# Patient Record
Sex: Male | Born: 1955
Health system: Southern US, Community
[De-identification: ages and names within clinical notes are randomized; demographics above are authoritative.]

## PROBLEM LIST (undated history)

## (undated) DIAGNOSIS — I639 Cerebral infarction, unspecified: Secondary | ICD-10-CM

## (undated) DIAGNOSIS — T4145XA Adverse effect of unspecified anesthetic, initial encounter: Secondary | ICD-10-CM

## (undated) DIAGNOSIS — G4730 Sleep apnea, unspecified: Secondary | ICD-10-CM

## (undated) DIAGNOSIS — J45909 Unspecified asthma, uncomplicated: Secondary | ICD-10-CM

## (undated) DIAGNOSIS — M1990 Unspecified osteoarthritis, unspecified site: Secondary | ICD-10-CM

## (undated) DIAGNOSIS — Z8719 Personal history of other diseases of the digestive system: Secondary | ICD-10-CM

## (undated) DIAGNOSIS — I10 Essential (primary) hypertension: Secondary | ICD-10-CM

## (undated) HISTORY — PX: ROTATOR CUFF REPAIR: SHX139

## (undated) HISTORY — DX: Cerebral infarction, unspecified: I63.9

## (undated) HISTORY — PX: WISDOM TOOTH EXTRACTION: SHX21

## (undated) HISTORY — DX: Essential (primary) hypertension: I10

---

## 2000-01-26 HISTORY — PX: NASAL SINUS SURGERY: SHX719

## 2000-08-30 ENCOUNTER — Encounter: Admission: RE | Admit: 2000-08-30 | Discharge: 2000-08-30 | Payer: Self-pay | Admitting: Family Medicine

## 2000-08-30 ENCOUNTER — Encounter: Payer: Self-pay | Admitting: Family Medicine

## 2000-09-20 ENCOUNTER — Encounter: Payer: Self-pay | Admitting: Family Medicine

## 2000-09-20 ENCOUNTER — Encounter: Admission: RE | Admit: 2000-09-20 | Discharge: 2000-09-20 | Payer: Self-pay | Admitting: Family Medicine

## 2000-11-10 ENCOUNTER — Ambulatory Visit (HOSPITAL_COMMUNITY): Admission: RE | Admit: 2000-11-10 | Discharge: 2000-11-10 | Payer: Self-pay | Admitting: Family Medicine

## 2000-11-10 ENCOUNTER — Encounter: Payer: Self-pay | Admitting: Family Medicine

## 2001-07-04 ENCOUNTER — Encounter: Payer: Self-pay | Admitting: Otolaryngology

## 2001-07-04 ENCOUNTER — Encounter: Admission: RE | Admit: 2001-07-04 | Discharge: 2001-07-04 | Payer: Self-pay | Admitting: Otolaryngology

## 2001-07-06 ENCOUNTER — Ambulatory Visit (HOSPITAL_BASED_OUTPATIENT_CLINIC_OR_DEPARTMENT_OTHER): Admission: RE | Admit: 2001-07-06 | Discharge: 2001-07-06 | Payer: Self-pay | Admitting: Otolaryngology

## 2001-07-07 ENCOUNTER — Emergency Department (HOSPITAL_COMMUNITY): Admission: EM | Admit: 2001-07-07 | Discharge: 2001-07-07 | Payer: Self-pay | Admitting: Emergency Medicine

## 2004-10-20 ENCOUNTER — Ambulatory Visit: Payer: Self-pay | Admitting: Gastroenterology

## 2004-10-28 ENCOUNTER — Ambulatory Visit: Payer: Self-pay | Admitting: Gastroenterology

## 2004-11-17 ENCOUNTER — Ambulatory Visit: Payer: Self-pay | Admitting: Gastroenterology

## 2004-12-04 ENCOUNTER — Ambulatory Visit: Payer: Self-pay | Admitting: Gastroenterology

## 2005-05-26 ENCOUNTER — Ambulatory Visit: Payer: Self-pay

## 2005-09-24 ENCOUNTER — Encounter: Admission: RE | Admit: 2005-09-24 | Discharge: 2005-09-24 | Payer: Self-pay | Admitting: Family Medicine

## 2009-01-30 DIAGNOSIS — G43909 Migraine, unspecified, not intractable, without status migrainosus: Secondary | ICD-10-CM | POA: Insufficient documentation

## 2009-01-30 DIAGNOSIS — I10 Essential (primary) hypertension: Secondary | ICD-10-CM | POA: Insufficient documentation

## 2009-01-30 DIAGNOSIS — G4733 Obstructive sleep apnea (adult) (pediatric): Secondary | ICD-10-CM | POA: Insufficient documentation

## 2012-02-18 ENCOUNTER — Other Ambulatory Visit: Payer: Self-pay | Admitting: Internal Medicine

## 2012-02-18 ENCOUNTER — Encounter: Payer: Self-pay | Admitting: Gastroenterology

## 2012-02-18 DIAGNOSIS — E669 Obesity, unspecified: Secondary | ICD-10-CM | POA: Insufficient documentation

## 2012-02-18 DIAGNOSIS — R1310 Dysphagia, unspecified: Secondary | ICD-10-CM

## 2012-02-22 ENCOUNTER — Other Ambulatory Visit (HOSPITAL_COMMUNITY): Payer: Self-pay | Admitting: Internal Medicine

## 2012-02-22 DIAGNOSIS — R1310 Dysphagia, unspecified: Secondary | ICD-10-CM

## 2012-02-24 ENCOUNTER — Ambulatory Visit (HOSPITAL_COMMUNITY)
Admission: RE | Admit: 2012-02-24 | Discharge: 2012-02-24 | Disposition: A | Discharge status: Home / Self Care | Payer: BC Managed Care – PPO | Source: Ambulatory Visit | Attending: Internal Medicine | Admitting: Internal Medicine

## 2012-02-24 ENCOUNTER — Other Ambulatory Visit: Payer: Self-pay

## 2012-02-24 DIAGNOSIS — R1310 Dysphagia, unspecified: Secondary | ICD-10-CM | POA: Insufficient documentation

## 2012-02-24 NOTE — Procedures (Signed)
Objective Swallowing Evaluation: Modified Barium Swallowing Study  Patient Details  Name: Tyler [name] MRN: [id number] Date of Birth: 08/06/1955  Today's Date: 02/24/2012 Time: 1400-1420 SLP Time Calculation (min): 20 min  Past Medical History: No past medical history on file. Past Surgical History: No past surgical history on file. HPI:  57 year old male referred by primary care physician for swallow evaluation due to c/o globus for approximately 4 months. Patient recently started on Nexium by primary care physician.      Assessment / Plan / Recommendation Clinical Impression  Dysphagia Diagnosis: Suspected primary esophageal dysphagia Clinical impression: Patient presents with a suspected primary esophageal dysphagia resulting in globus sensation despite what is a normal oropharyngeal swallow with all consistencies tested during today's study. Full airway protection and clearance of the pharynx noted. Defer further w/u to primary care physician or GI MD who patient states he has already made an appointment with. Noted a barium swallow ordered however per patient, has not yet been completed. Educated patient regarding variations between testing, rationale for testing to r/o possible dysfunction, and general safe swallowing precautions.     Treatment Recommendation  No treatment recommended at this time    Diet Recommendation Regular;Thin liquid   Liquid Administration via: Cup;Straw Medication Administration: Whole meds with liquid Supervision: Patient able to self feed Compensations: Slow rate;Small sips/bites Postural Changes and/or Swallow Maneuvers: Seated upright 90 degrees;Upright 30-60 min after meal    Other  Recommendations Recommended Consults: Consider GI evaluation;Consider esophageal assessment Oral Care Recommendations: Oral care BID   Follow Up Recommendations  None       Pertinent Vitals/Pain None reported        General HPI: 57 year old male referred  by primary care physician for swallow evaluation due to c/o globus for approximately 4 months. Patient recently started on Nexium by primary care physician.  Type of Study: Modified Barium Swallowing Study Reason for Referral: Objectively evaluate swallowing function Previous Swallow Assessment: none Diet Prior to this Study: Regular;Thin liquids Temperature Spikes Noted: No Respiratory Status: Room air History of Recent Intubation: No Behavior/Cognition: Alert;Cooperative;Pleasant mood Oral Cavity - Dentition: Adequate natural dentition Oral Motor / Sensory Function: Within functional limits Self-Feeding Abilities: Able to feed self Patient Positioning: Upright in chair Baseline Vocal Quality: Clear Volitional Cough: Strong Volitional Swallow: Able to elicit Anatomy: Within functional limits Pharyngeal Secretions: Not observed secondary MBS    Reason for Referral Objectively evaluate swallowing function   Oral Phase Oral Preparation/Oral Phase Oral Phase: WFL   Pharyngeal Phase Pharyngeal Phase Pharyngeal Phase: Within functional limits  Cervical Esophageal Phase    GO    Cervical Esophageal Phase Cervical Esophageal Phase: [facility]    Functional Assessment Tool Used: skilled clinician judgement Functional Limitations: Swallowing Swallow Current Status (W0981): 0 percent impaired, limited or restricted Swallow Goal Status (X9147): 0 percent impaired, limited or restricted Swallow Discharge Status [phone]): 0 percent impaired, limited or restricted   Ferdinand Lango MA, CCC-SLP [id number]  Tyler [name] 02/24/2012, 2:31 PM

## 2012-03-20 ENCOUNTER — Ambulatory Visit (INDEPENDENT_AMBULATORY_CARE_PROVIDER_SITE_OTHER): Payer: BC Managed Care – PPO | Admitting: Gastroenterology

## 2012-03-20 ENCOUNTER — Encounter: Payer: Self-pay | Admitting: Gastroenterology

## 2012-03-20 VITALS — BP 136/88 | HR 72 | Ht 70.0 in | Wt 228.4 lb

## 2012-03-20 DIAGNOSIS — R1310 Dysphagia, unspecified: Secondary | ICD-10-CM | POA: Insufficient documentation

## 2012-03-20 DIAGNOSIS — R195 Other fecal abnormalities: Secondary | ICD-10-CM

## 2012-03-20 NOTE — Assessment & Plan Note (Signed)
The patient undoubtedly has a esophageal stricture, most likely peptic.  Recommendations #1 upper endoscopy with dilatation as indicated #2 continue Nexium  Risks, alternatives, and complications of the procedure, including bleeding, perforation, and possible need for surgery, were explained to the patient.  Patient's questions were answered.

## 2012-03-20 NOTE — Assessment & Plan Note (Signed)
Hemoccult-positive stool could be related to active peptic disease. Lower GI bleeding source from polyps, AVMs or neoplasm are other considerations.  Recommendations #1 if upper endoscopy is not diagnostic for a GI bleeding source we will schedule colonoscopy #2 review recent CBC

## 2012-03-20 NOTE — Progress Notes (Signed)
History of Present Illness: Pleasant 57 year old white male referred at the request of Dr. Neale Burly for evaluation of dysphagia and Hemoccult-positive stool. Dysphagia has been an ongoing problem for 4-5 months. He's had what sounds like transient food impaction. With dysphagia he develops chest discomfort. Symptoms have slightly improved after starting Nexium but remain. Recent swallowing evaluation was negative. He has rare pyrosis.  Patient also tested Hemoccult positive. There is no history of melena or hematochezia.  Colonoscopy in 2006 for an iron deficiency anemia was negative.    Past Medical History  Diagnosis Date  . Hypertension    Past Surgical History  Procedure Laterality Date  . Wisdom tooth extraction    . Rotator cuff repair    . Nasal sinus surgery  2002   family history includes Aneurysm in his father and Heart failure in his mother. Current Outpatient Prescriptions  Medication Sig Dispense Refill  . amLODipine (NORVASC) 10 MG tablet Take 10 mg by mouth daily.      Marland Kitchen aspirin 81 MG tablet Take 81 mg by mouth daily.      Marland Kitchen esomeprazole (NEXIUM) 40 MG capsule Take 40 mg by mouth daily before breakfast.      . ferrous sulfate 325 (65 FE) MG tablet Take 325 mg by mouth daily with breakfast.      . fluticasone (VERAMYST) 27.5 MCG/SPRAY nasal spray Place 2 sprays into the nose daily.      Marland Kitchen lisinopril-hydrochlorothiazide (PRINZIDE,ZESTORETIC) 20-12.5 MG per tablet Take 1 tablet by mouth daily.      . Omega-3 Fatty Acids (FISH OIL) 1200 MG CAPS Take 2 capsules by mouth 2 (two) times daily.      . sildenafil (VIAGRA) 50 MG tablet Take 50 mg by mouth daily as needed for erectile dysfunction.      . Tamsulosin HCl (FLOMAX) 0.4 MG CAPS Take 0.4 mg by mouth daily.       No current facility-administered medications for this visit.   Allergies as of 03/20/2012  . (No Known Allergies)    reports that he has never smoked. He has never used smokeless tobacco. He reports that  drinks  alcohol. He reports that he does not use illicit drugs.     Review of Systems: Pertinent positive and negative review of systems were noted in the above HPI section. All other review of systems were otherwise negative.  Vital signs were reviewed in today's medical record Physical Exam: General: Well developed , well nourished, no acute distress Skin: anicteric Head: Normocephalic and atraumatic Eyes:  sclerae anicteric, EOMI Ears: Normal auditory acuity Mouth: No deformity or lesions Neck: Supple, no masses or thyromegaly Lungs: Clear throughout to auscultation Heart: Regular rate and rhythm; no murmurs, rubs or bruits Abdomen: Soft, non tender and non distended. No masses, hepatosplenomegaly or hernias noted. Normal Bowel sounds Rectal:deferred Musculoskeletal: Symmetrical with no gross deformities  Skin: No lesions on visible extremities Pulses:  Normal pulses noted Extremities: No clubbing, cyanosis, edema or deformities noted Neurological: Alert oriented x 4, grossly nonfocal Cervical Nodes:  No significant cervical adenopathy Inguinal Nodes: No significant inguinal adenopathy Psychological:  Alert and cooperative. Normal mood and affect

## 2012-03-20 NOTE — Patient Instructions (Addendum)
You have been scheduled for an endoscopy with propofol. Please follow written instructions given to you at your visit today. If you use inhalers (even only as needed) or a CPAP machine, please bring them with you on the day of your procedure. 

## 2012-04-11 ENCOUNTER — Encounter: Payer: BC Managed Care – PPO | Admitting: Gastroenterology

## 2012-04-13 ENCOUNTER — Telehealth: Payer: Self-pay | Admitting: *Deleted

## 2012-04-13 ENCOUNTER — Ambulatory Visit (AMBULATORY_SURGERY_CENTER): Payer: BC Managed Care – PPO | Admitting: Gastroenterology

## 2012-04-13 ENCOUNTER — Encounter: Payer: Self-pay | Admitting: Gastroenterology

## 2012-04-13 VITALS — BP 159/92 | HR 69 | Temp 97.6°F | Resp 21 | Ht 70.0 in | Wt 228.0 lb

## 2012-04-13 DIAGNOSIS — K222 Esophageal obstruction: Secondary | ICD-10-CM

## 2012-04-13 DIAGNOSIS — R1310 Dysphagia, unspecified: Secondary | ICD-10-CM

## 2012-04-13 MED ORDER — SODIUM CHLORIDE 0.9 % IV SOLN: 500.0000 mL | INTRAVENOUS | Status: DC

## 2012-04-13 NOTE — Op Note (Signed)
[facility] [address] [place], [address]   ENDOSCOPY PROCEDURE REPORT  PATIENT: Tyler [name], Tyler [name]  MR#: [id number] BIRTHDATE: 1955/05/14 , 56  yrs. old GENDER: Male ENDOSCOPIST: Louis Meckel, MD REFERRED BY:  Guerry Bruin, M.D. PROCEDURE DATE:  04/13/2012 PROCEDURE:  EGD, diagnostic and Maloney dilation of esophagus ASA CLASS:     Class II INDICATIONS:  Dysphagia.   Heme positive stool. MEDICATIONS: MAC sedation, administered by CRNA, propofol (Diprivan) 150mg  IV, and Simethicone 0.6cc PO TOPICAL ANESTHETIC: Cetacaine Spray  DESCRIPTION OF PROCEDURE: After the risks benefits and alternatives of the procedure were thoroughly explained, informed consent was obtained.  The [facility] [id number] endoscope was introduced through the mouth and advanced to the third portion of the duodenum. Without limitations.  The instrument was slowly withdrawn as the mucosa was fully examined.      At the GE junction there was an early esophageal stricture.  The 9.8 mm gastroscope traversed the area without resistance.  There was a small 2-3 cm sliding hiatal hernia.   The remainder of the upper endoscopy exam was otherwise normal.         The scope was then withdrawn from the patient.  A #52 Jerene Dilling dilator was passed with mild resistance. There was no heme.  COMPLICATIONS: There were no complications.  ENDOSCOPIC IMPRESSION: esophageal stricture-status post [facility] dilation  RECOMMENDATIONS: 1.  Repeat dilatation when necessary 2.  colonoscopy for workup of Hemoccult-positive stool REPEAT EXAM:  eSigned:  Louis Meckel, MD 04/13/2012 2:11 PM   CC:

## 2012-04-13 NOTE — Progress Notes (Signed)
Patient did not have preoperative order for IV antibiotic SSI prophylaxis. (G8918)  Patient did not experience any of the following events: a burn prior to discharge; a fall within the facility; wrong site/side/patient/procedure/implant event; or a hospital transfer or hospital admission upon discharge from the facility. (G8907)  

## 2012-04-13 NOTE — Telephone Encounter (Signed)
Left message for patient on both home and work phones asking if we might move appointment for EGD forward this afternoon. Requesting that patient arrive at 130 pm instead of 200 pm. No answer.

## 2012-04-13 NOTE — Progress Notes (Signed)
Report to pacu rn, vss, bbs=clear 

## 2012-04-13 NOTE — Patient Instructions (Addendum)

## 2012-04-14 ENCOUNTER — Telehealth: Payer: Self-pay | Admitting: *Deleted

## 2012-04-14 NOTE — Telephone Encounter (Signed)
  Follow up Call-  Call back number 04/13/2012  Post procedure Call Back phone  # [phone]  Permission to leave phone message Yes     Patient questions:  Do you have a fever, pain , or abdominal swelling? no Pain Score  0 *  Have you tolerated food without any problems? yes  Have you been able to return to your normal activities? yes  Do you have any questions about your discharge instructions: Diet   no Medications  no Follow up visit  no  Do you have questions or concerns about your Care? no  Actions: * If pain score is 4 or above: No action needed, pain <4.

## 2012-04-20 ENCOUNTER — Telehealth: Payer: Self-pay

## 2012-04-20 NOTE — Telephone Encounter (Signed)
Please see note below. Colonoscopy was not scheduled at the time of EGD, pt is supposed to call back and schedule that appt.

## 2012-04-20 NOTE — Telephone Encounter (Signed)
Message copied by Chrystie Nose on Thu Apr 20, 2012 10:42 AM ------      Message from: Jannifer Franklin      Created: Wed Apr 19, 2012  4:26 PM      Regarding: RE: Colonoscopy       Bonita Quin,            I reviewed this patient's AVS to see what the pt was told post procedure. The recovery nurse documented on the AVS that the patient was to call in May to schedule June Previsit and Procedure.  I spoke with the recovery nurse to find out why the patient was to call back.  The pt had already lost days to this episode and procedure and did not want to commit to a date before school was out.  He wanted to check his school calendar before making the appt.              Thanks for the follow up.      Harriett Sine                        ----- Message -----         From: Lily Lovings, RN         Sent: 04/17/2012  10:59 AM           To: Jannifer Franklin, RN      Subject: Colonoscopy                                              Harriett Sine,            This pt had an EGD on 04/13/12 and per report he was supposed to be scheduled for a colon and was not.            Thanks,      Bonita Quin       ------

## 2012-05-19 ENCOUNTER — Encounter: Payer: Self-pay | Admitting: Gastroenterology

## 2012-06-23 ENCOUNTER — Encounter: Payer: Self-pay | Admitting: Gastroenterology

## 2012-07-05 ENCOUNTER — Ambulatory Visit (AMBULATORY_SURGERY_CENTER): Payer: BC Managed Care – PPO | Admitting: *Deleted

## 2012-07-05 ENCOUNTER — Encounter: Payer: Self-pay | Admitting: Gastroenterology

## 2012-07-05 VITALS — Ht 70.0 in | Wt 232.2 lb

## 2012-07-05 DIAGNOSIS — K921 Melena: Secondary | ICD-10-CM

## 2012-07-05 MED ORDER — NA SULFATE-K SULFATE-MG SULF 17.5-3.13-1.6 GM/177ML PO SOLN: 1.0000 | Freq: Once | ORAL | Status: DC

## 2012-07-05 NOTE — Progress Notes (Signed)
Denies allergies to eggs or soy products. Denies complications with sedation or anesthesia. 

## 2012-08-03 ENCOUNTER — Ambulatory Visit (AMBULATORY_SURGERY_CENTER): Payer: BC Managed Care – PPO | Admitting: Gastroenterology

## 2012-08-03 ENCOUNTER — Encounter: Payer: Self-pay | Admitting: Gastroenterology

## 2012-08-03 VITALS — BP 137/80 | HR 62 | Temp 96.6°F | Resp 19 | Ht 70.0 in | Wt 232.0 lb

## 2012-08-03 DIAGNOSIS — D126 Benign neoplasm of colon, unspecified: Secondary | ICD-10-CM

## 2012-08-03 DIAGNOSIS — K5730 Diverticulosis of large intestine without perforation or abscess without bleeding: Secondary | ICD-10-CM

## 2012-08-03 DIAGNOSIS — K921 Melena: Secondary | ICD-10-CM

## 2012-08-03 MED ORDER — SODIUM CHLORIDE 0.9 % IV SOLN: 500.0000 mL | INTRAVENOUS | Status: DC

## 2012-08-03 NOTE — Progress Notes (Signed)
Patient did not experience any of the following events: a burn prior to discharge; a fall within the facility; wrong site/side/patient/procedure/implant event; or a hospital transfer or hospital admission upon discharge from the facility. (G8907) Patient did not have preoperative order for IV antibiotic SSI prophylaxis. (G8918)  

## 2012-08-03 NOTE — Progress Notes (Signed)
Pt stable to RR 

## 2012-08-03 NOTE — Patient Instructions (Addendum)
Discharge instructions given with verbal understanding. Handouts on polyps and diverticulosis given. Resume previous medications. Hemoccult cards given in Recovery Room. YOU HAD AN ENDOSCOPIC PROCEDURE TODAY AT THE [facility]: Refer to the procedure report that was given to you for any specific questions about what was found during the examination.  If the procedure report does not answer your questions, please call your gastroenterologist to clarify.  If you requested that your care partner not be given the details of your procedure findings, then the procedure report has been included in a sealed envelope for you to review at your convenience later.  YOU SHOULD EXPECT: Some feelings of bloating in the abdomen. Passage of more gas than usual.  Walking can help get rid of the air that was put into your GI tract during the procedure and reduce the bloating. If you had a lower endoscopy (such as a colonoscopy or flexible sigmoidoscopy) you may notice spotting of blood in your stool or on the toilet paper. If you underwent a bowel prep for your procedure, then you may not have a normal bowel movement for a few days.  DIET: Your first meal following the procedure should be a light meal and then it is ok to progress to your normal diet.  A half-sandwich or bowl of soup is an example of a good first meal.  Heavy or fried foods are harder to digest and may make you feel nauseous or bloated.  Likewise meals heavy in dairy and vegetables can cause extra gas to form and this can also increase the bloating.  Drink plenty of fluids but you should avoid alcoholic beverages for 24 hours.  ACTIVITY: Your care partner should take you home directly after the procedure.  You should plan to take it easy, moving slowly for the rest of the day.  You can resume normal activity the day after the procedure however you should NOT DRIVE or use heavy machinery for 24 hours (because of the sedation medicines used during  the test).    SYMPTOMS TO REPORT IMMEDIATELY: A gastroenterologist can be reached at any hour.  During normal business hours, 8:30 AM to 5:00 PM Monday through Friday, call [phone].  After hours and on weekends, please call the GI answering service at [phone] who will take a message and have the physician on call contact you.   Following lower endoscopy (colonoscopy or flexible sigmoidoscopy):  Excessive amounts of blood in the stool  Significant tenderness or worsening of abdominal pains  Swelling of the abdomen that is new, acute  Fever of 100F or higher  FOLLOW UP: If any biopsies were taken you will be contacted by phone or by letter within the next 1-3 weeks.  Call your gastroenterologist if you have not heard about the biopsies in 3 weeks.  Our staff will call the home number listed on your records the next business day following your procedure to check on you and address any questions or concerns that you may have at that time regarding the information given to you following your procedure. This is a courtesy call and so if there is no answer at the home number and we have not heard from you through the emergency physician on call, we will assume that you have returned to your regular daily activities without incident.  SIGNATURES/CONFIDENTIALITY: You and/or your care partner have signed paperwork which will be entered into your electronic medical record.  These signatures attest to the fact that that the  information above on your After Visit Summary has been reviewed and is understood.  Full responsibility of the confidentiality of this discharge information lies with you and/or your care-partner.

## 2012-08-03 NOTE — Op Note (Signed)
[facility] [address] [place], [address]   COLONOSCOPY PROCEDURE REPORT  PATIENT: Tyler [name], Tyler [name]  MR#: [id number] BIRTHDATE: 25-Feb-1955 , 56  yrs. old GENDER: Male ENDOSCOPIST: Louis Meckel, MD REFERRED XB:[id number] Tisovec, M.D. PROCEDURE DATE:  08/03/2012 PROCEDURE:   Colonoscopy with cold biopsy polypectomy ASA CLASS:   Class II INDICATIONS:heme-positive stool. MEDICATIONS: MAC sedation, administered by CRNA and propofol (Diprivan) 150mg  IV  DESCRIPTION OF PROCEDURE:   After the risks benefits and alternatives of the procedure were thoroughly explained, informed consent was obtained.  A digital rectal exam revealed no abnormalities of the rectum.   The [facility] [id number]  endoscope was introduced through the anus and advanced to the cecum, which was identified by both the appendix and ileocecal valve. No adverse events experienced.   The quality of the prep was excellent using Suprep  The instrument was then slowly withdrawn as the colon was fully examined.      COLON FINDINGS: A sessile polyp measuring 2 mm in size was found in the ascending colon.  A polypectomy was performed with cold forceps.   Moderate diverticulosis was noted in the sigmoid colon. The colon mucosa was otherwise normal.  Retroflexed views revealed no abnormalities. The time to cecum=1 minutes 55 seconds. Withdrawal time=7 minutes 40 seconds.  The scope was withdrawn and the procedure completed. COMPLICATIONS: There were no complications.  ENDOSCOPIC IMPRESSION: 1.   Sessile polyp measuring 2 mm in size was found in the ascending colon; polypectomy was performed with cold forceps 2.   Moderate diverticulosis was noted in the sigmoid colon 3.   The colon mucosa was otherwise normal  RECOMMENDATIONS: 1.  If the polyp(s) removed today are proven to be adenomatous (pre-cancerous) polyps, you will need a repeat colonoscopy in 5 years.  Otherwise you should continue to  follow colorectal cancer screening guidelines for "routine risk" patients with colonoscopy in 10 years.  You will receive a letter within 1-2 weeks with the results of your biopsy as well as final recommendations.  Please call my office if you have not received a letter after 3 weeks. 2.  Followup hemeoccults in 7-10 days   eSigned:  Louis Meckel, MD 08/03/2012 11:23 AM   cc:   PATIENT NAME:  Tyler [name], Tyler [name] MR#: [id number]

## 2012-08-03 NOTE — Progress Notes (Signed)
Called to room to assist during endoscopic procedure.  Patient ID and intended procedure confirmed with present staff. Received instructions for my participation in the procedure from the performing physician.  

## 2012-08-04 ENCOUNTER — Telehealth: Payer: Self-pay | Admitting: *Deleted

## 2012-08-04 NOTE — Telephone Encounter (Signed)
  Follow up Call-  Call back number 08/03/2012 04/13/2012  Post procedure Call Back phone  # [phone]  Permission to leave phone message Yes Yes     Patient questions:  Do you have a fever, pain , or abdominal swelling? no Pain Score  0 *  Have you tolerated food without any problems? yes  Have you been able to return to your normal activities? yes  Do you have any questions about your discharge instructions: Diet   no Medications  no Follow up visit  no  Do you have questions or concerns about your Care? no  Actions: * If pain score is 4 or above: No action needed, pain <4.

## 2012-08-09 ENCOUNTER — Telehealth: Payer: Self-pay | Admitting: *Deleted

## 2012-08-09 NOTE — Telephone Encounter (Signed)
Patient already was given the hemoccult cards to start on the 20th

## 2012-08-11 ENCOUNTER — Encounter: Payer: Self-pay | Admitting: Gastroenterology

## 2012-08-22 ENCOUNTER — Other Ambulatory Visit: Payer: Self-pay | Admitting: *Deleted

## 2012-08-22 ENCOUNTER — Other Ambulatory Visit: Payer: BC Managed Care – PPO

## 2012-08-22 ENCOUNTER — Other Ambulatory Visit (INDEPENDENT_AMBULATORY_CARE_PROVIDER_SITE_OTHER): Payer: BC Managed Care – PPO

## 2012-08-22 DIAGNOSIS — R194 Change in bowel habit: Secondary | ICD-10-CM

## 2012-08-22 DIAGNOSIS — R198 Other specified symptoms and signs involving the digestive system and abdomen: Secondary | ICD-10-CM

## 2012-08-22 DIAGNOSIS — D649 Anemia, unspecified: Secondary | ICD-10-CM

## 2012-08-22 LAB — HEMOCCULT SLIDES (X 3 CARDS)
OCCULT 3: NEGATIVE
OCCULT 4: NEGATIVE
OCCULT 5: NEGATIVE

## 2012-08-22 NOTE — Progress Notes (Signed)
Quick Note:  Please inform the patient that hemeoccults were normal and to continue current plan of action ______ 

## 2012-11-30 ENCOUNTER — Other Ambulatory Visit: Payer: Self-pay

## 2013-02-15 DIAGNOSIS — E291 Testicular hypofunction: Secondary | ICD-10-CM | POA: Insufficient documentation

## 2015-02-27 DIAGNOSIS — Z0000 Encounter for general adult medical examination without abnormal findings: Secondary | ICD-10-CM | POA: Insufficient documentation

## 2015-03-01 DIAGNOSIS — N529 Male erectile dysfunction, unspecified: Secondary | ICD-10-CM | POA: Insufficient documentation

## 2015-03-01 DIAGNOSIS — N401 Enlarged prostate with lower urinary tract symptoms: Secondary | ICD-10-CM | POA: Insufficient documentation

## 2016-01-23 DIAGNOSIS — J310 Chronic rhinitis: Secondary | ICD-10-CM | POA: Insufficient documentation

## 2016-01-23 DIAGNOSIS — J40 Bronchitis, not specified as acute or chronic: Secondary | ICD-10-CM | POA: Insufficient documentation

## 2016-04-08 DIAGNOSIS — J320 Chronic maxillary sinusitis: Secondary | ICD-10-CM | POA: Insufficient documentation

## 2016-05-10 DIAGNOSIS — J343 Hypertrophy of nasal turbinates: Secondary | ICD-10-CM | POA: Insufficient documentation

## 2016-07-09 ENCOUNTER — Ambulatory Visit (INDEPENDENT_AMBULATORY_CARE_PROVIDER_SITE_OTHER): Payer: 59 | Admitting: Pulmonary Disease

## 2016-07-09 ENCOUNTER — Encounter: Payer: Self-pay | Admitting: Pulmonary Disease

## 2016-07-09 DIAGNOSIS — R062 Wheezing: Secondary | ICD-10-CM

## 2016-07-09 DIAGNOSIS — R05 Cough: Secondary | ICD-10-CM

## 2016-07-09 DIAGNOSIS — R059 Cough, unspecified: Secondary | ICD-10-CM | POA: Insufficient documentation

## 2016-07-09 LAB — POCT EXHALED NITRIC OXIDE: FENO LEVEL (PPB): 36

## 2016-07-09 MED ORDER — BECLOMETHASONE DIPROPIONATE 80 MCG/ACT IN AERS: 1.0000 | INHALATION_SPRAY | Freq: Two times a day (BID) | RESPIRATORY_TRACT | 0 refills | Status: DC

## 2016-07-09 NOTE — Assessment & Plan Note (Addendum)
He is wheezing on exam and describes several months of chest tightness, congestion and audible wheezing when exerting himself. This is associated with some degree of shortness of breath and is partially relieved by albuterol.  These are symptoms of asthma and it may be that he has adult onset asthma. However, there is a differential diagnosis for this problem and it could be that laryngeal irritation is causing this as well.  Plan: We will treat him as if he has asthma by adding an inhaled corticosteroid, I've given him samples of Qvar to use 2 puffs twice a day no matter how he feels We will get an exhaled nitric oxide testing day We will order a full pulmonary function test for his next visit We will track down records of his chest x-ray from his primary care physician's office and try to get the images sent over here

## 2016-07-09 NOTE — Assessment & Plan Note (Addendum)
This is likely a multifactorial process. Clearly, with the lisinopril presently it is tempting to stop this medicine but he has significant wheezing on exam today so I think it's worthwhile to treat him as if this is due to asthma first before stopping that medicine. His sinus disease seems to be fairly well controlled right now and he reports no heartburn, though we know that 50% of patients who have acid reflux associated cough will report no heartburn or other GI symptoms.  Plan: Treat asthma as detailed above Voice rest was encouraged because I think there is a degree of cyclical cough at play here When he returns, if the cough is still present I would recommend: stopping lisinopril again, adding acid reflux treatment, and considering a CT scan of the chest if the lung function test is suggestive of any underlying lung disease

## 2016-07-09 NOTE — Patient Instructions (Signed)
We will arrange for a lung function test on the next visit Take Qvar 2 puffs twice a day no matter how you feel We will obtain the images from the chest x-ray you had at your doctor's office  For the cough I recommend: You need to try to suppress your cough to allow your larynx (voice box) to heal.  For three days don't talk, laugh, sing, or clear your throat. Do everything you can to suppress the cough during this time. Use hard candies (sugarless Jolly Ranchers) or non-mint or non-menthol containing cough drops during this time to soothe your throat.  Use a cough suppressant (Delsym or what I have prescribed you) around the clock during this time.  After three days, gradually increase the use of your voice and back off on the cough suppressants.  We will see you back in 3-4 weeks or sooner if needed

## 2016-07-09 NOTE — Progress Notes (Signed)
Subjective:    Patient ID: Tyler [name], male    DOB: [birth year], 61 y.o.   MRN: [id number]  Synopsis: referred in [id number] by Dr. Erik Obey for cough in the setting of chronic sinusitis.  HPI Chief Complaint  Patient presents with  . Pulmonary Consult    Per pt referred by Dr. Erik Obey, Pt here today c/o cough on going for months, he is coughing up white mucus, pt does c/o increase sob with exertion, says he has some trouble taking a deep breath, slight wheezing, slight chest tightness on right side, has some nasal congestion     Cough: > hew was diagnosed with bronchitis in November > has been seen nearly monthly since then for cough > has been treated with albuterol which helped the coughing fits some > the cough has changed since November: he notes a tight chest, productive cough, and a wheeze > the mornings and evenings are a little worse > he has been treated with multiple antibiotics, "sinus irrigation" > in the evenings he says that when he is sitting in the den watching TV he feels the cough more > when he is walking in the mornigns it brings on the mucus production  GERD: > has a history of esophageal stricture dilated by Dr. Deatra Ina > in the fall he was taking prevacid in the fall, none-now  Sinus disease: > he feels it is well controlled with flonase, anti-histamine > he has a deviated septum and has plans for surgery this summer  Dyspnea: > now when climbing a flight of stairs or hill > associated with wheezing > feels it when he is walking outside > never really had it this way in the past  His son had asthma as a child, he has a daughter with rheumatoid arthritis.  No childhood illnesses.  He smokes 2-3 cigars a month, for 40 years. Never smoked. Lifelong Pharmacist, hospital.    Past Medical History:  Diagnosis Date  . Hypertension      Family History  Problem Relation Age of Onset  . Aneurysm Father   . Heart failure Mother   . Colon cancer Neg Hx   .  Esophageal cancer Neg Hx   . Rectal cancer Neg Hx   . Stomach cancer Neg Hx      Social History   Social History  . Marital status: Married    Spouse name: N/A  . Number of children: 2  . Years of education: N/A   Occupational History  . Teacher    Social History Main Topics  . Smoking status: Never Smoker  . Smokeless tobacco: Never Used  . Alcohol use 3.0 oz/week    5 Glasses of wine per week  . Drug use: No  . Sexual activity: Not on file   Other Topics Concern  . Not on file   Social History Narrative  . No narrative on file     Allergies  Allergen Reactions  . Doxycycline     Other reaction(s): Other (See Comments) unknown  . Augmentin [Amoxicillin-Pot Clavulanate] Rash     Outpatient Medications Prior to Visit  Medication Sig Dispense Refill  . amLODipine (NORVASC) 10 MG tablet Take 10 mg by mouth daily.    Marland Kitchen aspirin 81 MG tablet Take 81 mg by mouth daily.    . ferrous sulfate 325 (65 FE) MG tablet Take 325 mg by mouth daily with breakfast.    . fluticasone (FLONASE) 50 MCG/ACT nasal spray     .  lisinopril-hydrochlorothiazide (PRINZIDE,ZESTORETIC) 20-12.5 MG per tablet Take 1 tablet by mouth daily.    . Omega-3 Fatty Acids (FISH OIL) 1200 MG CAPS Take 2 capsules by mouth 2 (two) times daily.    . sildenafil (VIAGRA) 50 MG tablet Take 50 mg by mouth daily as needed for erectile dysfunction.    . Tamsulosin HCl (FLOMAX) 0.4 MG CAPS Take 0.4 mg by mouth daily.    Marland Kitchen esomeprazole (NEXIUM) 40 MG capsule Take 40 mg by mouth daily before breakfast.     No facility-administered medications prior to visit.       Review of Systems  Constitutional: Negative.  Negative for fever and unexpected weight change.  HENT: Positive for congestion and sinus pressure. Negative for dental problem, ear pain, nosebleeds, postnasal drip, rhinorrhea, sneezing, sore throat and trouble swallowing.   Eyes: Negative.  Negative for redness and itching.  Respiratory: Positive for  cough, chest tightness, shortness of breath and wheezing.   Cardiovascular: Negative.  Negative for palpitations and leg swelling.  Gastrointestinal: Negative.  Negative for nausea and vomiting.  Endocrine: Negative.   Genitourinary: Negative.  Negative for dysuria.  Musculoskeletal: Negative.  Negative for joint swelling.  Skin: Negative.  Negative for rash.  Allergic/Immunologic: Negative.  Negative for environmental allergies, food allergies and immunocompromised state.  Neurological: Negative.  Negative for headaches.  Hematological: Negative.  Does not bruise/bleed easily.  Psychiatric/Behavioral: Negative.  Negative for dysphoric mood. The patient is not nervous/anxious.        Objective:   Physical Exam Vitals:   07/09/16 0856  BP: (!) 138/100  Pulse: 75  SpO2: 97%  Weight: 230 lb 12.8 oz (104.7 kg)  Height: 5\' 10"  (1.778 m)   Gen: well appearing, no acute distress HENT: NCAT, OP clear, neck supple without masses Eyes: PERRL, EOMi Lymph: no cervical lymphadenopathy PULM: wheezing upper and lower airways bilaterally CV: RRR, no mgr, no JVD GI: BS+, soft, nontender, no hsm Derm: no rash or skin breakdown MSK: normal bulk and tone Neuro: A&Ox4, CN II-XII intact, strength 5/5 in all 4 extremities Psyche: normal mood and affect   Imaging: 2014 barium swallow: worrisome for esophageal dysmotility   Record [place]:  05/2016 ENT records: seen by Dr. Erik Obey, has a history of esophageal dilation, he felt that his sinus disease was not the sole cause of his cough; he was noted to have stopped his lisinopril 3 months prior.       Assessment & Plan:  Wheezing He is wheezing on exam and describes several months of chest tightness, congestion and audible wheezing when exerting himself. This is associated with some degree of shortness of breath and is partially relieved by albuterol.  These are symptoms of asthma and it may be that he has adult onset asthma. However, there is  a differential diagnosis for this problem and it could be that laryngeal irritation is causing this as well.  Plan: We will treat him as if he has asthma by adding an inhaled corticosteroid, I've given him samples of Qvar to use 2 puffs twice a day no matter how he feels We will get an exhaled nitric oxide testing day We will order a full pulmonary function test for his next visit We will track down records of his chest x-ray from his primary care physician's office and try to get the images sent over here   Cough This is likely a multifactorial process. Clearly, with the lisinopril presently it is tempting to stop this medicine but he has  significant wheezing on exam today so I think it's worthwhile to treat him as if this is due to asthma first before stopping that medicine. His sinus disease seems to be fairly well controlled right now and he reports no heartburn, though we know that 50% of patients who have acid reflux associated cough will report no heartburn or other GI symptoms.  Plan: Treat asthma as detailed above Voice rest was encouraged because I think there is a degree of cyclical cough at play here When he returns, if the cough is still present I would recommend: stopping lisinopril again, adding acid reflux treatment, and considering a CT scan of the chest if the lung function test is suggestive of any underlying lung disease    Current Outpatient Prescriptions:  .  amLODipine (NORVASC) 10 MG tablet, Take 10 mg by mouth daily., Disp: , Rfl:  .  aspirin 81 MG tablet, Take 81 mg by mouth daily., Disp: , Rfl:  .  ferrous sulfate 325 (65 FE) MG tablet, Take 325 mg by mouth daily with breakfast., Disp: , Rfl:  .  fluticasone (FLONASE) 50 MCG/ACT nasal spray, , Disp: , Rfl:  .  lisinopril-hydrochlorothiazide (PRINZIDE,ZESTORETIC) 20-12.5 MG per tablet, Take 1 tablet by mouth daily., Disp: , Rfl:  .  montelukast (SINGULAIR) 10 MG tablet, Take 10 mg by mouth at bedtime., Disp: , Rfl:    .  Omega-3 Fatty Acids (FISH OIL) 1200 MG CAPS, Take 2 capsules by mouth 2 (two) times daily., Disp: , Rfl:  .  sildenafil (VIAGRA) 50 MG tablet, Take 50 mg by mouth daily as needed for erectile dysfunction., Disp: , Rfl:  .  Tamsulosin HCl (FLOMAX) 0.4 MG CAPS, Take 0.4 mg by mouth daily., Disp: , Rfl:  .  esomeprazole (NEXIUM) 40 MG capsule, Take 40 mg by mouth daily before breakfast., Disp: , Rfl:

## 2016-07-09 NOTE — Progress Notes (Signed)
Pt was properly shown how to use his qvar inhaler. Pt understood and had no further questions. He is aware to call us if he does

## 2016-08-05 ENCOUNTER — Ambulatory Visit (INDEPENDENT_AMBULATORY_CARE_PROVIDER_SITE_OTHER): Payer: 59 | Admitting: Pulmonary Disease

## 2016-08-05 ENCOUNTER — Other Ambulatory Visit (INDEPENDENT_AMBULATORY_CARE_PROVIDER_SITE_OTHER): Payer: 59

## 2016-08-05 ENCOUNTER — Ambulatory Visit (INDEPENDENT_AMBULATORY_CARE_PROVIDER_SITE_OTHER)
Admission: RE | Admit: 2016-08-05 | Discharge: 2016-08-05 | Disposition: A | Discharge status: Home / Self Care | Payer: 59 | Source: Ambulatory Visit | Attending: Adult Health | Admitting: Adult Health

## 2016-08-05 ENCOUNTER — Encounter: Payer: Self-pay | Admitting: Adult Health

## 2016-08-05 ENCOUNTER — Ambulatory Visit (INDEPENDENT_AMBULATORY_CARE_PROVIDER_SITE_OTHER): Payer: 59 | Admitting: Adult Health

## 2016-08-05 VITALS — BP 114/72 | HR 79 | Ht 70.0 in | Wt 228.0 lb

## 2016-08-05 DIAGNOSIS — R05 Cough: Secondary | ICD-10-CM

## 2016-08-05 DIAGNOSIS — R059 Cough, unspecified: Secondary | ICD-10-CM

## 2016-08-05 DIAGNOSIS — J4540 Moderate persistent asthma, uncomplicated: Secondary | ICD-10-CM | POA: Diagnosis not present

## 2016-08-05 DIAGNOSIS — J45909 Unspecified asthma, uncomplicated: Secondary | ICD-10-CM | POA: Insufficient documentation

## 2016-08-05 DIAGNOSIS — R062 Wheezing: Secondary | ICD-10-CM

## 2016-08-05 LAB — CBC WITH DIFFERENTIAL/PLATELET
BASOS ABS: 0.1 10*3/uL (ref 0.0–0.1)
Basophils Relative: 0.8 % (ref 0.0–3.0)
EOS PCT: 11.4 % — AB (ref 0.0–5.0)
Eosinophils Absolute: 0.7 10*3/uL (ref 0.0–0.7)
HEMATOCRIT: 41.5 % (ref 39.0–52.0)
HEMOGLOBIN: 14.3 g/dL (ref 13.0–17.0)
LYMPHS ABS: 1.7 10*3/uL (ref 0.7–4.0)
LYMPHS PCT: 27 % (ref 12.0–46.0)
MCHC: 34.5 g/dL (ref 30.0–36.0)
MCV: 83 fl (ref 78.0–100.0)
MONOS PCT: 9.5 % (ref 3.0–12.0)
Monocytes Absolute: 0.6 10*3/uL (ref 0.1–1.0)
Neutro Abs: 3.2 10*3/uL (ref 1.4–7.7)
Neutrophils Relative %: 51.3 % (ref 43.0–77.0)
Platelets: 198 10*3/uL (ref 150.0–400.0)
RBC: 4.99 Mil/uL (ref 4.22–5.81)
RDW: 14 % (ref 11.5–15.5)
WBC: 6.2 10*3/uL (ref 4.0–10.5)

## 2016-08-05 LAB — PULMONARY FUNCTION TEST
DL/VA % pred: 105 %
DL/VA: 4.89 ml/min/mmHg/L
DLCO UNC % PRED: 95 %
DLCO cor % pred: 89 %
DLCO cor: 28.9 ml/min/mmHg
DLCO unc: 31.02 ml/min/mmHg
FEF 25-75 PRE: 3.15 L/s
FEF 25-75 Post: 2.93 L/sec
FEF2575-%CHANGE-POST: -6 %
FEF2575-%Pred-Post: 101 %
FEF2575-%Pred-Pre: 108 %
FEV1-%CHANGE-POST: 0 %
FEV1-%PRED-PRE: 91 %
FEV1-%Pred-Post: 91 %
FEV1-PRE: 3.24 L
FEV1-Post: 3.24 L
FEV1FVC-%Change-Post: 8 %
FEV1FVC-%Pred-Pre: 104 %
FEV6-%Change-Post: -8 %
FEV6-%PRED-POST: 84 %
FEV6-%Pred-Pre: 92 %
FEV6-POST: 3.81 L
FEV6-PRE: 4.16 L
FEV6FVC-%PRED-PRE: 105 %
FEV6FVC-%Pred-Post: 105 %
FVC-%CHANGE-POST: -7 %
FVC-%PRED-POST: 81 %
FVC-%PRED-PRE: 88 %
FVC-POST: 3.84 L
FVC-PRE: 4.16 L
PRE FEV6/FVC RATIO: 100 %
Post FEV1/FVC ratio: 84 %
Post FEV6/FVC ratio: 100 %
Pre FEV1/FVC ratio: 78 %
RV % PRED: 114 %
RV: 2.61 L
TLC % pred: 97 %
TLC: 6.84 L

## 2016-08-05 MED ORDER — BUDESONIDE-FORMOTEROL FUMARATE 80-4.5 MCG/ACT IN AERO: 2.0000 | INHALATION_SPRAY | Freq: Two times a day (BID) | RESPIRATORY_TRACT | 3 refills | Status: DC

## 2016-08-05 MED ORDER — LEVALBUTEROL HCL 0.63 MG/3ML IN NEBU
0.6300 mg | INHALATION_SOLUTION | Freq: Once | RESPIRATORY_TRACT | Status: AC
  Administered 2016-08-05: 0.63 mg via RESPIRATORY_TRACT

## 2016-08-05 MED ORDER — BENZONATATE 200 MG PO CAPS: 200.0000 mg | ORAL_CAPSULE | Freq: Three times a day (TID) | ORAL | 1 refills | Status: DC | PRN

## 2016-08-05 NOTE — Assessment & Plan Note (Signed)
Suspect this is Asthma with sinus , ACE inhibitor , ?GERD triggers  Check allergy profile /IgE  Would change ACE as it may play a part in the chronic cough and will not be able to fully assess if cough is improving while on this medication.  Control for triggers along with cough  Check cxr .   Plan  Patient Instructions  Change QVAR to Symbicort 2 puffs Twice daily  , rinse after use.  Chest xray and labs today .  Mucinex Twice daily  As needed  Cough/congestion  Delsym Twice daily  As needed  Cough .  Tessalon Three times a day  As needed  Cough  Saline rinses As needed  Continue on Flonase  Begin Zyrtec 10mg  At bedtime   Begin Pepcid 20mg  At bedtime   Begin Nexium 40mg  daily before meal  Please discuss with your Primary MD that Lisinopril may be aggravating your cough .  follow up Dr. Lake Bells in 2 months and .As needed   Please contact office for sooner follow up if symptoms do not improve or worsen or seek emergency care

## 2016-08-05 NOTE — Progress Notes (Signed)
PFT done today. 

## 2016-08-05 NOTE — Assessment & Plan Note (Signed)
Chronic cough   Plan  Patient Instructions  Change QVAR to Symbicort 2 puffs Twice daily  , rinse after use.  Chest xray and labs today .  Mucinex Twice daily  As needed  Cough/congestion  Delsym Twice daily  As needed  Cough .  Tessalon Three times a day  As needed  Cough  Saline rinses As needed  Continue on Flonase  Begin Zyrtec 10mg  At bedtime   Begin Pepcid 20mg  At bedtime   Begin Nexium 40mg  daily before meal  Please discuss with your Primary MD that Lisinopril may be aggravating your cough .  follow up Dr. Lake Bells in 2 months and .As needed   Please contact office for sooner follow up if symptoms do not improve or worsen or seek emergency care

## 2016-08-05 NOTE — Progress Notes (Signed)
@Patient  ID: Tyler [name], male    DOB: 1955/02/11, 61 y.o.   MRN: [id number]  Chief Complaint  Patient presents with  . Follow-up    Cough    Referring provider: Haywood Pao, MD  HPI: 61 yo male never smoker Seen for pulmonary consult 07/09/2016 for cough and wheezing Does smoke cigars 2-3 a month  08/05/2016 Follow up : Asthma  Patient returns for a one-month follow-up. Patient was seen for a pulmonary consult last month for persistent cough and wheezing. Patient complains over the last 6 months he's had a persistent cough, wheezing, and intermittent shortness of breath. He was initially treated for bronchitis with antibiotics and steroids. He had some improvement but symptoms persisted. Patient is following with ENT for chronic sinusitis and has upcoming sinus surgery next month.. Exhaled nitric oxide testing last office visit was 28. Patient was started on Qvar. Patient feels that it may have helped some. He continues to have persistent cough, intermittent wheezing. She is on ACE inhibitor and says that this was stopped for about a month months ago that cough did not resolve.. Patient was set up for pulmonary function test that showed normal lung function with an FEV1 at 91%, ratio 84, FVC 81%. DLCO 95%, no significant bronchodilator response. Has tried singuliar in past with no perceived benefit    Allergies  Allergen Reactions  . Doxycycline     Other reaction(s): Other (See Comments) unknown  . Augmentin [Amoxicillin-Pot Clavulanate] Rash    Immunization History  Administered Date(s) Administered  . Influenza-Unspecified 10/27/2015    Past Medical History:  Diagnosis Date  . Hypertension     Tobacco History: History  Smoking Status  . Never Smoker  Smokeless Tobacco  . Never Used   Counseling given: Not Answered   Outpatient Encounter Prescriptions as of 08/05/2016  Medication Sig  . amLODipine (NORVASC) 10 MG tablet Take 10 mg by mouth  daily.  Marland Kitchen aspirin 81 MG tablet Take 81 mg by mouth daily.  . beclomethasone (QVAR) 80 MCG/ACT inhaler Inhale 1 puff into the lungs 2 (two) times daily.  . ferrous sulfate 325 (65 FE) MG tablet Take 325 mg by mouth daily with breakfast.  . fluticasone (FLONASE) 50 MCG/ACT nasal spray   . lisinopril-hydrochlorothiazide (PRINZIDE,ZESTORETIC) 20-12.5 MG per tablet Take 1 tablet by mouth daily.  . Omega-3 Fatty Acids (FISH OIL) 1200 MG CAPS Take 2 capsules by mouth 2 (two) times daily.  . sildenafil (VIAGRA) 50 MG tablet Take 50 mg by mouth daily as needed for erectile dysfunction.  . Tamsulosin HCl (FLOMAX) 0.4 MG CAPS Take 0.4 mg by mouth daily.  . benzonatate (TESSALON) 200 MG capsule Take 1 capsule (200 mg total) by mouth 3 (three) times daily as needed for cough.  . budesonide-formoterol (SYMBICORT) 80-4.5 MCG/ACT inhaler Inhale 2 puffs into the lungs 2 (two) times daily.  Marland Kitchen esomeprazole (NEXIUM) 40 MG capsule Take 40 mg by mouth daily before breakfast.  . montelukast (SINGULAIR) 10 MG tablet Take 10 mg by mouth at bedtime.  . [EXPIRED] levalbuterol (XOPENEX) nebulizer solution 0.63 mg    No facility-administered encounter medications on file as of 08/05/2016.      Review of Systems  Constitutional:   No  weight loss, night sweats,  Fevers, chills, fatigue, or  lassitude.  HEENT:   No headaches,  Difficulty swallowing,  Tooth/dental problems, or  Sore throat,                No sneezing,  itching, ear ache,  +nasal congestion, post nasal drip,   CV:  No chest pain,  Orthopnea, PND, swelling in lower extremities, anasarca, dizziness, palpitations, syncope.   GI  No heartburn, indigestion, abdominal pain, nausea, vomiting, diarrhea, change in bowel habits, loss of appetite, bloody stools.   Resp:   No chest wall deformity  Skin: no rash or lesions.  GU: no dysuria, change in color of urine, no urgency or frequency.  No flank pain, no hematuria   MS:  No joint pain or swelling.  No  decreased range of motion.  No back pain.    Physical Exam  BP 114/72 (BP Location: Left Arm, Cuff Size: Normal)   Pulse 79   Ht 5\' 10"  (1.778 m)   Wt 228 lb (103.4 kg)   SpO2 98%   BMI 32.71 kg/m   GEN: A/Ox3; pleasant , NAD , obese    HEENT:  [place]/AT,  EACs-clear, TMs-wnl, NOSE-clear, THROAT-clear, no lesions, no postnasal drip or exudate noted.   NECK:  Supple w/ fair ROM; no JVD; normal carotid impulses w/o bruits; no thyromegaly or nodules palpated; no lymphadenopathy.    RESP  Few trace rhonchi ,  no accessory muscle use, no dullness to percussion  CARD:  RRR, no m/r/g, no peripheral edema, pulses intact, no cyanosis or clubbing.  GI:   Soft & nt; nml bowel sounds; no organomegaly or masses detected.   Musco: Warm bil, no deformities or joint swelling noted.   Neuro: alert, no focal deficits noted.    Skin: Warm, no lesions or rashes    Lab Results:  CBC    Component Value Date/Time   WBC 6.2 08/05/2016 1700   RBC 4.99 08/05/2016 1700   HGB 14.3 08/05/2016 1700   HCT 41.5 08/05/2016 1700   PLT 198.0 08/05/2016 1700   MCV 83.0 08/05/2016 1700   MCHC 34.5 08/05/2016 1700   RDW 14.0 08/05/2016 1700   LYMPHSABS 1.7 08/05/2016 1700   MONOABS 0.6 08/05/2016 1700   EOSABS 0.7 08/05/2016 1700   BASOSABS 0.1 08/05/2016 1700    BMET No results found for: NA, K, CL, CO2, GLUCOSE, BUN, CREATININE, CALCIUM, GFRNONAA, GFRAA  BNP No results found for: BNP  ProBNP No results found for: PROBNP  Imaging: Dg Chest 2 View  Result Date: 08/05/2016 CLINICAL DATA:  Intermittent cough and wheezing for 9 months EXAM: CHEST  2 VIEW COMPARISON:  None FINDINGS: Normal heart size, mediastinal contours, and pulmonary vascularity. Lungs clear. No pulmonary infiltrate, pleural effusion or pneumothorax. Osseous structures unremarkable. IMPRESSION: No acute abnormalities. Electronically Signed   By: Lavonia Dana M.D.   On: 08/05/2016 17:14     Assessment & Plan:    Asthma Suspect this is Asthma with sinus , ACE inhibitor , ?GERD triggers  Check allergy profile /IgE  Would change ACE as it may play a part in the chronic cough and will not be able to fully assess if cough is improving while on this medication.  Control for triggers along with cough  Check cxr .   Plan  Patient Instructions  Change QVAR to Symbicort 2 puffs Twice daily  , rinse after use.  Chest xray and labs today .  Mucinex Twice daily  As needed  Cough/congestion  Delsym Twice daily  As needed  Cough .  Tessalon Three times a day  As needed  Cough  Saline rinses As needed  Continue on Flonase  Begin Zyrtec 10mg  At bedtime   Begin Pepcid 20mg   At bedtime   Begin Nexium 40mg  daily before meal  Please discuss with your Primary MD that Lisinopril may be aggravating your cough .  follow up Dr. Lake Bells in 2 months and .As needed   Please contact office for sooner follow up if symptoms do not improve or worsen or seek emergency care        Cough Chronic cough   Plan  Patient Instructions  Change QVAR to Symbicort 2 puffs Twice daily  , rinse after use.  Chest xray and labs today .  Mucinex Twice daily  As needed  Cough/congestion  Delsym Twice daily  As needed  Cough .  Tessalon Three times a day  As needed  Cough  Saline rinses As needed  Continue on Flonase  Begin Zyrtec 10mg  At bedtime   Begin Pepcid 20mg  At bedtime   Begin Nexium 40mg  daily before meal  Please discuss with your Primary MD that Lisinopril may be aggravating your cough .  follow up Dr. Lake Bells in 2 months and .As needed   Please contact office for sooner follow up if symptoms do not improve or worsen or seek emergency care         Rexene Edison, NP 08/05/2016

## 2016-08-05 NOTE — Patient Instructions (Addendum)
Change QVAR to Symbicort 2 puffs Twice daily  , rinse after use.  Chest xray and labs today .  Mucinex Twice daily  As needed  Cough/congestion  Delsym Twice daily  As needed  Cough .  Tessalon Three times a day  As needed  Cough  Saline rinses As needed  Continue on Flonase  Begin Zyrtec 10mg  At bedtime   Begin Pepcid 20mg  At bedtime   Begin Nexium 40mg  daily before meal  Please discuss with your Primary MD that Lisinopril may be aggravating your cough .  follow up Dr. Lake Bells in 2 months and .As needed   Please contact office for sooner follow up if symptoms do not improve or worsen or seek emergency care

## 2016-08-06 ENCOUNTER — Telehealth: Payer: Self-pay | Admitting: Adult Health

## 2016-08-06 LAB — RESPIRATORY ALLERGY PROFILE REGION II ~~LOC~~
ALLERGEN, A. ALTERNATA, M6: 0.29 kU/L — AB
Allergen, Cedar tree, t12: <0.1 kU/L
Allergen, Comm Silver Birch, t9: <0.1 kU/L
Allergen, Cottonwood, t14: <0.1 kU/L
Allergen, Mouse Urine Protein, e78: <0.1 kU/L
Allergen, Oak,t7: <0.1 kU/L
Allergen, P. notatum, m1: <0.1 kU/L
Aspergillus fumigatus, m3: <0.1 kU/L
Bermuda Grass: <0.1 kU/L
Cat Dander: <0.1 kU/L
Common Ragweed: <0.1 kU/L
IgE (Immunoglobulin E), Serum: 5 kU/L (ref <115)
Rough Pigweed  IgE: <0.1 kU/L

## 2016-08-06 NOTE — Progress Notes (Signed)
Reviewed, agree 

## 2016-08-06 NOTE — Telephone Encounter (Signed)
Lung are clear  Cont w/ ov recs  Please contact office for sooner follow up if symptoms do not improve or worsen or seek emergency care   Called and spoke with pt and he is aware of results per TP.  Nothing further is needed.

## 2016-08-06 NOTE — Progress Notes (Signed)
Spoke with pt about labwork he had done. Told pt that recommendation was to start on zyrtec and might need to be started back on singulair. Pt expressed understanding and stated that he just got zyrtec today and was going to start taking it today. Nothing further needed at this time.

## 2016-08-16 NOTE — Progress Notes (Addendum)
PZP:SUGAYGE, Fransico Him, MD  Cardiologist: pt denies   EKG: pt denies past year  Stress test: 5+ years ago per pt  ECHO: pt denies ever  Cardiac Cath: pt denies ever  Chest x-ray: 08/05/16 in [facility]

## 2016-08-16 NOTE — Pre-Procedure Instructions (Signed)
Tyler [name]  08/16/2016      [id number] - [place], [place] - [place] [place] [address] Phone: [phone] Fax: [phone]  [facility] [place], [address] AT [facility] OF [place] & [place] [address] [place] [address] Phone: [phone] Fax: [phone]    Your procedure is scheduled on August 19, 2016.  Report to [facility] Admitting at 530 AM.  Call this number if you have problems the morning of surgery:  [phone]   Remember:  Do not eat food or drink liquids after midnight.  Take these medicines the morning of surgery with A SIP OF WATER amlodipine (norvasc), symbicort inhaler <bring inhaler with you to hospital>, cetirizine (zyrtec), esomepreazole (nexium), famotidine (pepcid), fluticasone (flonase), tamsulosin (flomax).  7 days prior to surgery STOP taking any diclofenac (voltaren) Aspirin, Aleve, Naproxen, Ibuprofen, Motrin, Advil, Goody's, BC's, all herbal medications, fish oil, and all vitamins   Do not wear jewelry, make-up or nail polish.  Do not wear lotions, powders, or perfumes, or deoderant.             Men may shave face and neck.  Do not bring valuables to the hospital.  [facility] is not responsible for any belongings or valuables.  Contacts, dentures or bridgework may not be worn into surgery.  Leave your suitcase in the car.  After surgery it may be brought to your room.  For patients admitted to the hospital, discharge time will be determined by your treatment team.  Patients discharged the day of surgery will not be allowed to drive home.   Special instructions:   [facility]- Preparing For Surgery  Before surgery, you can play an important role. Because skin is not sterile, your skin needs to be as free of germs as possible. You can reduce the number of germs on your skin by washing with CHG (chlorahexidine  gluconate) Soap before surgery.  CHG is an antiseptic cleaner which kills germs and bonds with the skin to continue killing germs even after washing.  Please do not use if you have an allergy to CHG or antibacterial soaps. If your skin becomes reddened/irritated stop using the CHG.  Do not shave (including legs and underarms) for at least 48 hours prior to first CHG shower. It is OK to shave your face.  Please follow these instructions carefully.   1. Shower the NIGHT BEFORE SURGERY and the MORNING OF SURGERY with CHG.   2. If you chose to wash your hair, wash your hair first as usual with your normal shampoo.  3. After you shampoo, rinse your hair and body thoroughly to remove the shampoo.  4. Use CHG as you would any other liquid soap. You can apply CHG directly to the skin and wash gently with a scrungie or a clean washcloth.   5. Apply the CHG Soap to your body ONLY FROM THE NECK DOWN.  Do not use on open wounds or open sores. Avoid contact with your eyes, ears, mouth and genitals (private parts). Wash genitals (private parts) with your normal soap.  6. Wash thoroughly, paying special attention to the area where your surgery will be performed.  7. Thoroughly rinse your body with warm water from the neck down.  8. DO NOT shower/wash with your normal soap after using and rinsing off the CHG Soap.  9. Pat yourself dry with a CLEAN TOWEL.   10.  [place]   11. Place CLEAN SHEETS on your bed the night of your first shower and DO NOT SLEEP WITH PETS.    Day of Surgery: Do not apply any deodorants/lotions. Please wear clean clothes to the hospital/surgery center.      Please read over the following fact sheets that you were given. Pain Booklet, Coughing and Deep Breathing and Surgical Site Infection Prevention

## 2016-08-16 NOTE — H&P (Signed)
Otolaryngology Clinic Note  HPI:    Tyler [name] is a 61 y.o. male patient of Lafe Garin, MD for preoperative evaluation.  We are preparing for septoplasty, SMR inferior turbinates, and left endoscopic antrostomy and anterior ethmoidectomy later this week.  I discussed the procedure in detail including risks and complications.  Questions were answered and informed consent was obtained.  He has recently worked with Dr. Lake Bells.  Chest x-ray is clear.  Pulmonary function testing was normal.  He did test positive for some sort of outdoor mold.  He is off lisinopril now and on several over-the-counter agents to help with his cough. PMH/Meds/All/SocHx/FamHx/ROS:   Past Medical History      Past Medical History:  Diagnosis Date  . Hypertension   . Obstructive sleep apnea       Past Surgical History       Past Surgical History:  Procedure Laterality Date  . ESOPHAGEAL DILATION    . NOSE SURGERY    . SHOULDER SURGERY    . WISDOM TOOTH EXTRACTION        No family history of bleeding disorders, wound healing problems or difficulty with anesthesia.   Social History  Social History        Social History  . Marital status: Married    Spouse name: N/A  . Number of children: N/A  . Years of education: N/A      Occupational History  . Not on file.        Social History Main Topics  . Smoking status: Light Tobacco Smoker    Types: Cigars  . Smokeless tobacco: Never Used  . Alcohol use Yes  . Drug use: Unknown  . Sexual activity: Not on file       Other Topics Concern  . Not on file      Social History Narrative  . No narrative on file       Current Outpatient Prescriptions:  .  amLODIPine (NORVASC) 10 MG tablet, Take by mouth., Disp: , Rfl:  .  aspirin-calcium carbonate 81 mg-300 mg calcium(777 mg) Tab tablet *ANTIPLATELET*, Take by mouth., Disp: , Rfl:  .  beclomethasone (QVAR) 80 mcg/actuation inhaler, Inhale 1 puff  into the lungs., Disp: , Rfl:  .  cefdinir (OMNICEF) 300 MG capsule, Take 1 capsule (300 mg total) by mouth 2 times daily., Disp: 20 capsule, Rfl: 0 .  cephalexin 500 mg tablet, Take 500 mg by mouth 4 times daily for 11 days., Disp: 44 tablet, Rfl: 0 .  clindamycin (CLEOCIN HCL) 150 MG capsule, 3 times daily with meals, Disp: 90 capsule, Rfl: 0 .  FERROUS SULFATE (IRON ORAL), Take by mouth., Disp: , Rfl:  .  fluticasone (FLONASE) 50 mcg/actuation nasal spray, , Disp: , Rfl:  .  HYDROcodone-acetaminophen (NORCO) 5-325 mg per tablet, Take 1-2 tablets by mouth every 6 (six) hours as needed for up to 5 days., Disp: 30 tablet, Rfl: 0 .  lisinopril-hydrochlorothiazide (PRINZIDE,ZESTORETIC) 20-12.5 mg per tablet, Take 1 tablet by mouth., Disp: , Rfl:  .  LORATADINE (CLARITIN ORAL), Take by mouth., Disp: , Rfl:  .  sildenafil (VIAGRA) 50 MG tablet, Take by mouth., Disp: , Rfl:  .  sulfamethoxazole-trimethoprim (BACTRIM DS) 800-160 mg per tablet, Take 1 tablet by mouth 2 times daily., Disp: 40 tablet, Rfl: 1 .  tamsulosin (FLOMAX) 0.4 mg Cp24 capsule, , Disp: , Rfl: 0  A complete ROS was performed with pertinent positives/negatives noted in the HPI. The remainder of the ROS  are negative.    Physical Exam:    There were no vitals taken for this visit. He is stocky but trim and healthy.  Mental status is appropriate.  He hears well in conversational speech.  Vision is intact in each eye independently with full range of conjugate vision.  Ears are clear with normal drums.  Anterior nose shows a severe anterior rightward septal deviation and a posterior left chondral ethmoid spur.  No polyps.  Oral cavity is clear with teeth in good repair.  Oropharynx is clear.  Neck unremarkable. Lungs: Clear to auscultation Heart: Regular rate and rhythm without murmurs Abdomen: Soft, active Extremities: Normal configuration Neurologic: Symmetric, grossly intact.      Impression & Plans:   Nasal septal  deviation and hypertrophic inferior turbinates with obstruction.  Recurrent left maxillary, ethmoid, and frontal sinusitis.  Plan: We will proceed with surgery.  I gave him prescriptions today for Keflex and Vicodin.  He will stay overnight one night given his known sleep apnea.  Will remove packing the following morning and discharge him to home in care of family.  I will see him back 11 days postop for removal of nasal septal splints.  I gave him nasal hygiene instructions.   Lilyan Gilford, MD  06/04/209

## 2016-08-17 ENCOUNTER — Encounter (HOSPITAL_COMMUNITY): Payer: Self-pay

## 2016-08-17 ENCOUNTER — Encounter (HOSPITAL_COMMUNITY)
Admission: RE | Admit: 2016-08-17 | Discharge: 2016-08-17 | Disposition: A | Discharge status: Home / Self Care | Payer: 59 | Source: Ambulatory Visit | Attending: Otolaryngology | Admitting: Otolaryngology

## 2016-08-17 DIAGNOSIS — J343 Hypertrophy of nasal turbinates: Secondary | ICD-10-CM | POA: Diagnosis not present

## 2016-08-17 DIAGNOSIS — Z79899 Other long term (current) drug therapy: Secondary | ICD-10-CM | POA: Diagnosis not present

## 2016-08-17 DIAGNOSIS — F17290 Nicotine dependence, other tobacco product, uncomplicated: Secondary | ICD-10-CM | POA: Diagnosis not present

## 2016-08-17 DIAGNOSIS — Z7951 Long term (current) use of inhaled steroids: Secondary | ICD-10-CM | POA: Diagnosis not present

## 2016-08-17 DIAGNOSIS — J342 Deviated nasal septum: Secondary | ICD-10-CM | POA: Diagnosis present

## 2016-08-17 DIAGNOSIS — J328 Other chronic sinusitis: Secondary | ICD-10-CM | POA: Diagnosis not present

## 2016-08-17 DIAGNOSIS — I10 Essential (primary) hypertension: Secondary | ICD-10-CM | POA: Diagnosis not present

## 2016-08-17 DIAGNOSIS — T8859XA Other complications of anesthesia, initial encounter: Secondary | ICD-10-CM

## 2016-08-17 DIAGNOSIS — G4733 Obstructive sleep apnea (adult) (pediatric): Secondary | ICD-10-CM | POA: Diagnosis not present

## 2016-08-17 HISTORY — DX: Unspecified osteoarthritis, unspecified site: M19.90

## 2016-08-17 HISTORY — DX: Other complications of anesthesia, initial encounter: T88.59XA

## 2016-08-17 HISTORY — DX: Unspecified asthma, uncomplicated: J45.909

## 2016-08-17 HISTORY — DX: Sleep apnea, unspecified: G47.30

## 2016-08-17 HISTORY — DX: Adverse effect of unspecified anesthetic, initial encounter: T41.45XA

## 2016-08-17 HISTORY — DX: Personal history of other diseases of the digestive system: Z87.19

## 2016-08-17 LAB — BASIC METABOLIC PANEL
Anion gap: 8 (ref 5–15)
BUN: 12 mg/dL (ref 6–20)
CALCIUM: 8.8 mg/dL — AB (ref 8.9–10.3)
CO2: 28 mmol/L (ref 22–32)
CREATININE: 0.81 mg/dL (ref 0.61–1.24)
Chloride: 106 mmol/L (ref 101–111)
GFR calc Af Amer: >60 mL/min (ref >60)
GFR calc non Af Amer: >60 mL/min (ref >60)
GLUCOSE: 103 mg/dL — AB (ref 65–99)
Potassium: 3.3 mmol/L — ABNORMAL LOW (ref 3.5–5.1)
Sodium: 142 mmol/L (ref 135–145)

## 2016-08-17 LAB — CBC
HCT: 41.5 % (ref 39.0–52.0)
Hemoglobin: 14.3 g/dL (ref 13.0–17.0)
MCH: 27.9 pg (ref 26.0–34.0)
MCHC: 34.5 g/dL (ref 30.0–36.0)
MCV: 80.9 fL (ref 78.0–100.0)
PLATELETS: 204 10*3/uL (ref 150–400)
RBC: 5.13 MIL/uL (ref 4.22–5.81)
RDW: 13 % (ref 11.5–15.5)
WBC: 5.1 10*3/uL (ref 4.0–10.5)

## 2016-08-18 MED ORDER — DEXTROSE 5 % IV SOLN
3.0000 g | INTRAVENOUS | Status: AC
  Administered 2016-08-19: 3 g via INTRAVENOUS
  Filled 2016-08-18: qty 3

## 2016-08-18 NOTE — Anesthesia Preprocedure Evaluation (Addendum)
Anesthesia Evaluation  Patient identified by MRN, date of birth, ID band Patient awake    Reviewed: Allergy & Precautions, H&P , NPO status , Patient's Chart, lab work & pertinent test results, reviewed documented beta blocker date and time   Airway Mallampati: I  TM Distance: >3 FB Neck ROM: full    Dental no notable dental hx. (+) Teeth Intact, Dental Advisory Given   Pulmonary    Pulmonary exam normal breath sounds clear to auscultation       Cardiovascular hypertension, Pt. on medications  Rhythm:regular Rate:Normal     Neuro/Psych    GI/Hepatic   Endo/Other    Renal/GU      Musculoskeletal   Abdominal   Peds  Hematology   Anesthesia Other Findings   Reproductive/Obstetrics                           Anesthesia Physical Anesthesia Plan  ASA: II  Anesthesia Plan: General   Post-op Pain Management:    Induction: Intravenous  PONV Risk Score and Plan: 3 and Ondansetron, Dexamethasone, Propofol and Midazolam  Airway Management Planned: Oral ETT  Additional Equipment: None  Intra-op Plan:   Post-operative Plan: Extubation in OR  Informed Consent: I have reviewed the patients History and Physical, chart, labs and discussed the procedure including the risks, benefits and alternatives for the proposed anesthesia with the patient or authorized representative who has indicated his/her understanding and acceptance.   Dental Advisory Given  Plan Discussed with: CRNA, Surgeon and Anesthesiologist  Anesthesia Plan Comments: (  )      Anesthesia Quick Evaluation

## 2016-08-19 ENCOUNTER — Ambulatory Visit (HOSPITAL_COMMUNITY): Payer: 59 | Admitting: Anesthesiology

## 2016-08-19 ENCOUNTER — Encounter (HOSPITAL_COMMUNITY)
Admission: RE | Disposition: A | Discharge status: Home / Self Care | Payer: Self-pay | Source: Ambulatory Visit | Attending: Otolaryngology

## 2016-08-19 ENCOUNTER — Encounter (HOSPITAL_COMMUNITY): Payer: Self-pay

## 2016-08-19 ENCOUNTER — Observation Stay (HOSPITAL_COMMUNITY)
Admission: RE | Admit: 2016-08-19 | Discharge: 2016-08-20 | Disposition: A | Discharge status: Home / Self Care | Payer: 59 | Source: Ambulatory Visit | Attending: Otolaryngology | Admitting: Otolaryngology

## 2016-08-19 DIAGNOSIS — Z79899 Other long term (current) drug therapy: Secondary | ICD-10-CM | POA: Insufficient documentation

## 2016-08-19 DIAGNOSIS — I10 Essential (primary) hypertension: Secondary | ICD-10-CM | POA: Insufficient documentation

## 2016-08-19 DIAGNOSIS — F17290 Nicotine dependence, other tobacco product, uncomplicated: Secondary | ICD-10-CM | POA: Insufficient documentation

## 2016-08-19 DIAGNOSIS — Z7951 Long term (current) use of inhaled steroids: Secondary | ICD-10-CM | POA: Insufficient documentation

## 2016-08-19 DIAGNOSIS — G4733 Obstructive sleep apnea (adult) (pediatric): Secondary | ICD-10-CM | POA: Insufficient documentation

## 2016-08-19 DIAGNOSIS — J343 Hypertrophy of nasal turbinates: Secondary | ICD-10-CM | POA: Insufficient documentation

## 2016-08-19 DIAGNOSIS — J342 Deviated nasal septum: Secondary | ICD-10-CM | POA: Diagnosis not present

## 2016-08-19 DIAGNOSIS — J328 Other chronic sinusitis: Secondary | ICD-10-CM | POA: Insufficient documentation

## 2016-08-19 HISTORY — PX: SEPTOPLASTY WITH ETHMOIDECTOMY, AND MAXILLARY ANTROSTOMY: SHX6090

## 2016-08-19 HISTORY — PX: TURBINATE REDUCTION: SHX6157

## 2016-08-19 SURGERY — FESS, WITH MAXILLARY ANTROSTOMY, SEPTOPLASTY, AND ETHMOIDECTOMY
Anesthesia: General | Site: Nose

## 2016-08-19 MED ORDER — LIDOCAINE-EPINEPHRINE 1 %-1:100000 IJ SOLN
INTRAMUSCULAR | Status: AC
  Filled 2016-08-19: qty 1

## 2016-08-19 MED ORDER — ONDANSETRON HCL 4 MG/2ML IJ SOLN
INTRAMUSCULAR | Status: AC
  Filled 2016-08-19: qty 2

## 2016-08-19 MED ORDER — PROPOFOL 10 MG/ML IV BOLUS
INTRAVENOUS | Status: DC | PRN
  Administered 2016-08-19: 200 mg via INTRAVENOUS

## 2016-08-19 MED ORDER — LIDOCAINE 2% (20 MG/ML) 5 ML SYRINGE
INTRAMUSCULAR | Status: AC
  Filled 2016-08-19: qty 5

## 2016-08-19 MED ORDER — ROCURONIUM BROMIDE 10 MG/ML (PF) SYRINGE
PREFILLED_SYRINGE | INTRAVENOUS | Status: DC | PRN
  Administered 2016-08-19: 50 mg via INTRAVENOUS
  Administered 2016-08-19: 30 mg via INTRAVENOUS

## 2016-08-19 MED ORDER — HYDROCODONE-ACETAMINOPHEN 5-325 MG PO TABS
1.0000 | ORAL_TABLET | ORAL | Status: DC | PRN
  Administered 2016-08-19: 1 via ORAL
  Administered 2016-08-20: 2 via ORAL
  Filled 2016-08-19: qty 2
  Filled 2016-08-19: qty 1

## 2016-08-19 MED ORDER — FENTANYL CITRATE (PF) 250 MCG/5ML IJ SOLN
INTRAMUSCULAR | Status: AC
  Filled 2016-08-19: qty 5

## 2016-08-19 MED ORDER — PANTOPRAZOLE SODIUM 40 MG PO TBEC
40.0000 mg | DELAYED_RELEASE_TABLET | Freq: Every day | ORAL | Status: DC
  Administered 2016-08-20: 40 mg via ORAL
  Filled 2016-08-19: qty 1

## 2016-08-19 MED ORDER — COCAINE HCL 4 % EX SOLN
CUTANEOUS | Status: AC
  Filled 2016-08-19: qty 4

## 2016-08-19 MED ORDER — ROCURONIUM BROMIDE 10 MG/ML (PF) SYRINGE
PREFILLED_SYRINGE | INTRAVENOUS | Status: AC
  Filled 2016-08-19: qty 5

## 2016-08-19 MED ORDER — LIDOCAINE 2% (20 MG/ML) 5 ML SYRINGE
INTRAMUSCULAR | Status: DC | PRN
  Administered 2016-08-19: 100 mg via INTRAVENOUS

## 2016-08-19 MED ORDER — ONDANSETRON HCL 4 MG/2ML IJ SOLN
4.0000 mg | INTRAMUSCULAR | Status: DC | PRN
  Administered 2016-08-19: 4 mg via INTRAVENOUS

## 2016-08-19 MED ORDER — ESMOLOL HCL 100 MG/10ML IV SOLN
INTRAVENOUS | Status: AC
Start: 2016-08-19 — End: 2016-08-19
  Filled 2016-08-19: qty 10

## 2016-08-19 MED ORDER — TAMSULOSIN HCL 0.4 MG PO CAPS
0.4000 mg | ORAL_CAPSULE | Freq: Every day | ORAL | Status: DC
  Administered 2016-08-20: 0.4 mg via ORAL
  Filled 2016-08-19: qty 1

## 2016-08-19 MED ORDER — BACITRACIN ZINC 500 UNIT/GM EX OINT
TOPICAL_OINTMENT | CUTANEOUS | Status: AC
  Filled 2016-08-19: qty 28.35

## 2016-08-19 MED ORDER — DEXAMETHASONE SODIUM PHOSPHATE 10 MG/ML IJ SOLN
INTRAMUSCULAR | Status: AC
  Filled 2016-08-19: qty 1

## 2016-08-19 MED ORDER — SUGAMMADEX SODIUM 200 MG/2ML IV SOLN
INTRAVENOUS | Status: AC
Start: 2016-08-19 — End: 2016-08-19
  Filled 2016-08-19: qty 2

## 2016-08-19 MED ORDER — OXYMETAZOLINE HCL 0.05 % NA SOLN
NASAL | Status: AC
  Filled 2016-08-19: qty 15

## 2016-08-19 MED ORDER — DEXAMETHASONE SODIUM PHOSPHATE 10 MG/ML IJ SOLN
INTRAMUSCULAR | Status: DC | PRN
  Administered 2016-08-19: 5 mg via INTRAVENOUS

## 2016-08-19 MED ORDER — MIDAZOLAM HCL 5 MG/5ML IJ SOLN
INTRAMUSCULAR | Status: DC | PRN
Start: 2016-08-19 — End: 2016-08-19
  Administered 2016-08-19: 2 mg via INTRAVENOUS

## 2016-08-19 MED ORDER — OXYMETAZOLINE HCL 0.05 % NA SOLN
NASAL | Status: DC | PRN
Start: 2016-08-19 — End: 2016-08-19
  Administered 2016-08-19: 1 via TOPICAL

## 2016-08-19 MED ORDER — OXYMETAZOLINE HCL 0.05 % NA SOLN
2.0000 | NASAL | Status: AC | PRN
  Administered 2016-08-19 (×2): 2 via NASAL
  Filled 2016-08-19: qty 15

## 2016-08-19 MED ORDER — HYDROCODONE-ACETAMINOPHEN 5-325 MG PO TABS: 2.0000 | ORAL_TABLET | ORAL | Status: DC | PRN

## 2016-08-19 MED ORDER — FENTANYL CITRATE (PF) 100 MCG/2ML IJ SOLN
INTRAMUSCULAR | Status: DC | PRN
  Administered 2016-08-19 (×3): 50 ug via INTRAVENOUS
  Administered 2016-08-19 (×2): 100 ug via INTRAVENOUS
  Administered 2016-08-19 (×2): 50 ug via INTRAVENOUS

## 2016-08-19 MED ORDER — ONDANSETRON HCL 4 MG/2ML IJ SOLN: 4.0000 mg | Freq: Once | INTRAMUSCULAR | Status: DC

## 2016-08-19 MED ORDER — HYDROCHLOROTHIAZIDE 25 MG PO TABS
12.5000 mg | ORAL_TABLET | Freq: Every day | ORAL | Status: DC
  Administered 2016-08-20: 12.5 mg via ORAL
  Filled 2016-08-19: qty 1

## 2016-08-19 MED ORDER — SODIUM CHLORIDE 0.9 % IR SOLN
Status: DC | PRN
  Administered 2016-08-19: 1000 mL

## 2016-08-19 MED ORDER — LIDOCAINE-EPINEPHRINE 1 %-1:100000 IJ SOLN
INTRAMUSCULAR | Status: DC | PRN
Start: 2016-08-19 — End: 2016-08-19
  Administered 2016-08-19: 10 mL

## 2016-08-19 MED ORDER — ACETAMINOPHEN 10 MG/ML IV SOLN
INTRAVENOUS | Status: AC
  Filled 2016-08-19: qty 100

## 2016-08-19 MED ORDER — FENTANYL CITRATE (PF) 100 MCG/2ML IJ SOLN
INTRAMUSCULAR | Status: AC
  Filled 2016-08-19: qty 2

## 2016-08-19 MED ORDER — 0.9 % SODIUM CHLORIDE (POUR BTL) OPTIME
TOPICAL | Status: DC | PRN
  Administered 2016-08-19: 1000 mL

## 2016-08-19 MED ORDER — FAMOTIDINE 20 MG PO TABS
10.0000 mg | ORAL_TABLET | Freq: Every day | ORAL | Status: DC
  Administered 2016-08-20: 10 mg via ORAL
  Filled 2016-08-19: qty 1

## 2016-08-19 MED ORDER — ONDANSETRON HCL 4 MG PO TABS: 4.0000 mg | ORAL_TABLET | ORAL | Status: DC | PRN

## 2016-08-19 MED ORDER — BUDESONIDE 0.25 MG/2ML IN SUSP: 0.2500 mg | Freq: Two times a day (BID) | RESPIRATORY_TRACT | Status: DC

## 2016-08-19 MED ORDER — MOMETASONE FURO-FORMOTEROL FUM 100-5 MCG/ACT IN AERO
2.0000 | INHALATION_SPRAY | Freq: Two times a day (BID) | RESPIRATORY_TRACT | Status: DC
  Administered 2016-08-19 – 2016-08-20 (×2): 2 via RESPIRATORY_TRACT
  Filled 2016-08-19: qty 8.8

## 2016-08-19 MED ORDER — AMLODIPINE BESYLATE 10 MG PO TABS
10.0000 mg | ORAL_TABLET | Freq: Every day | ORAL | Status: DC
  Administered 2016-08-20: 10 mg via ORAL
  Filled 2016-08-19: qty 1

## 2016-08-19 MED ORDER — FENTANYL CITRATE (PF) 100 MCG/2ML IJ SOLN: 25.0000 ug | INTRAMUSCULAR | Status: DC | PRN

## 2016-08-19 MED ORDER — ACETAMINOPHEN 10 MG/ML IV SOLN
1000.0000 mg | Freq: Four times a day (QID) | INTRAVENOUS | Status: DC
  Administered 2016-08-19: 1000 mg via INTRAVENOUS

## 2016-08-19 MED ORDER — FENTANYL CITRATE (PF) 100 MCG/2ML IJ SOLN
25.0000 ug | INTRAMUSCULAR | Status: DC | PRN
  Administered 2016-08-19: 50 ug via INTRAVENOUS

## 2016-08-19 MED ORDER — DEXAMETHASONE SODIUM PHOSPHATE 10 MG/ML IJ SOLN
5.0000 mg | Freq: Once | INTRAMUSCULAR | Status: AC
  Administered 2016-08-19: 5 mg via INTRAVENOUS

## 2016-08-19 MED ORDER — SUGAMMADEX SODIUM 200 MG/2ML IV SOLN
INTRAVENOUS | Status: DC | PRN
  Administered 2016-08-19: 200 mg via INTRAVENOUS

## 2016-08-19 MED ORDER — ONDANSETRON HCL 4 MG/2ML IJ SOLN
INTRAMUSCULAR | Status: DC | PRN
  Administered 2016-08-19: 4 mg via INTRAVENOUS

## 2016-08-19 MED ORDER — ESMOLOL HCL 100 MG/10ML IV SOLN
INTRAVENOUS | Status: DC | PRN
  Administered 2016-08-19: 10 mg via INTRAVENOUS

## 2016-08-19 MED ORDER — PROPOFOL 10 MG/ML IV BOLUS
INTRAVENOUS | Status: AC
  Filled 2016-08-19: qty 20

## 2016-08-19 MED ORDER — LIDOCAINE HCL (PF) 4 % IJ SOLN
INTRAMUSCULAR | Status: AC
  Filled 2016-08-19: qty 5

## 2016-08-19 MED ORDER — DEXTROSE-NACL 5-0.45 % IV SOLN: INTRAVENOUS | Status: DC

## 2016-08-19 MED ORDER — LACTATED RINGERS IV SOLN
INTRAVENOUS | Status: DC | PRN
  Administered 2016-08-19 (×2): via INTRAVENOUS

## 2016-08-19 MED ORDER — BACITRACIN-NEOMYCIN-POLYMYXIN 400-5-5000 EX OINT
TOPICAL_OINTMENT | CUTANEOUS | Status: AC
  Filled 2016-08-19: qty 1

## 2016-08-19 MED ORDER — IBUPROFEN 100 MG/5ML PO SUSP
400.0000 mg | Freq: Four times a day (QID) | ORAL | Status: DC | PRN
  Administered 2016-08-20: 400 mg via ORAL
  Filled 2016-08-19: qty 20

## 2016-08-19 MED ORDER — MIDAZOLAM HCL 2 MG/2ML IJ SOLN
INTRAMUSCULAR | Status: AC
  Filled 2016-08-19: qty 2

## 2016-08-19 MED ORDER — CEPHALEXIN 500 MG PO CAPS
500.0000 mg | ORAL_CAPSULE | Freq: Four times a day (QID) | ORAL | Status: DC
  Administered 2016-08-19 – 2016-08-20 (×3): 500 mg via ORAL
  Filled 2016-08-19 (×3): qty 1

## 2016-08-19 SURGICAL SUPPLY — 61 items
ATTRACTOMAT 16X20 MAGNETIC DRP (DRAPES) IMPLANT
BALL CTTN LRG ABS STRL LF (GAUZE/BANDAGES/DRESSINGS) ×1
BLADE RAD40 ROTATE 4M 4 5PK (BLADE) IMPLANT
BLADE RAD60 ROTATE M4 4 5PK (BLADE) IMPLANT
BLADE SURG 15 STRL LF DISP TIS (BLADE) IMPLANT
BLADE SURG 15 STRL SS (BLADE)
BLADE TRICUT ROTATE M4 4 5PK (BLADE) IMPLANT
CANISTER SUCT 3000ML PPV (MISCELLANEOUS) ×4 IMPLANT
CLEANER TIP ELECTROSURG 2X2 (MISCELLANEOUS) IMPLANT
COAGULATOR SUCT 8FR VV (MISCELLANEOUS) ×2 IMPLANT
COAGULATOR SUCT SWTCH 10FR 6 (ELECTROSURGICAL) ×1 IMPLANT
COTTONBALL LRG STERILE PKG (GAUZE/BANDAGES/DRESSINGS) ×2 IMPLANT
CRADLE DONUT ADULT HEAD (MISCELLANEOUS) ×1 IMPLANT
DECANTER SPIKE VIAL GLASS SM (MISCELLANEOUS) ×2 IMPLANT
DRAPE HALF SHEET 40X57 (DRAPES) IMPLANT
DRESSING TELFA 8X10 (GAUZE/BANDAGES/DRESSINGS) ×2 IMPLANT
ELECT COATED BLADE 2.86 ST (ELECTRODE) IMPLANT
ELECT REM PT RETURN 9FT ADLT (ELECTROSURGICAL) ×2
ELECTRODE REM PT RTRN 9FT ADLT (ELECTROSURGICAL) ×1 IMPLANT
FILTER ARTHROSCOPY CONVERTOR (FILTER) ×2 IMPLANT
GAUZE PACKING FOLDED 2  STR (GAUZE/BANDAGES/DRESSINGS) ×1
GAUZE PACKING FOLDED 2 STR (GAUZE/BANDAGES/DRESSINGS) ×1 IMPLANT
GAUZE SPONGE 2X2 8PLY STRL LF (GAUZE/BANDAGES/DRESSINGS) ×1 IMPLANT
GLOVE ECLIPSE 8.0 STRL XLNG CF (GLOVE) ×4 IMPLANT
GLOVE SS BIOGEL STRL SZ 7.5 (GLOVE) ×2 IMPLANT
GLOVE SUPERSENSE BIOGEL SZ 7.5 (GLOVE) ×2
GOWN STRL REUS W/ TWL LRG LVL3 (GOWN DISPOSABLE) ×1 IMPLANT
GOWN STRL REUS W/ TWL XL LVL3 (GOWN DISPOSABLE) ×1 IMPLANT
GOWN STRL REUS W/TWL LRG LVL3 (GOWN DISPOSABLE) ×2
GOWN STRL REUS W/TWL XL LVL3 (GOWN DISPOSABLE) ×2
KIT BASIN OR (CUSTOM PROCEDURE TRAY) ×2 IMPLANT
KIT ROOM TURNOVER OR (KITS) ×2 IMPLANT
NDL HYPO 25GX1X1/2 BEV (NEEDLE) ×1 IMPLANT
NDL SPNL 25GX3.5 QUINCKE BL (NEEDLE) ×1 IMPLANT
NEEDLE HYPO 25GX1X1/2 BEV (NEEDLE) ×2 IMPLANT
NEEDLE SPNL 25GX3.5 QUINCKE BL (NEEDLE) ×2 IMPLANT
NS IRRIG 1000ML POUR BTL (IV SOLUTION) ×2 IMPLANT
PAD ARMBOARD 7.5X6 YLW CONV (MISCELLANEOUS) ×4 IMPLANT
PATTIES SURGICAL .5 X3 (DISPOSABLE) ×2 IMPLANT
PENCIL BUTTON HOLSTER BLD 10FT (ELECTRODE) IMPLANT
PENCIL FOOT CONTROL (ELECTRODE) IMPLANT
SHEATH ENDOSCRUB 0 DEG (SHEATH) ×2 IMPLANT
SHEATH ENDOSCRUB 30 DEG (SHEATH) IMPLANT
SHEATH ENDOSCRUB 45 DEG (SHEATH) IMPLANT
SOLUTION ANTI FOG 6CC (MISCELLANEOUS) ×2 IMPLANT
SPECIMEN JAR SMALL (MISCELLANEOUS) ×2 IMPLANT
SPONGE GAUZE 2X2 STER 10/PKG (GAUZE/BANDAGES/DRESSINGS) ×1
SURGILUBE 2OZ TUBE FLIPTOP (MISCELLANEOUS) ×1 IMPLANT
SUT CHROMIC 3 0 PS 2 (SUTURE) IMPLANT
SUT CHROMIC 4 0 PS 2 18 (SUTURE) IMPLANT
SUT CHROMIC 5 0 P 3 (SUTURE) IMPLANT
SUT ETHILON 3 0 PS 1 (SUTURE) IMPLANT
SUT ETHILON 4 0 PS 2 18 (SUTURE) IMPLANT
SUT ETHILON 5 0 P 3 18 (SUTURE)
SUT NYLON ETHILON 5-0 P-3 1X18 (SUTURE) IMPLANT
SUT PDS AB 4-0 RB1 27 (SUTURE) ×1 IMPLANT
SUT SILK 2 0 FS (SUTURE) IMPLANT
TOWEL OR 17X24 6PK STRL BLUE (TOWEL DISPOSABLE) ×2 IMPLANT
TRAY ENT MC OR (CUSTOM PROCEDURE TRAY) ×2 IMPLANT
TUBE CONNECTING 12X1/4 (SUCTIONS) ×4 IMPLANT
WATER STERILE IRR 1000ML POUR (IV SOLUTION) ×2 IMPLANT

## 2016-08-19 NOTE — Anesthesia Procedure Notes (Signed)
Procedure Name: Intubation Date/Time: 08/19/2016 7:54 AM Performed by: Melina Copa, Evagelia Knack R Pre-anesthesia Checklist: Patient identified, Emergency Drugs available, Suction available and Patient being monitored Patient Re-evaluated:Patient Re-evaluated prior to induction Oxygen Delivery Method: Circle System Utilized Preoxygenation: Pre-oxygenation with 100% oxygen Induction Type: IV induction Ventilation: Mask ventilation without difficulty Laryngoscope Size: Mac and 4 Grade View: Grade I Tube type: Oral Number of attempts: 1 Airway Equipment and Method: Stylet Placement Confirmation: ETT inserted through vocal cords under direct vision,  positive ETCO2 and breath sounds checked- equal and bilateral Secured at: 22 cm Tube secured with: Tape Dental Injury: Teeth and Oropharynx as per pre-operative assessment

## 2016-08-19 NOTE — Progress Notes (Signed)
Transferred patient to med/surg teley bed.

## 2016-08-19 NOTE — Interval H&P Note (Signed)
History and Physical Interval Note:  08/19/2016 7:35 AM  Tyler [name]  has presented today for surgery, with the diagnosis of DEVIATED NASAL SEPTUM, BILATERAL TURBINATE HYPERTROPHY, CHRONIC SINUSITIS  The various methods of treatment have been discussed with the patient and family. After consideration of risks, benefits and other options for treatment, the patient has consented to  Procedure(s): SEPTOPLASTY WITH ETHMOIDECTOMY, AND MAXILLARY ANTROSTOMY (N/A) BILATERAL TURBINATE REDUCTION (N/A) as a surgical intervention .  The patient's history has been re-reviewed, patient re-examined, no change in status, stable for surgery.  I have re-reviewed the patient's chart and labs.  Questions were answered to the patient's satisfaction.     [name]

## 2016-08-19 NOTE — Op Note (Signed)
08/19/2016  10:34 AM    [name], Tyler Reaper  [id number]   Pre-Op Dx:  Deviated Nasal Septum, Hypertrophic Inferior Turbinates, recurrent LEFT sinusitis  Post-op Dx: Same  Proc: Nasal Septoplasty, Bilateral SMR Inferior Turbinates , LEFT endoscopic anterior ethmoidectomy, LEFT endoscopic antrostomy  Surg:  [name] T MD  Anes:  GOT  EBL:  50 ml  Comp:   none  Findings:   Buckled septal cartilage with RIGHT> LEFT nasal obstruction and prominent LEFT chondroethmoid spur.  Bulky inferior turbinates.  No active sinus disease or polyps identified.    Procedure: With the patient in a comfortable supine position,  general orotracheal anesthesia was induced without difficulty.     The patient received preoperative Afrin spray for topical decongestion and vasoconstriction.  Intravenous prophylactic antibiotics were administered.  At an appropriate level, the patient was placed in a semi-sitting position.  A saline moistened throat pack was placed.  Nasal vibrissae were trimmed.   Afrin  solution was applied on 0.5" x 3" cottonoids to both sides of the septal mucosa.   1% Xylocaine with 1:100,000 epinephrine, 10 cc's, was infiltrated into the anterior floor of the nose, into the nasal spine region, into the membranous columella, and finally into the submucoperichondrial plane of the septum on both sides.  Several minutes were allowed for this to take effect.  A sterile preparation and draping of the midface was accomplished in the standard fashion.  The materials were removed from the nose and observed to be intact and correct in number.  The nose was inspected with a headlight with the findings as described above.  A LEFT hemitransfixion incision was sharply executed and carried down to the caudal edge of the quadrangular cartilage and continued to a floor incision.  An opposite small floor incision was sharply executed as well.   Floor tunnels were elevated on both sides, carried  posteriorly, then medially, then brought forward along the vomer and maxillary crest.  The submucoperichondrial plane of the  LEFT septum was dissected up to the dorsum of the nose, back onto the perpendicular plate, and brought down and communicated with a floor tunnel and then forward along the maxillary crest.  The flap was generated intact.  The chondroethmoid junction was identified and opened with a Psychologist, educational.  The opposite submucoperiosteal plane of the perpendicular plate of the ethmoid  was elevated and carried down to the floor tunnel posteriorly.  The superior perpendicular plate was lysed with an open Jansen-Middleton forceps.  The inferior portion was dissected from the maxillary crest and vomer with a Cottle elevator.  The midportion was rocked free with a closed [employer] forceps and then delivered.    The posterior inferior corner of the quadrangular cartilage was submucosally resected, including a cartilaginous tail up along the vomer.     Maxillary crest posterior to the caudal strut was lowered with a mallet and osteotome. The quadrangular cartilage was separated from the upper lateral cartilages sharply on both sides. This allowed it to trap door into the midline freely.  After mobilizing the septum adequately, and straightening it in the standard fashion,  The septum was secured to the nasal spine with a figure-of-eight 4-0 PDS suture.  A good straight midline configuration of the septum with good dorsal support was generated.  The septal tunnel was suctioned clear.  Hemostasis was observed.  The flaps were laid back down.  The incisions were closed with interrupted 4-0 chromic suture.  Just prior to completing the septoplasty,  the inferior turbinates were each infiltrated with additional 1% Xylocaine with 1:100,000 epinephrine,  6 cc's total.  Local anesthetic was infiltrated into the left middle turbinate using a 25-gauge spinal needle. An Afrin moistened pledget was  placed in the middle meatus, and between the middle turbinate on the septum on the left side only.  Upon completing the septoplasty, beginning on the RIGHT side, the inferior turbinate was inspected and infractured.  The anterior hood of the inferior turbinate was sharply lysed just behind the nasal valve.  The medial mucosa of the inferior turbinate was incised in an  anterior upsloping fashion and a laterally based flap was developed from the turbinate bone.  Using angled turbinate scissors, turbinate bone and lateral mucosa were resected in a posterior downsloping fashion, taking much of the anterior pole and leaving most of the posterior pole.  Bony spicules were submucosally dissected and removed.  The mucosal flap was laid back down and the turbinate was outfractured.  This completed one SMR inferior turbinate.  The opposite side was performed in identical fashion.  The cut mucosal edges were suction coagulated on both sides for hemostasis.  Again hemostasis was observed.  After completing both turbinate resections, 0.040" reinforced Silastic splints were fashioned, placed against the nasal septum for support, and secured thereto with a 3-0 Ethilon stitch.     The 0 endoscope was brought into the field. Findings were as described above. The Afrin pledgets were removed and additional 1% Xylocaine with 1 100,000 epinephrine, 2 mL total was infiltrated into the lateral wall of the middle meatus anteriorly.  The middle turbinate was mobilized medially but not fractured. The uncinate process was identified and lysed at its base. This was removed with the debrider. The anterior face of the bulla ethmoidalis was identified and opened with a punch forceps. Bony partitions were palpated and opened with a punch forceps and, and then mucosa and bony partitions were removed with the debrider. A curved suction was placed into the antrum and the antrostomy was opened anteriorly and posteriorly. Again the debrider  was used to clean up the edges.  Switching to a 30 scope, the anterior ethmoid area was examined and additional bony partitions and mucosa were removed with punch forceps and with the debrider. Occurs suction was placed into the frontal sinus and the bony partitions were gently broken. Care was taken not to debride the mucosa in the frontal recess.  This completed the sinus portion of the procedure. No evidence of spinal fluid leakage or orbital fat. Specimens were sent for pathologic interpretation.  The nasal cavity was suctioned free. Telfa packs impregnated with bacitracin ointment were placed between the septum and the inferior turbinates, one on each side, for hemostasis and support.  A 7 mm nasal trumpet was shortened and placed between the Silastic splints in the Telfa packs in each side to allow some air passage postoperatively.   At this point the procedure was completed.  The pharynx was suctioned free and the throat pack was removed.   The patient was returned to anesthesia, awakened, extubated, and transferred to recovery in stable condition.  Dispo:   PACU to 23 hour step down observation given  known sleep apnea.  Plan: Ice, elevation, narcotic analgesia, prophylactic antibiotics for the duration of indwelling nasal foreign bodies.  We will remove the nasal packing In one day, the septal splints in 11 days.  Return to work or school in 10 days, strenuous activities in two weeks.  Erik Obey,  Marikay Alar MD

## 2016-08-19 NOTE — Discharge Instructions (Signed)
Keep head elevated 3-4 nights Take antibiotics until gone Pain medication as needed. May alternate with ibuprofen every 4 hours May resume aspirin when you get home Begin nasal hygiene measures when you get home Call for appointment in 11 days, [phone],  Also for problems with pain, bleeding, signs of infection Change drip pad as often as needed for several days.  You may discontinue this when no more drainage May rinse throat with cool dilute salt water to clear old blood and thick phlegm

## 2016-08-19 NOTE — Anesthesia Postprocedure Evaluation (Signed)
Anesthesia Post Note  Patient: Tyler [name]  Procedure(s) Performed: Procedure(s) (LRB): SEPTOPLASTY WITH ETHMOIDECTOMY, AND MAXILLARY ANTROSTOMY (N/A) BILATERAL TURBINATE REDUCTION (N/A)     Patient location during evaluation: PACU Anesthesia Type: General Level of consciousness: awake and alert Pain management: pain level controlled Vital Signs Assessment: post-procedure vital signs reviewed and stable Respiratory status: spontaneous breathing, nonlabored ventilation, respiratory function stable and patient connected to nasal cannula oxygen Cardiovascular status: blood pressure returned to baseline and stable Postop Assessment: no signs of nausea or vomiting Anesthetic complications: no    Last Vitals:  Vitals:   08/19/16 1700 08/19/16 1703  BP:    Pulse: 97   Resp: (!) 21   Temp:  36.6 C    Last Pain:  Vitals:   08/19/16 1703  TempSrc:   PainSc: 0-No pain                 Aritza Brunet EDWARD

## 2016-08-19 NOTE — Transfer of Care (Signed)
Immediate Anesthesia Transfer of Care Note  Patient: Tyler [name]  Procedure(s) Performed: Procedure(s): SEPTOPLASTY WITH ETHMOIDECTOMY, AND MAXILLARY ANTROSTOMY (N/A) BILATERAL TURBINATE REDUCTION (N/A)  Patient Location: PACU  Anesthesia Type:General  Level of Consciousness: awake and oriented  Airway & Oxygen Therapy: Patient Spontanous Breathing and Patient connected to nasal cannula oxygen  Post-op Assessment: Report given to RN, Post -op Vital signs reviewed and stable and Patient moving all extremities  Post vital signs: Reviewed and stable  Last Vitals:  Vitals:   08/19/16 0558 08/19/16 0617  BP: (!) 164/100 (!) 157/88  Pulse: 67 71  Resp: 15   Temp: 36.5 C     Last Pain:  Vitals:   08/19/16 0558  TempSrc: Oral      Patients Stated Pain Goal: 3 (12/45/80 9983)  Complications: No apparent anesthesia complications

## 2016-08-20 ENCOUNTER — Encounter (HOSPITAL_COMMUNITY): Payer: Self-pay | Admitting: Otolaryngology

## 2016-08-20 DIAGNOSIS — J342 Deviated nasal septum: Secondary | ICD-10-CM | POA: Diagnosis not present

## 2016-08-20 NOTE — Progress Notes (Signed)
Patient discharge teaching given, including activity, diet, follow-up appoints, and medications. Patient verbalized understanding of all discharge instructions. IV access was d/c'd. Vitals are stable. Skin is intact except as charted in most recent assessments. Pt to be escorted out by NT, to be driven home by family. 

## 2016-08-20 NOTE — Progress Notes (Signed)
Pt arrived on unit in wheelchair.  A/O X 4.  In no acute distress.  Assessments and vitals sign completed. Placed on telemetry and cont. Pos x.  In bed resting.  Will continue to monitor.

## 2016-08-20 NOTE — Discharge Summary (Signed)
08/20/2016 9:19 AM  Adami, Nicki Reaper [id number]  Post-Op Day 1    Temp:  [97.3 F (36.3 C)-98.6 F (37 C)] 98.6 F (37 C) (07/27 0609) Pulse Rate:  [92-109] 95 (07/27 0609) Resp:  [9-24] 18 (07/27 0609) BP: (145-169)/(77-95) 150/80 (07/27 0609) SpO2:  [88 %-97 %] 94 % (07/27 0609) Weight:  [101.3 kg (223 lb 6.4 oz)-102.1 kg (225 lb 1.4 oz)] 101.3 kg (223 lb 6.4 oz) (07/26 1937),     Intake/Output Summary (Last 24 hours) at 08/20/16 0919 Last data filed at 08/19/16 2000  Gross per 24 hour  Intake             1050 ml  Output              150 ml  Net              900 ml    No results found for this or any previous visit (from the past 24 hour(s)).  SUBJECTIVE:  Pain moderate and controlled.  spont void.  Breathing well. No chest pain.    OBJECTIVE:  Packs removed.  Mild bleeding.    IMPRESSION:  Satisfactory check  PLAN:   Discharge to home and care of family.  Admit: 43 JUL Discharge: 27 JUL Final diagnosis:  Deviated nasal septum, hypertrophic inferior turbinates.  Obstructive sleep apnea.  Chronic LEFT sinusitis Proc:  Nasal septoplasty, SMR inferior turbinates, LEFT endoscopic antrostomy and anterior ethmoidectomy. Comp:  None Cond:  Ambulatory. Pain controlled.  Packs removed.  Taking po.  Voiding well. Recheck: 8 days Rx:  Keflex, Vicodin Instructions written and given  Hosp Course:  Underwent surgery, then observed 23 hr stepdown given known sleep apnea.  Packs removed AM of POD 1 and pt discharged to home and care of family.  Will remove septal splints in 8 days.  [name]

## 2016-10-14 ENCOUNTER — Encounter: Payer: Self-pay | Admitting: Pulmonary Disease

## 2016-10-14 ENCOUNTER — Ambulatory Visit (INDEPENDENT_AMBULATORY_CARE_PROVIDER_SITE_OTHER): Payer: 59 | Admitting: Pulmonary Disease

## 2016-10-14 VITALS — BP 134/86 | HR 79 | Ht 70.0 in | Wt 230.0 lb

## 2016-10-14 DIAGNOSIS — R05 Cough: Secondary | ICD-10-CM | POA: Diagnosis not present

## 2016-10-14 DIAGNOSIS — R059 Cough, unspecified: Secondary | ICD-10-CM

## 2016-10-14 DIAGNOSIS — R062 Wheezing: Secondary | ICD-10-CM

## 2016-10-14 NOTE — Progress Notes (Signed)
Subjective:    Patient ID: Tyler [name], male    DOB: 09-06-1955, 61 y.o.   MRN: [id number]  Synopsis: First evaluated by [facility] for cough in the setting of likely asthma in 2018.    HPI Chief Complaint  Patient presents with  . Follow-up    Pt is breathing better, the dry hacking cough is gone. Had septoplasty for deriated peptum, turbinate reduction, and left anterior ethmoidectomy and antrostomy from Dr. Erik Obey 7/82/9562   His dry hacking cough is gone.  Losing his voice is no longer a problem. The since of congestion or constriction in breathing is gone now, for the most part.  He still has a wheeze and gurgling every now and then with some mucus production.  He can't identify a pattern for that.  He had this a few hours ago, none today.  He walks to school most days.  Of his medicines he doesn't think the inhalers havn't helped much.  He doesn't feel any different with them than without.  Taking a zyrtec at night has really helped.    He recently had sinus surgery performed by Dr. Izell [place] and says that his breathing has improved somewhat. Next line However, he says that he continues to produce fairly thick mucus once or twice a day. He still has some shortness of breath when he walks. The chest tightness has improved. In general the dry hacking cough and losing his voice is much better than before.  Past Medical History:  Diagnosis Date  . Arthritis   . Asthma   . Complication of anesthesia 08/17/2016   pt had urinary retention following last sinus surgery that required him to go to the ED to be in and out cathedx1  . History of hiatal hernia   . Hypertension   . Sleep apnea       Review of Systems  Constitutional: Negative for chills, fatigue and fever.  HENT: Negative for sinus pain, sinus pressure and sneezing.   Respiratory: Positive for cough and wheezing. Negative for shortness of breath.   Cardiovascular: Negative for chest pain, palpitations and  leg swelling.       Objective:   Physical Exam  Vitals:   10/14/16 1633  BP: 134/86  Pulse: 79  SpO2: 94%  Weight: 230 lb (104.3 kg)  Height: 5\' 10"  (1.778 m)    Gen: well appearing HENT: OP clear, TM's clear, neck supple PULM: Wheezing all over bilaterally B, normal percussion CV: RRR, no mgr, trace edema GI: BS+, soft, nontender Derm: no cyanosis or rash Psyche: normal mood and affect  Pulmonary function test: July 2018 ratio normal, FEV1 3.24 L 91% predicted, FVC 3.84 L 81% predicted, total lung capacity 6.84 L 97% protected, DLCO 31.02 95% predicted  Chest imaging: July 2018 chest x-ray images independently reviewed showing no pulmonary parenchymal abnormality.  FENO 06/2016 36 ppm  CBC    Component Value Date/Time   WBC 5.1 08/17/2016 0809   RBC 5.13 08/17/2016 0809   HGB 14.3 08/17/2016 0809   HCT 41.5 08/17/2016 0809   PLT 204 08/17/2016 0809   MCV 80.9 08/17/2016 0809   MCH 27.9 08/17/2016 0809   MCHC 34.5 08/17/2016 0809   RDW 13.0 08/17/2016 0809   LYMPHSABS 1.7 08/05/2016 1700   MONOABS 0.6 08/05/2016 1700   EOSABS 0.7 08/05/2016 1700   BASOSABS 0.1 08/05/2016 1700   July 2018 serum allergy profile negative with the exception of Allergen, A. alternata, m6 which was slightly  elevated at 0.29 kU/L, IgE normal     Assessment & Plan:   Cough - Plan: CT Chest High Resolution, Ambulatory referral to Allergy  Wheezing - Plan: Ambulatory referral to Allergy  Discussion: Tyler [name] has improved significantly in that his hacking cough and losing his voice has stopped since he stopped lisinopril. However, he continues to have wheezing on exam, daily mucus production, and some shortness of breath with exertion. Notably, he had a remarkably elevated serum eosinophil count when we saw him earlier this year. His serum allergy profile was within normal limits and his serum IgE level was also normal.  It is not clear to me why he still wheezes and has cough with  mucus production. Differential diagnosis includes eosinophilic bronchitis, allergic bronchopulmonary aspergillosis, or some form of bronchiectasis. He needs an allergy evaluation to try to get to the bottom of the eosinophilia.  Plan: For your cough with wheezing and mucus production: Keep taking Symbicort 2 puffs twice a day I am going to order a CT scan of your lungs We will refer you to  asthma and allergy for evaluation of the cough and eosinophilia. We will see you back in about 6 weeks    Current Outpatient Prescriptions:  .  amLODipine (NORVASC) 10 MG tablet, Take 10 mg by mouth daily., Disp: , Rfl:  .  aspirin 81 MG tablet, Take 81 mg by mouth daily., Disp: , Rfl:  .  budesonide-formoterol (SYMBICORT) 80-4.5 MCG/ACT inhaler, Inhale 2 puffs into the lungs 2 (two) times daily., Disp: 3 Inhaler, Rfl: 3 .  calcium carbonate (TUMS - DOSED IN MG ELEMENTAL CALCIUM) 500 MG chewable tablet, Chew 1 tablet by mouth daily as needed for indigestion or heartburn., Disp: , Rfl:  .  Calcium-Magnesium-Zinc (CAL-MAG-ZINC PO), Take 2 tablets by mouth daily., Disp: , Rfl:  .  cetirizine (ZYRTEC) 10 MG tablet, Take 10 mg by mouth every evening., Disp: , Rfl:  .  dextromethorphan (DELSYM) 30 MG/5ML liquid, Take 60 mg by mouth as needed for cough., Disp: , Rfl:  .  diclofenac (VOLTAREN) 75 MG EC tablet, Take 75 mg by mouth 2 (two) times daily as needed for pain., Disp: , Rfl: 0 .  famotidine (PEPCID) 10 MG tablet, Take 10 mg by mouth daily., Disp: , Rfl:  .  ferrous sulfate 325 (65 FE) MG tablet, Take 325 mg by mouth daily. , Disp: , Rfl:  .  fluticasone (FLONASE) 50 MCG/ACT nasal spray, Place 1 spray into both nostrils 2 (two) times daily. , Disp: , Rfl:  .  hydrochlorothiazide (HYDRODIURIL) 12.5 MG tablet, Take 12.5 mg by mouth daily., Disp: , Rfl:  .  ibuprofen (ADVIL,MOTRIN) 200 MG tablet, Take 600 mg by mouth daily as needed for headache or moderate pain., Disp: , Rfl:  .  Omega-3 Fatty Acids  (FISH OIL ULTRA) 1400 MG CAPS, Take 2,800 mg by mouth 2 (two) times daily., Disp: , Rfl:  .  Phenylephrine-APAP-Guaifenesin (MUCINEX FAST-MAX) 10-650-400 MG/20ML LIQD, Take 20 mLs by mouth daily as needed (cough)., Disp: , Rfl:  .  sildenafil (VIAGRA) 50 MG tablet, Take 50 mg by mouth daily as needed for erectile dysfunction., Disp: , Rfl:  .  sodium chloride (OCEAN) 0.65 % SOLN nasal spray, Place 1 spray into both nostrils every evening., Disp: , Rfl:  .  Tamsulosin HCl (FLOMAX) 0.4 MG CAPS, Take 0.4 mg by mouth daily., Disp: , Rfl:

## 2016-10-14 NOTE — Patient Instructions (Signed)
For your cough with wheezing and mucus production: Keep taking Symbicort 2 puffs twice a day I am going to order a CT scan of your lungs We will refer you to [facility] for evaluation of the cough and eosinophilia. We will see you back in about 6 weeks

## 2016-10-17 ENCOUNTER — Encounter: Payer: Self-pay | Admitting: Pulmonary Disease

## 2016-10-21 ENCOUNTER — Ambulatory Visit (INDEPENDENT_AMBULATORY_CARE_PROVIDER_SITE_OTHER)
Admission: RE | Admit: 2016-10-21 | Discharge: 2016-10-21 | Disposition: A | Discharge status: Home / Self Care | Payer: 59 | Source: Ambulatory Visit | Attending: Pulmonary Disease | Admitting: Pulmonary Disease

## 2016-10-21 DIAGNOSIS — R059 Cough, unspecified: Secondary | ICD-10-CM

## 2016-10-21 DIAGNOSIS — R05 Cough: Secondary | ICD-10-CM

## 2016-10-21 NOTE — Telephone Encounter (Signed)
PCCs, can you all look at this patient's allergy referral and tell us where it went? Thanks!

## 2016-10-22 NOTE — Telephone Encounter (Signed)
PCC's pt has not heard back from anyone about this allergy referral---he would like this to go to one of the [facility] groups if at all possible.  Thanks

## 2016-10-22 NOTE — Telephone Encounter (Signed)
I checked Dr Anastasia Pall note & he stated he wants pt to be referred to [facility].  This was not stated in the referral that was placed.  The referral was sent to [facility] and they are on EPIC.  Dr Neldon Mc is in their office & I think this is where the confusion came from.  Kozlow is not with [facility].  [facility] is not on EPIC so I called them this morning & made the patient an appt with Dr Donneta Romberg on 10/15 at 10:45.  He needs to give them a 24 hr notice if he can not keep the appt.  He is to be off any antihistamines 3 days prior to appt.   I left the pt a voicemail & told him Dr Lake Bells stated in his note that he wanted pt to be seen at [facility], I made him an appt & I would let triage nurses know & they could notify him by [place].

## 2016-10-26 ENCOUNTER — Telehealth: Payer: Self-pay | Admitting: Pulmonary Disease

## 2016-10-26 NOTE — Telephone Encounter (Signed)
Notes recorded by Juanito Doom, MD on 10/25/2016 at 10:40 PM EDT A, Please let the patient know this showed his lungs were OK with the exception of changes suggestive of asthma and a few small nodules which are benign appearing but need following. We will discuss more on the next visit. However the radiologist noticed an abnormal area of soft tissue in the skin just below his left nipple. We will need to discuss more on the next visit.  Thanks, B  Advised pt of results. Pt understood and nothing further is needed. Made appt with BQ on 11/08/2016 at 4pm.

## 2016-11-08 ENCOUNTER — Ambulatory Visit (INDEPENDENT_AMBULATORY_CARE_PROVIDER_SITE_OTHER): Payer: 59 | Admitting: Pulmonary Disease

## 2016-11-08 ENCOUNTER — Encounter: Payer: Self-pay | Admitting: Pulmonary Disease

## 2016-11-08 VITALS — BP 138/80 | HR 71 | Ht 70.0 in | Wt 227.0 lb

## 2016-11-08 DIAGNOSIS — R911 Solitary pulmonary nodule: Secondary | ICD-10-CM

## 2016-11-08 DIAGNOSIS — J4540 Moderate persistent asthma, uncomplicated: Secondary | ICD-10-CM

## 2016-11-08 MED ORDER — ALBUTEROL SULFATE HFA 108 (90 BASE) MCG/ACT IN AERS: 2.0000 | INHALATION_SPRAY | Freq: Four times a day (QID) | RESPIRATORY_TRACT | 6 refills | Status: DC | PRN

## 2016-11-08 NOTE — Patient Instructions (Signed)
Asthma: Continue taking Symbicort 2 puffs twice a day with a spacer Use albuterol as needed for chest tightness wheezing or shortness of breath If you find that your wheezing improves slightly but not completely with Symbicort 80/4.5 then let us know and we will change her prescription to 160/4.5 and provide you with a sample  Pulmonary nodule: We will repeat a CT scan of your chest in 6 months  We will see you back in 6 months or sooner if needed

## 2016-11-08 NOTE — Progress Notes (Signed)
Subjective:    Patient ID: Tyler [name], male    DOB: 1955-10-09, 61 y.o.   MRN: [id number]  Synopsis: First evaluated by Tyler [name] for cough in the setting of likely asthma in 2018.    HPI Chief Complaint  Patient presents with  . Follow-up    Review CT chest.  pt states breathing is stable.     Tyler [name] says that he still has some wheezing.  He hasn't seen a pattern of triggers to explain his wheezing.    He saw allergy this morning and was told he had many different allergies and it was suggested that he start on immunotherapy.  He was prescribed montelukast.  He was given a spacer and his zyrtec was changed.   Past Medical History:  Diagnosis Date  . Arthritis   . Asthma   . Complication of anesthesia 08/17/2016   pt had urinary retention following last sinus surgery that required him to go to the ED to be in and out cathedx1  . History of hiatal hernia   . Hypertension   . Sleep apnea       Review of Systems  Constitutional: Negative for chills, fatigue and fever.  HENT: Negative for sinus pain, sinus pressure and sneezing.   Respiratory: Positive for cough and wheezing. Negative for shortness of breath.   Cardiovascular: Negative for chest pain, palpitations and leg swelling.       Objective:   Physical Exam  Vitals:   11/08/16 1605  BP: 138/80  Pulse: 71  SpO2: 96%  Weight: 227 lb (103 kg)  Height: 5\' 10"  (1.778 m)    Gen: well appearing HENT: OP clear, TM's clear, neck supple PULM: Wheezing R base B, normal percussion CV: RRR, no mgr, trace edema GI: BS+, soft, nontender Derm: no cyanosis or rash Psyche: normal mood and affect   [name] function test: July 2018 ratio normal, FEV1 3.24 L 91% predicted, FVC 3.84 L 81% predicted, total lung capacity 6.84 L 97% protected, DLCO 31.02 95% predicted  Chest imaging: July 2018 chest x-ray images independently reviewed showing no [name] parenchymal abnormality. September 2018  high-resolution CT chest images independently reviewed showing scattered [name] nodules and some air trapping, coronary artery calcification noted.  Tyler [name] 06/2016 36 ppm  CBC    Component Value Date/Time   WBC 5.1 08/17/2016 0809   RBC 5.13 08/17/2016 0809   HGB 14.3 08/17/2016 0809   HCT 41.5 08/17/2016 0809   PLT 204 08/17/2016 0809   MCV 80.9 08/17/2016 0809   MCH 27.9 08/17/2016 0809   MCHC 34.5 08/17/2016 0809   RDW 13.0 08/17/2016 0809   LYMPHSABS 1.7 08/05/2016 1700   MONOABS 0.6 08/05/2016 1700   EOSABS 0.7 08/05/2016 1700   BASOSABS 0.1 08/05/2016 1700   July 2018 serum allergy profile negative with the exception of Tyler [name], Tyler [name], m6 which was slightly elevated at 0.29 kU/L, Tyler [name] normal     Assessment & Plan:   Moderate persistent asthma, unspecified whether complicated  Solitary [name] nodule - Plan: CT Chest Wo Contrast  Discussion: He has moderate persistent asthma and based on today's allergy assessment a significant amount of environmental allergens. He's had some improvement with Tyler [name] but he still has wheezing. I've advised him to use the spacer provided by Dr. Donneta [name] today, but if he continues to wheeze than we can increase the dose to the maximum amount Tyler [name]. However, if this dose is ineffective then I agree he should start taking immunotherapy.  He has a [name] nodule which appears benign, we will follow with serial CT scans. We did discuss the significance of the calcium seen in his coronary arteries today. As he has not been experiencing shortness of breath or chest tightness we'll hold off on any further evaluation right now. He was instructed to let us or his PCP know if he develops that.  Plan: Asthma: Continue taking Tyler [name] 2 puffs twice a day with a spacer Use albuterol as needed for chest tightness wheezing or shortness of breath If you find that your wheezing improves slightly but not completely with Tyler [name] then  let us know and we will change her prescription to Tyler [name] and provide you with a sample  [name] nodule: We will repeat a CT scan of your chest in 6 months  Abnormal skin lesion seen on CT Chest: he reports that he has had a lipoma there for years  We will see you back in 6 months or sooner if needed    Current Outpatient Prescriptions:  .  amLODipine (NORVASC) 10 MG tablet, Take 10 mg by mouth daily., Disp: , Rfl:  .  aspirin 81 MG tablet, Take 81 mg by mouth daily., Disp: , Rfl:  .  budesonide-formoterol (Tyler [name]) 80-4.5 MCG/ACT inhaler, Inhale 2 puffs into the lungs 2 (two) times daily., Disp: 3 Inhaler, Rfl: 3 .  calcium carbonate (TUMS - DOSED IN MG ELEMENTAL CALCIUM) 500 MG chewable tablet, Chew 1 tablet by mouth daily as needed for indigestion or heartburn., Disp: , Rfl:  .  Calcium-Magnesium-Zinc (CAL-MAG-ZINC PO), Take 2 tablets by mouth daily., Disp: , Rfl:  .  dextromethorphan (DELSYM) 30 MG/5ML liquid, Take 60 mg by mouth as needed for cough., Disp: , Rfl:  .  diclofenac (VOLTAREN) 75 MG EC tablet, Take 75 mg by mouth 2 (two) times daily as needed for pain., Disp: , Rfl: 0 .  famotidine (PEPCID) 10 MG tablet, Take 10 mg by mouth daily., Disp: , Rfl:  .  ferrous sulfate 325 (65 FE) MG tablet, Take 325 mg by mouth daily. , Disp: , Rfl:  .  fluticasone (FLONASE) 50 MCG/ACT nasal spray, Place 1 spray into both nostrils 2 (two) times daily. , Disp: , Rfl:  .  hydrochlorothiazide (HYDRODIURIL) 12.5 MG tablet, Take 12.5 mg by mouth daily., Disp: , Rfl:  .  ibuprofen (ADVIL,MOTRIN) 200 MG tablet, Take 600 mg by mouth daily as needed for headache or moderate pain., Disp: , Rfl:  .  levocetirizine (XYZAL) 5 MG tablet, Take 5 mg by mouth every evening., Disp: , Rfl:  .  montelukast (SINGULAIR) 5 MG chewable tablet, Chew 5 mg by mouth at bedtime., Disp: , Rfl:  .  Omega-3 Fatty Acids (FISH OIL ULTRA) 1400 MG CAPS, Take 2,800 mg by mouth 2 (two) times daily., Disp: , Rfl:  .   Phenylephrine-APAP-Guaifenesin (MUCINEX FAST-MAX) 10-650-400 MG/20ML LIQD, Take 20 mLs by mouth daily as needed (cough)., Disp: , Rfl:  .  sildenafil (VIAGRA) 50 MG tablet, Take 50 mg by mouth daily as needed for erectile dysfunction., Disp: , Rfl:  .  sodium chloride (OCEAN) 0.65 % SOLN nasal spray, Place 1 spray into both nostrils every evening., Disp: , Rfl:  .  Tamsulosin HCl (FLOMAX) 0.4 MG CAPS, Take 0.4 mg by mouth daily., Disp: , Rfl:  .  albuterol (PROVENTIL HFA;VENTOLIN HFA) 108 (90 Base) MCG/ACT inhaler, Inhale 2 puffs into the lungs every 6 (six) hours as needed for wheezing or shortness of breath., Disp: 1  Inhaler, Rfl: 6

## 2016-12-18 ENCOUNTER — Encounter: Payer: Self-pay | Admitting: Pulmonary Disease

## 2017-03-15 ENCOUNTER — Telehealth: Payer: Self-pay | Admitting: Pulmonary Disease

## 2017-03-15 NOTE — Telephone Encounter (Signed)
Pt is calling back [phone]

## 2017-03-15 NOTE — Telephone Encounter (Signed)
Comments:  Dr. Lake Bells wanted a follow-up chest scan and an appointment afterward to review the results around April. What I don't remember is which one of [place] was to initiate the appointments! The week I have chosen is during spring break when I am out of school.

## 2017-03-15 NOTE — Telephone Encounter (Signed)
Spoke with pt, advised that he will be contacted in March to schedule April CT.  Scheduled rov with BQ on 4/18 at pt request.  Nothing further needed at this time.

## 2017-03-15 NOTE — Telephone Encounter (Signed)
 CT should call him with appointment, correct? Then he can call after his appt is made to meet with BQ for an OV.

## 2017-03-15 NOTE — Telephone Encounter (Signed)
An order was put in for him to have CT in April and I have the order in my March folder.  I schedule my April CT's in March so I will call him then to schedule.  He is on recall list for April so he should get a letter reminding him to schedule his April appt with MR.  When I schedule his CT I look to see if he has an appt with MR and if so I will schedule the CT prior to his appt.  If he has not made an appt with MR then I will schedule it for him when I call him with the CT appt.

## 2017-05-05 ENCOUNTER — Inpatient Hospital Stay: Admission: RE | Admit: 2017-05-05 | Payer: 59 | Source: Ambulatory Visit

## 2017-05-06 ENCOUNTER — Ambulatory Visit (INDEPENDENT_AMBULATORY_CARE_PROVIDER_SITE_OTHER)
Admission: RE | Admit: 2017-05-06 | Discharge: 2017-05-06 | Disposition: A | Discharge status: Home / Self Care | Payer: 59 | Source: Ambulatory Visit | Attending: Pulmonary Disease | Admitting: Pulmonary Disease

## 2017-05-06 DIAGNOSIS — R911 Solitary pulmonary nodule: Secondary | ICD-10-CM | POA: Diagnosis not present

## 2017-05-12 ENCOUNTER — Encounter: Payer: Self-pay | Admitting: Pulmonary Disease

## 2017-05-12 ENCOUNTER — Ambulatory Visit (INDEPENDENT_AMBULATORY_CARE_PROVIDER_SITE_OTHER): Payer: 59 | Admitting: Pulmonary Disease

## 2017-05-12 VITALS — BP 126/82 | HR 66 | Ht 70.0 in | Wt 228.0 lb

## 2017-05-12 DIAGNOSIS — R918 Other nonspecific abnormal finding of lung field: Secondary | ICD-10-CM

## 2017-05-12 DIAGNOSIS — R911 Solitary pulmonary nodule: Secondary | ICD-10-CM | POA: Diagnosis not present

## 2017-05-12 DIAGNOSIS — J4540 Moderate persistent asthma, uncomplicated: Secondary | ICD-10-CM

## 2017-05-12 MED ORDER — BECLOMETHASONE DIPROPIONATE 80 MCG/ACT IN AERS: 2.0000 | INHALATION_SPRAY | Freq: Two times a day (BID) | RESPIRATORY_TRACT | 6 refills | Status: DC

## 2017-05-12 NOTE — Patient Instructions (Signed)
Pulmonary nodule: This was stable on the most recent CT which is a good thing We will plan on repeating a CT chest in October 2020  Mild persistent asthma: I am pleased that the immunotherapy has significantly helped you Stop Symbicort Take Qvar 2 puffs twice a day, if you have not had significant chest tightness wheezing shortness of breath or albuterol use after 2 months then decrease the dose to 1 puff twice a day When you see Dr. Donneta Romberg in the fall ask him if you can stop the Qvar  We will see you back in October 2020

## 2017-05-12 NOTE — Progress Notes (Signed)
Subjective:    Patient ID: Tyler [name], male    DOB: [birth year], 62 y.o.   MRN: [id number]  Synopsis: First evaluated by [facility] for cough in the setting of likely asthma in 2018.    HPI Chief Complaint  Patient presents with  . Follow-up    Feeling better started allergy therapy this is helping alot. Cough white mucus.   Tyler [name] and his wife have announced their requirement. He feels the allergy therapy has helped a lot. He says that he occasionally wheezes and coughs up phlegm rarely, but this doesn't happen. He does not use albuterol No longer feels winded while walking to school. He had influenza this year and pneumonia in January.  No hospitalization: follow up with Dr. Vira Agar.    Past Medical History:  Diagnosis Date  . Arthritis   . Asthma   . Complication of anesthesia 08/17/2016   pt had urinary retention following last sinus surgery that required him to go to the ED to be in and out cathedx1  . History of hiatal hernia   . Hypertension   . Sleep apnea       Review of Systems  Constitutional: Negative for chills, fatigue and fever.  HENT: Negative for sinus pressure, sinus pain and sneezing.   Respiratory: Positive for cough and wheezing. Negative for shortness of breath.   Cardiovascular: Negative for chest pain, palpitations and leg swelling.       Objective:   Physical Exam  Vitals:   05/12/17 0859  BP: 126/82  Pulse: 66  SpO2: 95%  Weight: 228 lb (103.4 kg)  Height: 5\' 10"  (1.778 m)    Gen: well appearing HENT: OP clear, TM's clear, neck supple PULM: CTA B, normal percussion CV: RRR, no mgr, trace edema GI: BS+, soft, nontender Derm: no cyanosis or rash Psyche: normal mood and affect    Pulmonary function test: July 2018 ratio normal, FEV1 3.24 L 91% predicted, FVC 3.84 L 81% predicted, total lung capacity 6.84 L 97% protected, DLCO 31.02 95% predicted  Chest imaging: July 2018 chest x-ray images independently reviewed  showing no pulmonary parenchymal abnormality. September 2018 high-resolution CT chest images independently reviewed showing scattered pulmonary nodules and some air trapping, coronary artery calcification noted. 04/2017 HRCT: 76mm nodules unchanged, rec f/u in 18 months  FENO 06/2016 36 ppm  CBC    Component Value Date/Time   WBC 5.1 08/17/2016 0809   RBC 5.13 08/17/2016 0809   HGB 14.3 08/17/2016 0809   HCT 41.5 08/17/2016 0809   PLT 204 08/17/2016 0809   MCV 80.9 08/17/2016 0809   MCH 27.9 08/17/2016 0809   MCHC 34.5 08/17/2016 0809   RDW 13.0 08/17/2016 0809   LYMPHSABS 1.7 08/05/2016 1700   MONOABS 0.6 08/05/2016 1700   EOSABS 0.7 08/05/2016 1700   BASOSABS 0.1 08/05/2016 1700   July 2018 serum allergy profile negative with the exception of Allergen, A. alternata, m6 which was slightly elevated at 0.29 kU/L, IgE normal     Assessment & Plan:   Solitary pulmonary nodule  Abnormal findings on diagnostic imaging of lung - Plan: CT Chest Wo Contrast  Moderate persistent asthma, unspecified whether complicated  Discussion: This has been a stable interval for Tyler [name].  He had influenza in the wintertime but apart from that his asthma has been very well controlled.  Immunotherapy has made a lot of difference for this.  I told him today that we can step down his controller therapy by discontinuing Symbicort  and just using an inhaled corticosteroid alone.  In regards to his pulmonary nodules they are stable so I see no indication for change in our plan for a repeat CT scan in 18 months  Pulmonary nodule: This was stable on the most recent CT which is a good thing We will plan on repeating a CT chest in October 2020  Mild persistent asthma: I am pleased that the immunotherapy has significantly helped you Stop Symbicort Take Qvar 2 puffs twice a day, if you have not had significant chest tightness wheezing shortness of breath or albuterol use after 2 months then decrease the dose  to 1 puff twice a day When you see Dr. Donneta Romberg in the fall ask him if you can stop the Qvar  We will see you back in October 2020   Current Outpatient Medications:  .  albuterol (PROVENTIL HFA;VENTOLIN HFA) 108 (90 Base) MCG/ACT inhaler, Inhale 2 puffs into the lungs every 6 (six) hours as needed for wheezing or shortness of breath., Disp: 1 Inhaler, Rfl: 6 .  amLODipine (NORVASC) 10 MG tablet, Take 10 mg by mouth daily., Disp: , Rfl:  .  aspirin 81 MG tablet, Take 81 mg by mouth daily., Disp: , Rfl:  .  beclomethasone (QVAR) 80 MCG/ACT inhaler, Inhale 2 puffs into the lungs 2 (two) times daily., Disp: 1 Inhaler, Rfl: 6 .  calcium carbonate (TUMS - DOSED IN MG ELEMENTAL CALCIUM) 500 MG chewable tablet, Chew 1 tablet by mouth daily as needed for indigestion or heartburn., Disp: , Rfl:  .  Calcium-Magnesium-Zinc (CAL-MAG-ZINC PO), Take 2 tablets by mouth daily., Disp: , Rfl:  .  dextromethorphan (DELSYM) 30 MG/5ML liquid, Take 60 mg by mouth as needed for cough., Disp: , Rfl:  .  diclofenac (VOLTAREN) 75 MG EC tablet, Take 75 mg by mouth 2 (two) times daily as needed for pain., Disp: , Rfl: 0 .  famotidine (PEPCID) 10 MG tablet, Take 10 mg by mouth daily., Disp: , Rfl:  .  ferrous sulfate 325 (65 FE) MG tablet, Take 325 mg by mouth daily. , Disp: , Rfl:  .  fluticasone (FLONASE) 50 MCG/ACT nasal spray, Place 1 spray into both nostrils 2 (two) times daily. , Disp: , Rfl:  .  hydrochlorothiazide (HYDRODIURIL) 12.5 MG tablet, Take 12.5 mg by mouth daily., Disp: , Rfl:  .  ibuprofen (ADVIL,MOTRIN) 200 MG tablet, Take 600 mg by mouth daily as needed for headache or moderate pain., Disp: , Rfl:  .  levocetirizine (XYZAL) 5 MG tablet, Take 5 mg by mouth every evening., Disp: , Rfl:  .  montelukast (SINGULAIR) 5 MG chewable tablet, Chew 5 mg by mouth at bedtime., Disp: , Rfl:  .  Omega-3 Fatty Acids (FISH OIL ULTRA) 1400 MG CAPS, Take 2,800 mg by mouth 2 (two) times daily., Disp: , Rfl:  .   Phenylephrine-APAP-Guaifenesin (MUCINEX FAST-MAX) 10-650-400 MG/20ML LIQD, Take 20 mLs by mouth daily as needed (cough)., Disp: , Rfl:  .  sildenafil (VIAGRA) 50 MG tablet, Take 50 mg by mouth daily as needed for erectile dysfunction., Disp: , Rfl:  .  sodium chloride (OCEAN) 0.65 % SOLN nasal spray, Place 1 spray into both nostrils every evening., Disp: , Rfl:  .  Tamsulosin HCl (FLOMAX) 0.4 MG CAPS, Take 0.4 mg by mouth daily., Disp: , Rfl:

## 2017-05-27 ENCOUNTER — Other Ambulatory Visit: Payer: Self-pay

## 2017-05-27 MED ORDER — BECLOMETHASONE DIPROP HFA 80 MCG/ACT IN AERB: 2.0000 | INHALATION_SPRAY | Freq: Two times a day (BID) | RESPIRATORY_TRACT | 11 refills | Status: DC

## 2017-05-31 ENCOUNTER — Encounter: Payer: Self-pay | Admitting: Neurology

## 2017-06-01 ENCOUNTER — Encounter: Payer: Self-pay | Admitting: Neurology

## 2017-06-01 ENCOUNTER — Ambulatory Visit (INDEPENDENT_AMBULATORY_CARE_PROVIDER_SITE_OTHER): Payer: 59 | Admitting: Neurology

## 2017-06-01 VITALS — BP 186/97 | HR 82 | Ht 70.0 in | Wt 231.0 lb

## 2017-06-01 DIAGNOSIS — G4733 Obstructive sleep apnea (adult) (pediatric): Secondary | ICD-10-CM | POA: Diagnosis not present

## 2017-06-01 DIAGNOSIS — Z9989 Dependence on other enabling machines and devices: Secondary | ICD-10-CM

## 2017-06-01 DIAGNOSIS — J4540 Moderate persistent asthma, uncomplicated: Secondary | ICD-10-CM

## 2017-06-01 DIAGNOSIS — G2581 Restless legs syndrome: Secondary | ICD-10-CM

## 2017-06-01 DIAGNOSIS — Z9889 Other specified postprocedural states: Secondary | ICD-10-CM | POA: Diagnosis not present

## 2017-06-01 DIAGNOSIS — E669 Obesity, unspecified: Secondary | ICD-10-CM

## 2017-06-01 DIAGNOSIS — R918 Other nonspecific abnormal finding of lung field: Secondary | ICD-10-CM | POA: Diagnosis not present

## 2017-06-01 DIAGNOSIS — R351 Nocturia: Secondary | ICD-10-CM | POA: Diagnosis not present

## 2017-06-01 NOTE — Patient Instructions (Signed)
You are fully compliant with CPAP. Your current machine works well for your sleep apnea, but it is older and you may be eligible for a new CPAP machine. I would like to proceed with a sleep study to update your records and then order your new CPAP machine. In the meantime, continue using your current machine at the current settings via medium nasal pillows.  We will call you to set up your sleep study. As discussed, based on your insurance coverage, you may have a sleep study in the sleep lab or a home sleep test.

## 2017-06-01 NOTE — Progress Notes (Signed)
Subjective:    Patient ID: Tyler [name] is a 62 y.o. male.  HPI     Star Age, MD, PhD [facility] [address], [address] [place], [place] [address]  Dear Dr. Osborne Casco,   I saw your patient, Tyler [name], upon your kind request, in my neurologic clinic today for initial consultation of his sleep disorder, in particular, re-evaluation of his prior diagnosis of obstructive sleep apnea. The patient is unaccompanied today. As you know, Tyler [name] is a 62 year old right-handed gentleman with an underlying medical history of asthma, lung nodule, arthritis, reflux disease, allergic rhinitis, migraine headaches and obesity, who was previously diagnosed with obstructive sleep apnea and placed on CPAP therapy. Sleep study testing was several years ago, prior sleep study results are not available for my review today. He has been on CPAP therapy and reports compliance. His CPAP machine is close to 63 years old. He was originally diagnosed with obstructive sleep apnea in 2009. He received his current CPAP machine in 2014. He is compliant with treatment. I reviewed his most recent compliance data from 05/02/2017 through 05/31/2017 which is a total of 30 days, during which time he used his machine every night with percent used days greater than 4 hours at 100%, indicating superb compliance with an average usage of 7 hours and 37 minutes, residual AHI at goal at 2.4 per hour, leak on the low side with the 95th percentile at 5 L/m, pressure of 7 cm without EPR. He uses medium P 10 nasal pillows. I reviewed your office note from 04/08/2017, which you kindly included. He had septoplasty and and inferior turbinate reduction as well as left anterior ethmoidectomy under Dr. Erik [name] in July 8250. He has had issues with chronic rhinitis as well as sinusitis and has also been diagnosed with multiple allergies as well as asthma. He is married and lives with his wife, they have  2 adult children, he is planning to retire next month. 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 His bedtime is between 10 and 11, rise time around 6. He has nocturia about twice per average night and denies morning headaches, he has occasional restless leg symptoms and it helps to walk around to relieve the symptoms. In the past, he had seen Dr. Erling Cruz for his restless leg syndrome and tried Requip which caused side effects including sleepiness and nausea.  His Past Medical History Is Significant For: Past Medical History:  Diagnosis Date  . Arthritis   . Asthma   . Complication of anesthesia 08/17/2016   pt had urinary retention following last sinus surgery that required him to go to the ED to be in and out [id number]  . History of hiatal hernia   . Hypertension   . Sleep apnea     His Past Surgical History Is Significant For: Past Surgical History:  Procedure Laterality Date  . NASAL SINUS SURGERY  2002  . ROTATOR CUFF REPAIR    . SEPTOPLASTY WITH ETHMOIDECTOMY, AND MAXILLARY ANTROSTOMY N/A 08/19/2016   Procedure: SEPTOPLASTY WITH ETHMOIDECTOMY, AND MAXILLARY ANTROSTOMY;  Surgeon: [name], MD;  Location: [place];  Service: ENT;  Laterality: N/A;  . TURBINATE REDUCTION N/A 08/19/2016   Procedure: BILATERAL TURBINATE REDUCTION;  Surgeon: [name], MD;  Location: [facility] OR;  Service: ENT;  Laterality: N/A;  . WISDOM TOOTH EXTRACTION      His Family History Is Significant For: Family History  Problem Relation Age of Onset  . Aneurysm  Father   . Heart failure Mother   . Colon cancer Neg Hx   . Esophageal cancer Neg Hx   . Rectal cancer Neg Hx   . Stomach cancer Neg Hx     His Social History Is Significant For: Social History   Socioeconomic History  . Marital status: Married    Spouse name: Not on file  . Number of children: 2  . Years of education: Not on file  . Highest education level: Not on file  Occupational History  . Occupation: Pharmacist, hospital   Social Needs  . Financial resource strain: Not on file  . Food insecurity:    Worry: Not on file    Inability: Not on file  . Transportation needs:    Medical: Not on file    Non-medical: Not on file  Tobacco Use  . Smoking status: Never Smoker  . Smokeless tobacco: Never Used  . Tobacco comment: occasional cigar  Substance and Sexual Activity  . Alcohol use: Yes    Alcohol/week: 3.0 oz    Types: 5 Glasses of wine per week  . Drug use: No  . Sexual activity: Not on file  Lifestyle  . Physical activity:    Days per week: Not on file    Minutes per session: Not on file  . Stress: Not on file  Relationships  . Social connections:    Talks on phone: Not on file    Gets together: Not on file    Attends religious service: Not on file    Active member of club or organization: Not on file    Attends meetings of clubs or organizations: Not on file    Relationship status: Not on file  Other Topics Concern  . Not on file  Social History Narrative  . Not on file    His Allergies Are:  Allergies  Allergen Reactions  . Lisinopril     cough  . Augmentin [Amoxicillin-Pot Clavulanate] Rash    Has patient had a PCN reaction causing immediate rash, facial/tongue/throat swelling, SOB or lightheadedness with hypotension: Yes Has patient had a PCN reaction causing severe rash involving mucus membranes or skin necrosis: No Has patient had a PCN reaction that required hospitalization: No Has patient had a PCN reaction occurring within the last 10 years: Yes If all of the above answers are "NO", then may proceed with Cephalosporin use.   Marland Kitchen Doxycycline Rash  :   His Current Medications Are:  Outpatient Encounter Medications as of 06/01/2017  Medication Sig  . albuterol (PROVENTIL HFA;VENTOLIN HFA) 108 (90 Base) MCG/ACT inhaler Inhale 2 puffs into the lungs every 6 (six) hours as needed for wheezing or shortness of breath.  Marland Kitchen amLODipine (NORVASC) 10 MG tablet Take 10 mg by mouth daily.   Marland Kitchen aspirin 81 MG tablet Take 81 mg by mouth daily.  . beclomethasone (QVAR REDIHALER) 80 MCG/ACT inhaler Inhale 2 puffs into the lungs 2 (two) times daily.  . budesonide-formoterol (SYMBICORT) 80-4.5 MCG/ACT inhaler Inhale 2 puffs into the lungs 2 (two) times daily.  . calcium carbonate (TUMS - DOSED IN MG ELEMENTAL CALCIUM) 500 MG chewable tablet Chew 1 tablet by mouth daily as needed for indigestion or heartburn.  . Calcium-Magnesium-Zinc (CAL-MAG-ZINC PO) Take 2 tablets by mouth daily.  Marland Kitchen dextromethorphan (DELSYM) 30 MG/5ML liquid Take 60 mg by mouth as needed for cough.  . diclofenac (VOLTAREN) 75 MG EC tablet Take 75 mg by mouth 2 (two) times daily as needed for pain.  Marland Kitchen  famotidine (PEPCID) 10 MG tablet Take 10 mg by mouth daily.  . ferrous sulfate 325 (65 FE) MG tablet Take 325 mg by mouth daily.   . fluticasone (FLONASE) 50 MCG/ACT nasal spray Place 1 spray into both nostrils 2 (two) times daily.   . hydrochlorothiazide (HYDRODIURIL) 12.5 MG tablet Take 12.5 mg by mouth daily.  Marland Kitchen ibuprofen (ADVIL,MOTRIN) 200 MG tablet Take 600 mg by mouth daily as needed for headache or moderate pain.  Marland Kitchen levocetirizine (XYZAL) 5 MG tablet Take 5 mg by mouth every evening.  . montelukast (SINGULAIR) 5 MG chewable tablet Chew 5 mg by mouth at bedtime.  . Omega-3 Fatty Acids (FISH OIL ULTRA) 1400 MG CAPS Take 2,800 mg by mouth 2 (two) times daily.  Marland Kitchen Phenylephrine-APAP-Guaifenesin (MUCINEX FAST-MAX) 10-650-400 MG/20ML LIQD Take 20 mLs by mouth daily as needed (cough).  . sildenafil (VIAGRA) 50 MG tablet Take 50 mg by mouth daily as needed for erectile dysfunction.  . sodium chloride (OCEAN) 0.65 % SOLN nasal spray Place 1 spray into both nostrils every evening.  . Tamsulosin HCl (FLOMAX) 0.4 MG CAPS Take 0.4 mg by mouth daily.   No facility-administered encounter medications on file as of 06/01/2017.   :  Review of Systems:  Out of a complete 14 point review of systems, all are reviewed and negative with  the exception of these symptoms as listed below: Review of Systems  Neurological:       Pt presents today to discuss his cpap. Pt needs a cpap supply order. Pt received his machine in August of 2014. [facility] Pt recently had a nasal septum deviation repair. Pt buys supplies online. Pt has met his deductible with [facility] but will be switching to [facility] after his retirement in June. Pt's DME was [facility] in [facility].  Epworth Sleepiness Scale 0= would never doze 1= slight chance of dozing 2= moderate chance of dozing 3= high chance of dozing  Sitting and reading: 1 Watching TV: 1 Sitting inactive in a public place (ex. Theater or meeting): 0 As a passenger in a car for an hour without a break: 1 Lying down to rest in the afternoon: 2 Sitting and talking to someone: 0 Sitting quietly after lunch (no alcohol): 1 In a car, while stopped in traffic: 0 Total: 6     Objective:  Neurological Exam  Physical Exam Physical Examination:   Vitals:   06/01/17 1546  BP: (!) 186/97  Pulse: 82    General Examination: The patient is a very pleasant 62 y.o. male in no acute distress. He appears well-developed and well-nourished and well groomed.   HEENT: Normocephalic, atraumatic, pupils are equal, round and reactive to light and accommodation. He wears corrective eyeglasses. Extraocular tracking is good without limitation to gaze excursion or nystagmus noted. Normal smooth pursuit is noted. Hearing is grossly intact. Face is symmetric with normal facial animation and normal facial sensation. Speech is clear with no dysarthria noted. There is no hypophonia. There is no lip, neck/head, jaw or voice tremor. Neck is supple with full range of passive and active motion. There are no carotid bruits on auscultation. Oropharynx exam reveals: mild mouth dryness, adequate dental hygiene and moderate airway crowding, due to larger uvula, tonsils  are 1+, smaller airway entry. Mallampati is class II. Neck circumference is 18-1/8 inches. Tongue protrudes centrally and palate elevates symmetrically. Nasal inspection reveals no significant nasal mucosal bogginess or redness and no  septal deviation.   Chest: Clear to auscultation without wheezing, rhonchi or crackles noted.  Heart: S1+S2+0, regular and normal without murmurs, rubs or gallops noted.   Abdomen: Soft, non-tender and non-distended with normal bowel sounds appreciated on auscultation.  Extremities: There is no pitting edema in the distal lower extremities bilaterally. He is wearing compression socks up to the knees.   Skin: Warm and dry without trophic changes noted.  Musculoskeletal: exam reveals no obvious joint deformities, tenderness or joint swelling or erythema.   Neurologically:  Mental status: The patient is awake, alert and oriented in all 4 spheres. His immediate and remote memory, attention, language skills and fund of knowledge are appropriate. There is no evidence of aphasia, agnosia, apraxia or anomia. Speech is clear with normal prosody and enunciation. Thought process is linear. Mood is normal and affect is normal.  Cranial nerves II - XII are as described above under HEENT exam. In addition: shoulder shrug is normal with equal shoulder height noted. Motor exam: Normal bulk, strength and tone is noted. There is no drift, tremor or rebound. Romberg is negative. Fine motor skills and coordination: intact.  Cerebellar testing: No dysmetria or intention tremor on finger to nose testing. There is no truncal or gait ataxia.  Sensory exam: intact to light touch in the upper and lower extremities.  Gait, station and balance: He stands with difficulty. No veering to one side is noted. No leaning to one side is noted. Posture is age-appropriate and stance is narrow based. Gait shows normal stride length and normal pace. No problems turning are noted. Tandem walk is  unremarkable.      Assessment and Plan:  In summary, Kenney P Celmer is a very pleasant 62 y.o.-year old male with an underlying medical history of asthma, lung nodule, arthritis, reflux disease, allergic rhinitis, migraine headaches and obesity, who presents for sleep consultation. He was diagnosed with obstructive sleep apnea some 10 years ago. He has been on CPAP therapy with full compliance via nasal pillows. He would be eligible for a new machine at this point. He is advised to proceed with a sleep study to update his diagnosis and to be able to get a new machine and supplies, hopefully also establish with a new DME company as his previous DME company is out of business. He had septoplasty and inferior turbinate reduction under ENT last year with good results. He is followed by pulmonology for his asthma and also sees an allergy specialist. He has a history of restless leg syndrome for which he is currently not on any medication. I recommended the following at this time: sleep study with potential positive airway pressure titration. (We will score hypopneas at 4%).  I would like to see him back after the sleep study is completed and encouraged him to call with any interim questions, concerns, problems or updates.   Thank you very much for allowing me to participate in the care of this nice patient. If I can be of any further assistance to you please do not hesitate to call me at [phone].  Sincerely,   Star Age, MD, PhD

## 2017-06-02 ENCOUNTER — Telehealth: Payer: Self-pay

## 2017-06-02 DIAGNOSIS — G4733 Obstructive sleep apnea (adult) (pediatric): Secondary | ICD-10-CM

## 2017-06-02 DIAGNOSIS — Z9989 Dependence on other enabling machines and devices: Principal | ICD-10-CM

## 2017-06-02 NOTE — Telephone Encounter (Signed)
VO for HST from Dr. Athar received. HST order placed.  

## 2017-06-02 NOTE — Telephone Encounter (Signed)
[employer] denied in lab sleep study, need HST order 

## 2017-06-22 ENCOUNTER — Ambulatory Visit (INDEPENDENT_AMBULATORY_CARE_PROVIDER_SITE_OTHER): Payer: 59 | Admitting: Neurology

## 2017-06-22 DIAGNOSIS — Z9989 Dependence on other enabling machines and devices: Secondary | ICD-10-CM

## 2017-06-22 DIAGNOSIS — G4733 Obstructive sleep apnea (adult) (pediatric): Secondary | ICD-10-CM

## 2017-06-28 ENCOUNTER — Encounter: Payer: Self-pay | Admitting: Neurology

## 2017-07-01 ENCOUNTER — Encounter: Payer: Self-pay | Admitting: Neurology

## 2017-07-01 NOTE — Procedures (Signed)
[facility] [place], [place] [address] NAME: Tyler [name]                                                                 DOB: 1955/08/20 MEDICAL RECORD NUMBER [id number]                                                    DOS:  06/22/17 REFERRING PHYSICIAN: Domenick Gong, MD STUDY PERFORMED: Home Sleep Test HISTORY: 62 year old man with a history of asthma, lung nodule, arthritis, reflux disease, allergic rhinitis, migraine headaches and obesity, who was previously diagnosed with obstructive sleep apnea and placed on CPAP therapy. He presents for re-evaluation. BMI:33.1  STUDY RESULTS:  Total Recording Time: 6 hours, 54 minutes (valid test time: 5 hours, 27 min)  Total Apnea/Hypopnea Index (AHI):  31.4/h, RDI:  31.6 /h Average Oxygen Saturation:   93%, Lowest Oxygen Desaturation: 84 %  Total Time Oxygen Saturation Below or at 88%:  4.1 minutes  Average Heart Rate:   59 bpm (between 43 and 101 bpm) IMPRESSION: OSA RECOMMENDATION: This home sleep test demonstrates severe obstructive sleep apnea by number of events with a total AHI of 31.4/hour and O2 nadir of 84%. Treatment with positive airway pressure (in the form of CPAP) is recommended. This will require a full night CPAP titration study for proper treatment settings, O2 monitoring and mask fitting. Based on the severity of the sleep disordered breathing an attended titration study is indicated. However, patient's insurance has denied an attended sleep study; therefore, the patient will be advised to proceed with an autoPAP titration/trial at home for now. Please note that untreated obstructive sleep apnea may carry additional perioperative morbidity. Patients with significant obstructive sleep apnea should receive perioperative PAP therapy and the surgeons and particularly the anesthesiologist should be informed of the diagnosis and the severity of the sleep disordered breathing. The patient  should be cautioned not to drive, work at heights, or operate dangerous or heavy equipment when tired or sleepy. Review and reiteration of good sleep hygiene measures should be pursued with any patient. Other causes of the patient's symptoms, including circadian rhythm disturbances, an underlying mood disorder, medication effect and/or an underlying medical problem cannot be ruled out based on this test. Clinical correlation is recommended. The patient and his referring provider will be notified of the test results. The patient will be seen in follow up in sleep clinic at [facility]. I certify that I have reviewed the raw data recording prior to the issuance of this report in accordance with the standards of the American Academy of Sleep Medicine (AASM).  Star Age, MD, PhD [facility]) Diplomat, ABPN (Neurology and Sleep)

## 2017-07-01 NOTE — Addendum Note (Signed)
Addended by: Star Age on: 07/01/2017 02:46 PM   Modules accepted: Orders

## 2017-07-01 NOTE — Progress Notes (Signed)
Patient referred by Dr. Osborne Casco, seen by me on 06/01/17, HST on 06/22/17.    Please call and notify the patient that the recent home sleep test showed obstructive sleep apnea in the severe range. While I recommend treatment for this in the form CPAP, his insurance will not approve a sleep study for this. They will likely only approve a trial of autoPAP, which means, that we don't have to bring him in for a sleep study with CPAP, but will let him use a new autoPAP machine at home, through a DME company (of his choice, or as per insurance requirement). The DME representative will educate him on how to use the machine, how to put the mask on, etc.  He has an older CPAP and may have DME and mask preference. He may be eligible for a new machine at this point.  I have placed an order in the chart. Please send referral, talk to patient, send report to referring MD. We will need a FU in sleep clinic for 10 weeks post-PAP set up, please arrange that with me or one of our NPs. Thanks,   Star Age, MD, PhD [facility])

## 2017-07-04 ENCOUNTER — Telehealth: Payer: Self-pay

## 2017-07-04 NOTE — Telephone Encounter (Signed)
I called pt. I advised pt that Dr. Rexene Alberts reviewed their sleep study results and found that pt has severe osa. Dr. Rexene Alberts recommends that pt start an auto pap at home, since pt's insurance will likely deny an in-lab titration study. Pt's current cpap is almost 63 years old, and pt is not sure if it is auto capable. I will ask the DME to check this; if not capable, will likely need a 2 week auto loaner. I reviewed PAP compliance expectations with the pt. Pt is agreeable to starting an auto-PAP. I advised pt that an order will be sent to a DME, [facility], and [facility] will call the pt within about one week after they file with the pt's insurance. [facility] will show the pt how to use the machine, fit for masks, and troubleshoot the auto-PAP if needed. A follow up appt was made for insurance purposes with Dr. Rexene Alberts on 10/20/17 at 10:30am. Pt verbalized understanding to arrive 15 minutes early and bring their auto-PAP. A letter with all of this information in it will be sent to the pt's mychart account as a reminder. I verified with the pt that the address we have on file is correct. Pt verbalized understanding of results. Pt had no questions at this time but was encouraged to call back if questions arise.

## 2017-07-04 NOTE — Telephone Encounter (Signed)
-----   Message from Star Age, MD sent at 07/01/2017  2:46 PM EDT ----- Patient referred by Dr. Osborne Casco, seen by me on 06/01/17, HST on 06/22/17.    Please call and notify the patient that the recent home sleep test showed obstructive sleep apnea in the severe range. While I recommend treatment for this in the form CPAP, his insurance will not approve a sleep study for this. They will likely only approve a trial of autoPAP, which means, that we don't have to bring him in for a sleep study with CPAP, but will let him use a new autoPAP machine at home, through a DME company (of his choice, or as per insurance requirement). The DME representative will educate him on how to use the machine, how to put the mask on, etc.  He has an older CPAP and may have DME and mask preference. He may be eligible for a new machine at this point.  I have placed an order in the chart. Please send referral, talk to patient, send report to referring MD. We will need a FU in sleep clinic for 10 weeks post-PAP set up, please arrange that with me or one of our NPs. Thanks,   Star Age, MD, PhD [facility])

## 2017-10-19 ENCOUNTER — Encounter: Payer: Self-pay | Admitting: Neurology

## 2017-10-20 ENCOUNTER — Encounter: Payer: Self-pay | Admitting: Neurology

## 2017-10-20 ENCOUNTER — Ambulatory Visit: Payer: BC Managed Care – PPO | Admitting: Neurology

## 2017-10-20 VITALS — BP 139/89 | HR 80 | Ht 70.0 in | Wt 227.0 lb

## 2017-10-20 DIAGNOSIS — Z9989 Dependence on other enabling machines and devices: Secondary | ICD-10-CM | POA: Diagnosis not present

## 2017-10-20 DIAGNOSIS — G4733 Obstructive sleep apnea (adult) (pediatric): Secondary | ICD-10-CM

## 2017-10-20 NOTE — Progress Notes (Signed)
Subjective:    [name] ID: Tyler [name] is a 62 y.o. male.  HPI     Interim history:   Tyler [name] is a 62 year old right-handed gentleman with an underlying medical history of asthma, lung nodule, arthritis, reflux disease, allergic rhinitis, migraine headaches and obesity, who presents for follow-up consultation of his obstructive sleep apnea, after recent reevaluation of his OSA with a home sleep test and starting AutoPap therapy with new equipment. Tyler [name] is unaccompanied today. I first met him on 06/01/2017 at Tyler request of his primary care physician, at which time Tyler [name] reported a previous diagnosis of sleep apnea and also endorses some symptoms of RLS for which he was not on any medications. He needed a new CPAP machine and sleep study testing was close to 10 years prior. He was advised to proceed with a sleep study. His insurance denied a lab attended sleep study. He had a home sleep test on 06/22/2017 which indicated severe sleep apnea with an AHI of 31.4 per hour, average oxygen saturation of 93%, nadir of 84%. He was advised to proceed with AutoPap therapy at home.  Today, 10/20/2017: I reviewed his AutoPap compliance data from 09/21/2011 through 10/19/2017 which is a total of 30 days, during which time he used his AutoPap 28 days with percent used days greater than 4 hours at 93%, indicating excellent compliance with an average usage of 8 hours and 14 minutes, residual AHI at goal at 2.8 per hour, leak acceptable with Tyler 95th percentile at 10.2 L/m, 95th percentile pressure at 10.4 cm with a range of 5 cm to 10 cm. Of note, his previous CPAP machine was set at 7 cm without EPR. He reports doing well, likes his new machine, has adapted well to Tyler AutoPap, does not mind Tyler slightly higher pressure. Had to adjust Tyler humidity however. He does notice Tyler difference in Tyler mouth dryness compared to when he was on CPAP of 7 cm. Nevertheless, he likes his new nasal pillows  and has adapted well to Tyler AutoPap therapy. He has no questions today. He feels good about his sleep, still has nocturia which is about Tyler same. He takes his Flomax 2 pills in Tyler daytime, not at night. He has a routine checkup appointment with his primary care physician coming up. He had no interim changes to his medical history or medications.   Previously:  06/01/2017: (He) was previously diagnosed with obstructive sleep apnea and placed on CPAP therapy. Sleep study testing was several years ago, prior sleep study results are not available for my review today. He has been on CPAP therapy and reports compliance. His CPAP machine is close to 63 years old. He was originally diagnosed with obstructive sleep apnea in 2009. He received his current CPAP machine in 2014. He is compliant with treatment. I reviewed his most recent compliance data from 05/02/2017 through 05/31/2017 which is a total of 30 days, during which time he used his machine every night with percent used days greater than 4 hours at 100%, indicating superb compliance with an average usage of 7 hours and 37 minutes, residual AHI at goal at 2.4 per hour, leak on Tyler low side with Tyler 95th percentile at 5 L/m, pressure of 7 cm without EPR. He uses medium P 10 nasal pillows. I reviewed your office note from 04/08/2017, which you kindly included. He had septoplasty and and inferior turbinate reduction as well as left anterior ethmoidectomy under Dr. Erik Obey in July 5188. He  has had issues with chronic rhinitis as well as sinusitis and has also been diagnosed with multiple allergies as well as asthma. He is married and lives with his wife, they have 2 adult children, he is planning to retire next month. 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 His bedtime is between 10 and 11, rise time around 6. He has nocturia about twice per average night and denies morning headaches, he has occasional restless leg symptoms and it helps to  walk around to relieve Tyler symptoms. In Tyler past, he had seen Dr. Erling Cruz for his restless leg syndrome and tried Requip which caused side effects including sleepiness and nausea.  His Past Medical History Is Significant For: Past Medical History:  Diagnosis Date  . Arthritis   . Asthma   . Complication of anesthesia 08/17/2016   pt had urinary retention following last sinus surgery that required him to go to [facility] ED to be in and out [id number]  . History of hiatal hernia   . Hypertension   . Sleep apnea     His Past Surgical History Is Significant For: Past Surgical History:  Procedure Laterality Date  . NASAL SINUS SURGERY  2002  . ROTATOR CUFF REPAIR    . SEPTOPLASTY WITH ETHMOIDECTOMY, AND MAXILLARY ANTROSTOMY N/A 08/19/2016   Procedure: SEPTOPLASTY WITH ETHMOIDECTOMY, AND MAXILLARY ANTROSTOMY;  Surgeon: [name], MD;  Location: [facility];  Service: ENT;  Laterality: N/A;  . TURBINATE REDUCTION N/A 08/19/2016   Procedure: BILATERAL TURBINATE REDUCTION;  Surgeon: [name], MD;  Location: [facility] OR;  Service: ENT;  Laterality: N/A;  . WISDOM TOOTH EXTRACTION      His Family History Is Significant For: Family History  Problem Relation Age of Onset  . Aneurysm Father   . Heart failure Mother   . Colon cancer Neg Hx   . Esophageal cancer Neg Hx   . Rectal cancer Neg Hx   . Stomach cancer Neg Hx     His Social History Is Significant For: Social History   Socioeconomic History  . Marital status: Married    Spouse name: Not on file  . Number of children: 2  . Years of education: Not on file  . Highest education level: Not on file  Occupational History  . Occupation: Pharmacist, hospital  Social Needs  . Financial resource strain: Not on file  . Food insecurity:    Worry: Not on file    Inability: Not on file  . Transportation needs:    Medical: Not on file    Non-medical: Not on file  Tobacco Use  . Smoking status: Never Smoker  . Smokeless tobacco: Never Used  . Tobacco  comment: occasional cigar  Substance and Sexual Activity  . Alcohol use: Yes    Alcohol/week: 5.0 standard drinks    Types: 5 Glasses of wine per week  . Drug use: No  . Sexual activity: Not on file  Lifestyle  . Physical activity:    Days per week: Not on file    Minutes per session: Not on file  . Stress: Not on file  Relationships  . Social connections:    Talks on phone: Not on file    Gets together: Not on file    Attends religious service: Not on file    Active member of club or organization: Not on file    Attends meetings of clubs or organizations: Not on file    Relationship status: Not on file  Other  Topics Concern  . Not on file  Social History Narrative  . Not on file    His Allergies Are:  Allergies  Allergen Reactions  . Lisinopril     cough  . Augmentin [Amoxicillin-Pot Clavulanate] Rash    Has [name] had a PCN reaction causing immediate rash, facial/tongue/throat swelling, SOB or lightheadedness with hypotension: Yes Has [name] had a PCN reaction causing severe rash involving mucus membranes or skin necrosis: No Has [name] had a PCN reaction that required hospitalization: No Has [name] had a PCN reaction occurring within Tyler last 10 years: Yes If all of Tyler above answers are "NO", then may proceed with Cephalosporin use.   Marland Kitchen Doxycycline Rash  :   His Current Medications Are:  Outpatient Encounter Medications as of 10/20/2017  Medication Sig  . amLODipine (NORVASC) 10 MG tablet Take 10 mg by mouth daily.  Marland Kitchen aspirin 81 MG tablet Take 81 mg by mouth daily.  . beclomethasone (QVAR REDIHALER) 80 MCG/ACT inhaler Inhale 2 puffs into Tyler lungs 2 (two) times daily.  . calcium carbonate (TUMS - DOSED IN MG ELEMENTAL CALCIUM) 500 MG chewable tablet Chew 1 tablet by mouth daily as needed for indigestion or heartburn.  . Calcium-Magnesium-Zinc (CAL-MAG-ZINC PO) Take 2 tablets by mouth daily.  Marland Kitchen dextromethorphan (DELSYM) 30 MG/5ML liquid Take 60 mg by mouth as  needed for cough.  . diclofenac (VOLTAREN) 75 MG EC tablet Take 75 mg by mouth 2 (two) times daily as needed for pain.  . famotidine (PEPCID) 10 MG tablet Take 10 mg by mouth daily.  . ferrous sulfate 325 (65 FE) MG tablet Take 325 mg by mouth daily.   . fluticasone (FLONASE) 50 MCG/ACT nasal spray Place 1 spray into both nostrils 2 (two) times daily.   . hydrochlorothiazide (HYDRODIURIL) 12.5 MG tablet Take 12.5 mg by mouth daily.  Marland Kitchen ibuprofen (ADVIL,MOTRIN) 200 MG tablet Take 600 mg by mouth daily as needed for headache or moderate pain.  Marland Kitchen levocetirizine (XYZAL) 5 MG tablet Take 5 mg by mouth every evening.  . montelukast (SINGULAIR) 5 MG chewable tablet Chew 5 mg by mouth at bedtime.  . Montelukast Sodium (SINGULAIR PO) Take 5 mg by mouth daily.  . Omega-3 Fatty Acids (FISH OIL ULTRA) 1400 MG CAPS Take 2,800 mg by mouth 2 (two) times daily.  Marland Kitchen Phenylephrine-APAP-Guaifenesin (MUCINEX FAST-MAX) 10-650-400 MG/20ML LIQD Take 20 mLs by mouth daily as needed (cough).  . potassium chloride SA (K-DUR,KLOR-CON) 20 MEQ tablet Take 20 mEq by mouth daily.  . sildenafil (VIAGRA) 50 MG tablet Take 50 mg by mouth daily as needed for erectile dysfunction.  . sodium chloride (OCEAN) 0.65 % SOLN nasal spray Place 1 spray into both nostrils every evening.  . Tamsulosin HCl (FLOMAX) 0.4 MG CAPS Take 0.8 mg by mouth daily.   . [DISCONTINUED] albuterol (PROVENTIL HFA;VENTOLIN HFA) 108 (90 Base) MCG/ACT inhaler Inhale 2 puffs into Tyler lungs every 6 (six) hours as needed for wheezing or shortness of breath.  . [DISCONTINUED] budesonide-formoterol (SYMBICORT) 80-4.5 MCG/ACT inhaler Inhale 2 puffs into Tyler lungs 2 (two) times daily.  . [DISCONTINUED] montelukast (SINGULAIR) 5 MG chewable tablet Chew 5 mg by mouth at bedtime.   No facility-administered encounter medications on file as of 10/20/2017.   :  Review of Systems:  Out of a complete 14 point review of systems, all are reviewed and negative with Tyler  exception of these symptoms as listed below:  Review of Systems  Neurological:  Pt presents today to discuss his cpap. Pt's cpap is going well.    Objective:  Neurological Exam  Physical Exam Physical Examination:   Vitals:   10/20/17 1045  BP: 139/89  Pulse: 80   General Examination: Tyler [name] is a very pleasant 62 y.o. male in no acute distress. He appears well-developed and well-nourished and well groomed.   HEENT: Normocephalic, atraumatic, pupils are equal, round and reactive to light and accommodation. He wears corrective eyeglasses. Extraocular tracking is good without limitation to gaze excursion or nystagmus noted. Normal smooth pursuit is noted. Hearing is grossly intact. Face is symmetric with normal facial animation and normal facial sensation. Speech is clear with no dysarthria noted. There is no hypophonia. There is no lip, neck/head, jaw or voice tremor. Neck is supple with full range of passive and active motion. There are no carotid bruits on auscultation. Oropharynx exam reveals: mild mouth dryness, adequate dental hygiene and moderate airway crowding. Tongue protrudes centrally and palate elevates symmetrically.   Chest: Clear to auscultation without wheezing, rhonchi or crackles noted.  Heart: S1+S2+0, regular and normal without murmurs, rubs or gallops noted.   Abdomen: Soft, non-tender and non-distended with normal bowel sounds appreciated on auscultation.  Extremities: There is no pitting edema in Tyler distal lower extremities bilaterally.   Skin: Warm and dry without trophic changes noted.  Musculoskeletal: exam reveals no obvious joint deformities, tenderness or joint swelling or erythema.   Neurologically:  Mental status: Tyler [name] is awake, alert and oriented in all 4 spheres. His immediate and remote memory, attention, language skills and fund of knowledge are appropriate. There is no evidence of aphasia, agnosia, apraxia or anomia. Speech  is clear with normal prosody and enunciation. Thought process is linear. Mood is normal and affect is normal.  Cranial nerves II - XII are as described above under HEENT exam.  Motor exam: Normal bulk, strength and tone is noted. There is no tremor or rebound. Fine motor skills and coordination: intact.  Cerebellar testing: No dysmetria or intention tremor on finger to nose testing. There is no truncal or gait ataxia.  Sensory exam: intact to light touch in Tyler upper and lower extremities.  Gait, station and balance: He stands with no difficulty. Gait normal.  Assessment and Plan:  In summary, Tyler [name] is a very pleasant 62 year old male with an underlying medical history of asthma, lung nodule, arthritis, reflux disease, allergic rhinitis, migraine headaches and obesity, who presents for follow-up consultation of his obstructive sleep apnea after recent retesting with a home sleep test and starting AutoPap therapy. He needed reevaluation and new equipment as well as establish with a new DME company. His home sleep test from May 2019 confirmed severe sleep disordered breathing. He has been compliant with his AutoPap with full compliance. He has adapted well to Tyler new machine and Tyler nasal pillow interface. He is commended for his excellent treatment adherence and encouraged to continue with treatment at Tyler current settings. He does not mind staying on AutoPap therapy. Since he is not a novice to OSA treatment, and doing well, I recommended a yearly checkup. He can see one of our nurse practitioners next time. He is encouraged to talk to his primary care physician about his Flomax and whether he should take it at night rather than in Tyler morning. I answered all his questions today and he was in agreement. I spent 20 minutes in total face-to-face time with Tyler [name], more than 50% of which  was spent in counseling and coordination of care, reviewing test results, reviewing medication and  discussing or reviewing Tyler diagnosis of OSA, its prognosis and treatment options. Pertinent laboratory and imaging test results that were available during this visit with Tyler [name] were reviewed by me and considered in my medical decision making (see chart for details).

## 2017-10-20 NOTE — Patient Instructions (Addendum)

## 2017-10-25 ENCOUNTER — Ambulatory Visit: Payer: BC Managed Care – PPO | Admitting: Podiatry

## 2017-10-25 ENCOUNTER — Ambulatory Visit (INDEPENDENT_AMBULATORY_CARE_PROVIDER_SITE_OTHER): Payer: BC Managed Care – PPO

## 2017-10-25 ENCOUNTER — Encounter: Payer: Self-pay | Admitting: Podiatry

## 2017-10-25 VITALS — BP 151/84 | HR 83 | Resp 16

## 2017-10-25 DIAGNOSIS — M779 Enthesopathy, unspecified: Secondary | ICD-10-CM

## 2017-10-25 DIAGNOSIS — M2042 Other hammer toe(s) (acquired), left foot: Secondary | ICD-10-CM | POA: Diagnosis not present

## 2017-10-25 DIAGNOSIS — M778 Other enthesopathies, not elsewhere classified: Secondary | ICD-10-CM

## 2017-10-25 NOTE — Patient Instructions (Signed)
Pre-Operative Instructions  Congratulations, you have decided to take an important step towards improving your quality of life.  You can be assured that the doctors and staff at [facility] will be with you every step of the way.  Here are some important things you should know:  1. Plan to be at the surgery center/hospital at least 1 (one) hour prior to your scheduled time, unless otherwise directed by the surgical center/hospital staff.  You must have a responsible adult accompany you, remain during the surgery and drive you home.  Make sure you have directions to the surgical center/hospital to ensure you arrive on time. 2. If you are having surgery at [facility] or [facility] hospitals, you will need a copy of your medical history and physical form from your family physician within one month prior to the date of surgery. We will give you a form for your primary physician to complete.  3. We make every effort to accommodate the date you request for surgery.  However, there are times where surgery dates or times have to be moved.  We will contact you as soon as possible if a change in schedule is required.   4. No aspirin/ibuprofen for one week before surgery.  If you are on aspirin, any non-steroidal anti-inflammatory medications (Mobic, Aleve, Ibuprofen) should not be taken seven (7) days prior to your surgery.  You make take Tylenol for pain prior to surgery.  5. Medications - If you are taking daily heart and blood pressure medications, seizure, reflux, allergy, asthma, anxiety, pain or diabetes medications, make sure you notify the surgery center/hospital before the day of surgery so they can tell you which medications you should take or avoid the day of surgery. 6. No food or drink after midnight the night before surgery unless directed otherwise by surgical center/hospital staff. 7. No alcoholic beverages 24-hours prior to surgery.  No smoking 24-hours prior or 24-hours after  surgery. 8. Wear loose pants or shorts. They should be loose enough to fit over bandages, boots, and casts. 9. Don't wear slip-on shoes. Sneakers are preferred. 10. Bring your boot with you to the surgery center/hospital.  Also bring crutches or a walker if your physician has prescribed it for you.  If you do not have this equipment, it will be provided for you after surgery. 11. If you have not been contacted by the surgery center/hospital by the day before your surgery, call to confirm the date and time of your surgery. 12. Leave-time from work may vary depending on the type of surgery you have.  Appropriate arrangements should be made prior to surgery with your employer. 13. Prescriptions will be provided immediately following surgery by your doctor.  Fill these as soon as possible after surgery and take the medication as directed. Pain medications will not be refilled on weekends and must be approved by the doctor. 14. Remove nail polish on the operative foot and avoid getting pedicures prior to surgery. 15. Wash the night before surgery.  The night before surgery wash the foot and leg well with water and the antibacterial soap provided. Be sure to pay special attention to beneath the toenails and in between the toes.  Wash for at least three (3) minutes. Rinse thoroughly with water and dry well with a towel.  Perform this wash unless told not to do so by your physician.  Enclosed: 1 Ice pack (please put in freezer the night before surgery)   1 Hibiclens skin cleaner     Pre-op instructions  If you have any questions regarding the instructions, please do not hesitate to call our office.  [place]: [address], [place], [place] [address] -- [phone]  [place]: [address] [place], [place] [address] -- [phone]  [place]: [address]  , [place] [address] -- [phone]  [place]: [address], [address], [place], [place] [address] -- [phone]  Website:  [email or web address] 

## 2017-10-25 NOTE — Progress Notes (Signed)
Subjective:  Patient ID: Tyler [name], male    DOB: [birth year],  MRN: [id number] HPI Chief Complaint  Patient presents with  . Foot Pain    Plantar forefoot and 2nd/3rd toes left - "toes are locked" and has noticed toes separating, rubbing inside of shoe, tightness and numbness feeling plantar forefoot, no treatment  . New Patient (Initial Visit)    62 y.o. male presents with the above complaint.  ROS: Denies fever chills nausea vomiting muscle aches pains calf pain back pain chest pain shortness of breath.    Past Medical History:  Diagnosis Date  . Arthritis   . Asthma   . Complication of anesthesia 08/17/2016   pt had urinary retention following last sinus surgery that required him to go to the ED to be in and out cathedx1  . History of hiatal hernia   . Hypertension   . Sleep apnea    Past Surgical History:  Procedure Laterality Date  . NASAL SINUS SURGERY  2002  . ROTATOR CUFF REPAIR    . SEPTOPLASTY WITH ETHMOIDECTOMY, AND MAXILLARY ANTROSTOMY N/A 08/19/2016   Procedure: SEPTOPLASTY WITH ETHMOIDECTOMY, AND MAXILLARY ANTROSTOMY;  Surgeon: [name], MD;  Location: [facility];  Service: ENT;  Laterality: N/A;  . TURBINATE REDUCTION N/A 08/19/2016   Procedure: BILATERAL TURBINATE REDUCTION;  Surgeon: [name], MD;  Location: [facility];  Service: ENT;  Laterality: N/A;  . WISDOM TOOTH EXTRACTION      Current Outpatient Medications:  .  amLODipine (NORVASC) 10 MG tablet, Take 10 mg by mouth daily., Disp: , Rfl:  .  aspirin 81 MG tablet, Take 81 mg by mouth daily., Disp: , Rfl:  .  beclomethasone (QVAR REDIHALER) 80 MCG/ACT inhaler, Inhale 2 puffs into the lungs 2 (two) times daily., Disp: 1 Inhaler, Rfl: 11 .  calcium carbonate (TUMS - DOSED IN MG ELEMENTAL CALCIUM) 500 MG chewable tablet, Chew 1 tablet by mouth daily as needed for indigestion or heartburn., Disp: , Rfl:  .  Calcium-Magnesium-Zinc (CAL-MAG-ZINC PO), Take 2 tablets by mouth daily., Disp: , Rfl:  .   dextromethorphan (DELSYM) 30 MG/5ML liquid, Take 60 mg by mouth as needed for cough., Disp: , Rfl:  .  diclofenac (VOLTAREN) 75 MG EC tablet, Take 75 mg by mouth 2 (two) times daily as needed for pain., Disp: , Rfl: 0 .  famotidine (PEPCID) 10 MG tablet, Take 10 mg by mouth daily., Disp: , Rfl:  .  ferrous sulfate 325 (65 FE) MG tablet, Take 325 mg by mouth daily. , Disp: , Rfl:  .  fluticasone (FLONASE) 50 MCG/ACT nasal spray, Place 1 spray into both nostrils 2 (two) times daily. , Disp: , Rfl:  .  hydrochlorothiazide (HYDRODIURIL) 12.5 MG tablet, Take 12.5 mg by mouth daily., Disp: , Rfl:  .  ibuprofen (ADVIL,MOTRIN) 200 MG tablet, Take 600 mg by mouth daily as needed for headache or moderate pain., Disp: , Rfl:  .  levocetirizine (XYZAL) 5 MG tablet, Take 5 mg by mouth every evening., Disp: , Rfl:  .  montelukast (SINGULAIR) 5 MG chewable tablet, Chew 5 mg by mouth at bedtime., Disp: , Rfl:  .  Montelukast Sodium (SINGULAIR PO), Take 5 mg by mouth daily., Disp: , Rfl:  .  Omega-3 Fatty Acids (FISH OIL ULTRA) 1400 MG CAPS, Take 2,800 mg by mouth 2 (two) times daily., Disp: , Rfl:  .  Phenylephrine-APAP-Guaifenesin (MUCINEX FAST-Eldonna Neuenfeldt) 10-650-400 MG/20ML LIQD, Take 20 mLs by mouth daily as needed (cough)., Disp: , Rfl:  .  potassium chloride SA (K-DUR,KLOR-CON) 20 MEQ tablet, Take 20 mEq by mouth daily., Disp: , Rfl:  .  sildenafil (VIAGRA) 50 MG tablet, Take 50 mg by mouth daily as needed for erectile dysfunction., Disp: , Rfl:  .  sodium chloride (OCEAN) 0.65 % SOLN nasal spray, Place 1 spray into both nostrils every evening., Disp: , Rfl:  .  Tamsulosin HCl (FLOMAX) 0.4 MG CAPS, Take 0.8 mg by mouth daily. , Disp: , Rfl:   Allergies  Allergen Reactions  . Lisinopril     cough  . Augmentin [Amoxicillin-Pot Clavulanate] Rash    Has patient had a PCN reaction causing immediate rash, facial/tongue/throat swelling, SOB or lightheadedness with hypotension: Yes Has patient had a PCN reaction  causing severe rash involving mucus membranes or skin necrosis: No Has patient had a PCN reaction that required hospitalization: No Has patient had a PCN reaction occurring within the last 10 years: Yes If all of the above answers are "NO", then may proceed with Cephalosporin use.   Marland Kitchen Doxycycline Rash   Review of Systems Objective:   Vitals:   10/25/17 0954  BP: (!) 151/84  Pulse: 83  Resp: 16    General: Well developed, nourished, in no acute distress, alert and oriented x3   Dermatological: Skin is warm, dry and supple bilateral. Nails x 10 are well maintained; remaining integument appears unremarkable at this time. There are no open sores, no preulcerative lesions, no rash or signs of infection present.  Vascular: Dorsalis Pedis artery and Posterior Tibial artery pedal pulses are 2/4 bilateral with immedate capillary fill time. Pedal hair growth present. No varicosities and no lower extremity edema present bilateral.   Neruologic: Grossly intact via light touch bilateral. Vibratory intact via tuning fork bilateral. Protective threshold with Semmes Wienstein monofilament intact to all pedal sites bilateral. Patellar and Achilles deep tendon reflexes 2+ bilateral. No Babinski or clonus noted bilateral.   Musculoskeletal: No gross boney pedal deformities bilateral. No pain, crepitus, or limitation noted with foot and ankle range of motion bilateral. Muscular strength 5/5 in all groups tested bilateral.  Painful arthritic hammertoe deformities second and third digits of the left foot.  Moderate tenderness on palpation and end range of motion of the second and third metatarsal phalangeal joints left foot.  Gait: Unassisted, Nonantalgic.    Radiographs:  Radiographs taken today left foot demonstrates an osseously mature foot with hammertoe deformity second and third toes left.  Assessment & Plan:   Assessment: Hammertoe deformity capsulitis second and third digits of the left  foot.  Plan: Discussed etiology pathology conservative versus surgical therapies at this point we consented him for a second metatarsal osteotomy hammertoe repair with screw fixation #2 #3 of the left foot all after discussing alternative therapies including orthotics and injection therapy.  At this point we discussed the possible side effects of postop complications which may include but are not limited to postop pain bleeding swelling infection recurrence need further surgery overcorrection under correction.  He signed all of the consent form was dispensed a Cam walker and instructions regarding preop the surgery center and anesthesia group.     Koula Venier T. [place], [place]

## 2017-12-29 ENCOUNTER — Other Ambulatory Visit: Payer: Self-pay | Admitting: Podiatry

## 2017-12-29 MED ORDER — CLINDAMYCIN HCL 150 MG PO CAPS: 150.0000 mg | ORAL_CAPSULE | Freq: Three times a day (TID) | ORAL | 0 refills | Status: DC

## 2017-12-29 MED ORDER — ONDANSETRON HCL 4 MG PO TABS: 4.0000 mg | ORAL_TABLET | Freq: Three times a day (TID) | ORAL | 0 refills | Status: DC | PRN

## 2017-12-29 MED ORDER — OXYCODONE-ACETAMINOPHEN 10-325 MG PO TABS: 1.0000 | ORAL_TABLET | Freq: Four times a day (QID) | ORAL | 0 refills | Status: AC | PRN

## 2017-12-30 ENCOUNTER — Encounter: Payer: Self-pay | Admitting: Podiatry

## 2017-12-30 DIAGNOSIS — M2042 Other hammer toe(s) (acquired), left foot: Secondary | ICD-10-CM

## 2017-12-30 DIAGNOSIS — M21542 Acquired clubfoot, left foot: Secondary | ICD-10-CM

## 2018-01-05 ENCOUNTER — Ambulatory Visit (INDEPENDENT_AMBULATORY_CARE_PROVIDER_SITE_OTHER): Payer: Self-pay | Admitting: Podiatry

## 2018-01-05 ENCOUNTER — Ambulatory Visit (INDEPENDENT_AMBULATORY_CARE_PROVIDER_SITE_OTHER): Payer: BC Managed Care – PPO

## 2018-01-05 VITALS — Temp 97.8°F

## 2018-01-05 DIAGNOSIS — M778 Other enthesopathies, not elsewhere classified: Secondary | ICD-10-CM

## 2018-01-05 DIAGNOSIS — M2042 Other hammer toe(s) (acquired), left foot: Secondary | ICD-10-CM

## 2018-01-05 DIAGNOSIS — M779 Enthesopathy, unspecified: Secondary | ICD-10-CM

## 2018-01-05 NOTE — Progress Notes (Signed)
He presents today date of surgery 12/30/2017 status post metatarsal osteotomy #2 hammertoe repair and #2 #3 of the left foot with a tenotomy fourth toe left foot.  Denies fever chills nausea vomiting muscle aches pains calf pain back pain chest pain shortness of breath.  Objective: Vital signs are stable alert and oriented x3.  Pulses are palpable.  Neurologic system is intact.  Rest of dressing intact was removed demonstrates no erythematous mild edema no cellulitis drainage or odor sutures are intact margins appear to be well coapted.  Radiographs confirm well-placed osteotomies and internal fixation toes and second metatarsal.  Assessment: Well-healing surgical foot.  Plan: Follow-up with me in 1 week.  Redressed today dressed a compressive dressing

## 2018-01-12 ENCOUNTER — Ambulatory Visit (INDEPENDENT_AMBULATORY_CARE_PROVIDER_SITE_OTHER): Payer: BC Managed Care – PPO | Admitting: Podiatry

## 2018-01-12 DIAGNOSIS — M779 Enthesopathy, unspecified: Secondary | ICD-10-CM | POA: Diagnosis not present

## 2018-01-12 DIAGNOSIS — M2042 Other hammer toe(s) (acquired), left foot: Secondary | ICD-10-CM

## 2018-01-12 DIAGNOSIS — M778 Other enthesopathies, not elsewhere classified: Secondary | ICD-10-CM

## 2018-01-14 NOTE — Progress Notes (Signed)
He presents today for his postop visit date of surgery 12/30/2017 status post metatarsal osteotomy second left with hammertoe repair #2 #3 of the left foot.  He states that he still is kind of tender and painful.  Denies fever chills nausea vomiting muscle aches and pains.  Objective: Vital signs are stable he is alert and oriented x3.  Pulses are palpable.  Mild edema about the forefoot.  Second toe seems to be wanting to sit up just a little bit more than likely due to the contracture of the scar.  The toes are rectus and in good position.  Assessment: Well-healing surgical foot.  Plan: Follow-up with me in 2 weeks continue use of the Darco shoe.

## 2018-01-19 ENCOUNTER — Ambulatory Visit (INDEPENDENT_AMBULATORY_CARE_PROVIDER_SITE_OTHER): Payer: BC Managed Care – PPO | Admitting: Podiatry

## 2018-01-19 DIAGNOSIS — M779 Enthesopathy, unspecified: Secondary | ICD-10-CM

## 2018-01-19 DIAGNOSIS — M778 Other enthesopathies, not elsewhere classified: Secondary | ICD-10-CM

## 2018-01-19 DIAGNOSIS — M2042 Other hammer toe(s) (acquired), left foot: Secondary | ICD-10-CM

## 2018-01-19 DIAGNOSIS — Z9889 Other specified postprocedural states: Secondary | ICD-10-CM

## 2018-01-23 NOTE — Progress Notes (Signed)
Subjective:  Patient ID: Tyler [name], male    DOB: 05-02-1955,  MRN: [id number]  Chief Complaint  Patient presents with  . Routine Post Op    Suture Removal -  pov#2 dos 12.06.2019 Metatarsal Osteotomy 2nd Lt, Hammertoe Repair 2&3 Lt -Dr. Milinda Pointer    DOS: 12/30/17 Procedure: Left second tarsal shortening osteotomy, second third hammertoe repair  62 y.o. male returns for post-op check.  Doing well, pain controlled denies postop issues  Review of Systems: Negative except as noted in the HPI. Denies N/V/F/Ch.  Past Medical History:  Diagnosis Date  . Arthritis   . Asthma   . Complication of anesthesia 08/17/2016   pt had urinary retention following last sinus surgery that required him to go to the ED to be in and out cathedx1  . History of hiatal hernia   . Hypertension   . Sleep apnea     Current Outpatient Medications:  .  amLODipine (NORVASC) 10 MG tablet, Take 10 mg by mouth daily., Disp: , Rfl:  .  aspirin 81 MG tablet, Take 81 mg by mouth daily., Disp: , Rfl:  .  beclomethasone (QVAR REDIHALER) 80 MCG/ACT inhaler, Inhale 2 puffs into the lungs 2 (two) times daily., Disp: 1 Inhaler, Rfl: 11 .  calcium carbonate (TUMS - DOSED IN MG ELEMENTAL CALCIUM) 500 MG chewable tablet, Chew 1 tablet by mouth daily as needed for indigestion or heartburn., Disp: , Rfl:  .  Calcium-Magnesium-Zinc (CAL-MAG-ZINC PO), Take 2 tablets by mouth daily., Disp: , Rfl:  .  clindamycin (CLEOCIN) 150 MG capsule, Take 1 capsule (150 mg total) by mouth 3 (three) times daily., Disp: 30 capsule, Rfl: 0 .  dextromethorphan (DELSYM) 30 MG/5ML liquid, Take 60 mg by mouth as needed for cough., Disp: , Rfl:  .  diclofenac (VOLTAREN) 75 MG EC tablet, Take 75 mg by mouth 2 (two) times daily as needed for pain., Disp: , Rfl: 0 .  famotidine (PEPCID) 10 MG tablet, Take 10 mg by mouth daily., Disp: , Rfl:  .  ferrous sulfate 325 (65 FE) MG tablet, Take 325 mg by mouth daily. , Disp: , Rfl:  .  fluticasone  (FLONASE) 50 MCG/ACT nasal spray, Place 1 spray into both nostrils 2 (two) times daily. , Disp: , Rfl:  .  hydrochlorothiazide (HYDRODIURIL) 12.5 MG tablet, Take 12.5 mg by mouth daily., Disp: , Rfl:  .  ibuprofen (ADVIL,MOTRIN) 200 MG tablet, Take 600 mg by mouth daily as needed for headache or moderate pain., Disp: , Rfl:  .  levocetirizine (XYZAL) 5 MG tablet, Take 5 mg by mouth every evening., Disp: , Rfl:  .  montelukast (SINGULAIR) 5 MG chewable tablet, Chew 5 mg by mouth at bedtime., Disp: , Rfl:  .  Montelukast Sodium (SINGULAIR PO), Take 5 mg by mouth daily., Disp: , Rfl:  .  Omega-3 Fatty Acids (FISH OIL ULTRA) 1400 MG CAPS, Take 2,800 mg by mouth 2 (two) times daily., Disp: , Rfl:  .  ondansetron (ZOFRAN) 4 MG tablet, Take 1 tablet (4 mg total) by mouth every 8 (eight) hours as needed for nausea or vomiting., Disp: 20 tablet, Rfl: 0 .  Phenylephrine-APAP-Guaifenesin (MUCINEX FAST-MAX) 10-650-400 MG/20ML LIQD, Take 20 mLs by mouth daily as needed (cough)., Disp: , Rfl:  .  potassium chloride SA (K-DUR,KLOR-CON) 20 MEQ tablet, Take 20 mEq by mouth daily., Disp: , Rfl:  .  sildenafil (VIAGRA) 50 MG tablet, Take 50 mg by mouth daily as needed for erectile dysfunction., Disp: ,  Rfl:  .  sodium chloride (OCEAN) 0.65 % SOLN nasal spray, Place 1 spray into both nostrils every evening., Disp: , Rfl:  .  Tamsulosin HCl (FLOMAX) 0.4 MG CAPS, Take 0.8 mg by mouth daily. , Disp: , Rfl:   Social History   Tobacco Use  Smoking Status Never Smoker  Smokeless Tobacco Never Used  Tobacco Comment   occasional cigar    Allergies  Allergen Reactions  . Lisinopril     cough  . Augmentin [Amoxicillin-Pot Clavulanate] Rash    Has patient had a PCN reaction causing immediate rash, facial/tongue/throat swelling, SOB or lightheadedness with hypotension: Yes Has patient had a PCN reaction causing severe rash involving mucus membranes or skin necrosis: No Has patient had a PCN reaction that required  hospitalization: No Has patient had a PCN reaction occurring within the last 10 years: Yes If all of the above answers are "NO", then may proceed with Cephalosporin use.   Marland Kitchen Doxycycline Rash   Objective:  There were no vitals filed for this visit. There is no height or weight on file to calculate BMI. Constitutional Well developed. Well nourished.  Vascular Foot warm and well perfused. Capillary refill normal to all digits.   Neurologic Normal speech. Oriented to person, place, and time. Epicritic sensation to light touch grossly present bilaterally.  Dermatologic  Slight eschar present about the proximal aspect of the second MPJ, eschar left intact.  Skin overlying the second third toes well coapted and healing well.  No signs of warmth erythema or acute infection  Orthopedic: Tenderness to palpation noted about the surgical site. Toes rectus, dorsal flexion deformity second MPJ   Radiographs: None today Assessment:   1. Post-operative state   2. Capsulitis of foot, left   3. Hammer toe of left foot    Plan:  Patient was evaluated and treated and all questions answered.  S/p foot surgery right -Progressing as expected post-operatively. -XR: None -WB Status: Weight-bear as tolerated in boot surgical shoe -Sutures: Removed third toe left intact second toe and metatarsal.  Left intact due to the eschar present proximally -Medications: None refilled -Foot redressed.  Return in about 1 week (around 01/26/2018) for Dr. Milinda Pointer.

## 2018-01-26 ENCOUNTER — Other Ambulatory Visit: Payer: Self-pay | Admitting: Podiatry

## 2018-01-26 ENCOUNTER — Ambulatory Visit (INDEPENDENT_AMBULATORY_CARE_PROVIDER_SITE_OTHER): Payer: BC Managed Care – PPO

## 2018-01-26 ENCOUNTER — Ambulatory Visit (INDEPENDENT_AMBULATORY_CARE_PROVIDER_SITE_OTHER): Payer: Self-pay | Admitting: Podiatry

## 2018-01-26 DIAGNOSIS — Z9889 Other specified postprocedural states: Secondary | ICD-10-CM

## 2018-01-26 DIAGNOSIS — M778 Other enthesopathies, not elsewhere classified: Secondary | ICD-10-CM

## 2018-01-26 DIAGNOSIS — M2042 Other hammer toe(s) (acquired), left foot: Secondary | ICD-10-CM

## 2018-01-26 DIAGNOSIS — M779 Enthesopathy, unspecified: Principal | ICD-10-CM

## 2018-01-26 NOTE — Progress Notes (Signed)
Subjective:  Patient ID: Tyler [name], male    DOB: 04-30-1955,  MRN: [id number]  Chief Complaint  Patient presents with  . Routine Post Op    dos 12.06.2019 Metatarsal Osteotomy 2nd Lt, Hammertoe Repair 2&3 Lt   "Its doing better"  . Suture / Staple Removal    Removed remaining sutures from 2nd toe left     DOS: 12/30/17 Procedure: Left second tarsal shortening osteotomy, second third hammertoe repair  63 y.o. male returns for post-op check.  Doing well, denies issues today.  Review of Systems: Negative except as noted in the HPI. Denies N/V/F/Ch.  Past Medical History:  Diagnosis Date  . Arthritis   . Asthma   . Complication of anesthesia 08/17/2016   pt had urinary retention following last sinus surgery that required him to go to the ED to be in and out cathedx1  . History of hiatal hernia   . Hypertension   . Sleep apnea     Current Outpatient Medications:  .  amLODipine (NORVASC) 10 MG tablet, Take 10 mg by mouth daily., Disp: , Rfl:  .  aspirin 81 MG tablet, Take 81 mg by mouth daily., Disp: , Rfl:  .  beclomethasone (QVAR REDIHALER) 80 MCG/ACT inhaler, Inhale 2 puffs into the lungs 2 (two) times daily., Disp: 1 Inhaler, Rfl: 11 .  calcium carbonate (TUMS - DOSED IN MG ELEMENTAL CALCIUM) 500 MG chewable tablet, Chew 1 tablet by mouth daily as needed for indigestion or heartburn., Disp: , Rfl:  .  Calcium-Magnesium-Zinc (CAL-MAG-ZINC PO), Take 2 tablets by mouth daily., Disp: , Rfl:  .  dextromethorphan (DELSYM) 30 MG/5ML liquid, Take 60 mg by mouth as needed for cough., Disp: , Rfl:  .  diclofenac (VOLTAREN) 75 MG EC tablet, Take 75 mg by mouth 2 (two) times daily as needed for pain., Disp: , Rfl: 0 .  famotidine (PEPCID) 10 MG tablet, Take 10 mg by mouth daily., Disp: , Rfl:  .  ferrous sulfate 325 (65 FE) MG tablet, Take 325 mg by mouth daily. , Disp: , Rfl:  .  fluticasone (FLONASE) 50 MCG/ACT nasal spray, Place 1 spray into both nostrils 2 (two) times daily. ,  Disp: , Rfl:  .  hydrochlorothiazide (HYDRODIURIL) 12.5 MG tablet, Take 12.5 mg by mouth daily., Disp: , Rfl:  .  ibuprofen (ADVIL,MOTRIN) 200 MG tablet, Take 600 mg by mouth daily as needed for headache or moderate pain., Disp: , Rfl:  .  levocetirizine (XYZAL) 5 MG tablet, Take 5 mg by mouth every evening., Disp: , Rfl:  .  montelukast (SINGULAIR) 5 MG chewable tablet, Chew 5 mg by mouth at bedtime., Disp: , Rfl:  .  Montelukast Sodium (SINGULAIR PO), Take 5 mg by mouth daily., Disp: , Rfl:  .  Omega-3 Fatty Acids (FISH OIL ULTRA) 1400 MG CAPS, Take 2,800 mg by mouth 2 (two) times daily., Disp: , Rfl:  .  ondansetron (ZOFRAN) 4 MG tablet, Take 1 tablet (4 mg total) by mouth every 8 (eight) hours as needed for nausea or vomiting., Disp: 20 tablet, Rfl: 0 .  Phenylephrine-APAP-Guaifenesin (MUCINEX FAST-MAX) 10-650-400 MG/20ML LIQD, Take 20 mLs by mouth daily as needed (cough)., Disp: , Rfl:  .  potassium chloride SA (K-DUR,KLOR-CON) 20 MEQ tablet, Take 20 mEq by mouth daily., Disp: , Rfl:  .  sildenafil (VIAGRA) 50 MG tablet, Take 50 mg by mouth daily as needed for erectile dysfunction., Disp: , Rfl:  .  sodium chloride (OCEAN) 0.65 % SOLN nasal  spray, Place 1 spray into both nostrils every evening., Disp: , Rfl:  .  Tamsulosin HCl (FLOMAX) 0.4 MG CAPS, Take 0.8 mg by mouth daily. , Disp: , Rfl:   Social History   Tobacco Use  Smoking Status Never Smoker  Smokeless Tobacco Never Used  Tobacco Comment   occasional cigar    Allergies  Allergen Reactions  . Lisinopril     cough  . Augmentin [Amoxicillin-Pot Clavulanate] Rash    Has patient had a PCN reaction causing immediate rash, facial/tongue/throat swelling, SOB or lightheadedness with hypotension: Yes Has patient had a PCN reaction causing severe rash involving mucus membranes or skin necrosis: No Has patient had a PCN reaction that required hospitalization: No Has patient had a PCN reaction occurring within the last 10 years:  Yes If all of the above answers are "NO", then may proceed with Cephalosporin use.   Marland Kitchen Doxycycline Rash   Objective:  There were no vitals filed for this visit. There is no height or weight on file to calculate BMI. Constitutional Well developed. Well nourished.  Vascular Foot warm and well perfused. Capillary refill normal to all digits.   Neurologic Normal speech. Oriented to person, place, and time. Epicritic sensation to light touch grossly present bilaterally.  Dermatologic Slight eschar present about the proximal aspect of the second MPJ, improved since prior. No signs of acute infection.  Orthopedic: Tenderness to palpation noted about the surgical site. Toes rectus, dorsal flexion deformity second/3rd MPJ   Radiographs: Taken and reviewed good alignment without HW failure. Bridging appreciated. Assessment:   1. Capsulitis of foot, left   2. Hammer toe of left foot   3. Post-operative state    Plan:  Patient was evaluated and treated and all questions answered.  S/p foot surgery right -Progressing as expected post-operatively. -XR: As above -WB Status: Weight-bear as tolerated in boot surgical shoe -Sutures: Remaining sutures removed. -Continue abx ointment and band-aid to scabbed area daily. -Medications: None refilled -Foot redressed. -Ok to shower but not soak. -Compression anklet dispensed.   Return in about 3 weeks (around 02/16/2018) for hyatt patient.

## 2018-02-09 ENCOUNTER — Ambulatory Visit (INDEPENDENT_AMBULATORY_CARE_PROVIDER_SITE_OTHER): Payer: Self-pay | Admitting: Podiatry

## 2018-02-09 ENCOUNTER — Ambulatory Visit (INDEPENDENT_AMBULATORY_CARE_PROVIDER_SITE_OTHER): Payer: BC Managed Care – PPO

## 2018-02-09 DIAGNOSIS — M779 Enthesopathy, unspecified: Secondary | ICD-10-CM

## 2018-02-09 DIAGNOSIS — M778 Other enthesopathies, not elsewhere classified: Secondary | ICD-10-CM

## 2018-02-09 DIAGNOSIS — M2042 Other hammer toe(s) (acquired), left foot: Secondary | ICD-10-CM | POA: Diagnosis not present

## 2018-02-09 DIAGNOSIS — Z9889 Other specified postprocedural states: Secondary | ICD-10-CM

## 2018-02-09 NOTE — Progress Notes (Signed)
He presents today for postop visit date of surgery 12/30/2017 status post second metatarsal osteotomy hammertoe repair #2 #3 of the left foot states that he is concerned about the swelling in the small open wound is present.  As well as the toes not approaching the floor.  Objective: Vital signs are stable he is alert and oriented x3.  Small ulcerative lesion at the proximal most aspect of the incision most likely this was a small abscess from a stitch but is not healing nonetheless.  I debrided the area today.  There was minimal bleeding.  No purulence no malodor.  Swelling about the forefoot.  Assessment: Well-healing surgical foot in general small stitch abscess that is nonhealing at this point.  Plan: I provided him with a sterile brush to wash this and debrided with on a daily basis also provided him with small amount of Iodosorb gel and told him how to place a dressing.  I would like for him to continue the Darco shoe until follow-up with him I explained to him that we may need to numb the area up and debrided sharply.  He understands is amenable to it if necessary.

## 2018-02-23 ENCOUNTER — Encounter: Payer: Self-pay | Admitting: Podiatry

## 2018-02-23 ENCOUNTER — Ambulatory Visit (INDEPENDENT_AMBULATORY_CARE_PROVIDER_SITE_OTHER): Payer: BC Managed Care – PPO

## 2018-02-23 ENCOUNTER — Ambulatory Visit (INDEPENDENT_AMBULATORY_CARE_PROVIDER_SITE_OTHER): Payer: BC Managed Care – PPO | Admitting: Podiatry

## 2018-02-23 DIAGNOSIS — M2042 Other hammer toe(s) (acquired), left foot: Secondary | ICD-10-CM

## 2018-02-23 DIAGNOSIS — M216X2 Other acquired deformities of left foot: Secondary | ICD-10-CM

## 2018-02-23 DIAGNOSIS — Z9889 Other specified postprocedural states: Secondary | ICD-10-CM

## 2018-02-23 MED ORDER — CADEXOMER IODINE 0.9 % EX GEL: 1.0000 "application " | Freq: Every day | CUTANEOUS | 0 refills | Status: DC | PRN

## 2018-02-23 NOTE — Progress Notes (Signed)
He presents today for follow-up of his hammertoe repair #2 #3 of his left foot he had a small area of dehiscence at the proximal most aspect of his second metatarsal osteotomy incision site but states that it seems to be doing much better and he continues to apply the Iodosorb daily.  Objective: Vital signs are stable he is alert and oriented x3.  Pulses are palpable.  His toes are sitting rectus that his second toe is slightly elevated and I think is more than likely due to the swelling and the scar tissue this around the second metatarsal phalangeal joint.  The wound appears to be healing very nicely I debrided that today to bleeding place another small amount of Iodosorb and a dressing over it.  Assessment: Well-healing surgical toes per radiograph and a small ulcerative lesion in the proximal dorsal aspect of the foot appears to be healing.  Plan: Dressed a compressive dressing to the ulcerative lesion today continue the use of the Iodosorb gel which we provided him today.  I also provided him with a Darco digital splint to help plantarflexed that his toes.

## 2018-02-24 ENCOUNTER — Telehealth: Payer: Self-pay | Admitting: *Deleted

## 2018-02-24 NOTE — Telephone Encounter (Signed)
Left message informing pt that I had reviewed his LOV and Dr. Milinda Pointer had given him a sample of the medication he was prescribed and if he needed we could provide a sample.

## 2018-02-24 NOTE — Telephone Encounter (Signed)
Pt contacted Lezlie Octave through [email or web address] 02/24/2018 10:33am. Pt's message states the medication prescribed yesterday is about $173.

## 2018-03-09 ENCOUNTER — Ambulatory Visit (INDEPENDENT_AMBULATORY_CARE_PROVIDER_SITE_OTHER): Payer: BC Managed Care – PPO

## 2018-03-09 ENCOUNTER — Ambulatory Visit (INDEPENDENT_AMBULATORY_CARE_PROVIDER_SITE_OTHER): Payer: BC Managed Care – PPO | Admitting: Podiatry

## 2018-03-09 ENCOUNTER — Encounter: Payer: Self-pay | Admitting: Podiatry

## 2018-03-09 DIAGNOSIS — M2042 Other hammer toe(s) (acquired), left foot: Secondary | ICD-10-CM

## 2018-03-09 DIAGNOSIS — Z9889 Other specified postprocedural states: Secondary | ICD-10-CM

## 2018-03-09 DIAGNOSIS — M216X2 Other acquired deformities of left foot: Secondary | ICD-10-CM | POA: Diagnosis not present

## 2018-03-09 NOTE — Progress Notes (Signed)
Presents today date of surgery 12/30/2017 status post metatarsal osteotomy second left.  We had a small ulceration at the proximal most aspect of the second metatarsal osteotomy incision site which is doing better he says.  He has been applying Iodosorb daily.  Objective: Vitals are stable alert oriented x3.  Pulses are palpable.  Neurologic sensorium is intact.  DP reflexes are intact.  Muscle strength is 5/5 dorsiflexion plantarflexion inverters everters onto the musculature is intact toes #2 and 3 are rectus in sitting in good position they are slightly elevated but should come on down over the next few weeks to months.  The wound on the dorsal aspect of the foot no longer demonstrates any surrounding erythema and appears to have nearly healed 100%.  I debrided all necrotic tissue from around it today does not appear to be opening does not appear to probe.  Assessment: Well-healing surgical toes #2 #3 of the left foot.  Plan: At this point I will ask him to continue all conservative therapies and dressing for the next week and then I will follow-up with him in 3 weeks or in 7 weeks whichever fits his schedule best.

## 2018-03-30 ENCOUNTER — Encounter: Payer: BC Managed Care – PPO | Admitting: Podiatry

## 2018-04-04 ENCOUNTER — Encounter: Payer: Self-pay | Admitting: Podiatry

## 2018-04-04 ENCOUNTER — Ambulatory Visit (INDEPENDENT_AMBULATORY_CARE_PROVIDER_SITE_OTHER): Payer: BC Managed Care – PPO

## 2018-04-04 ENCOUNTER — Ambulatory Visit (INDEPENDENT_AMBULATORY_CARE_PROVIDER_SITE_OTHER): Payer: BC Managed Care – PPO | Admitting: Podiatry

## 2018-04-04 DIAGNOSIS — M2042 Other hammer toe(s) (acquired), left foot: Secondary | ICD-10-CM

## 2018-04-04 DIAGNOSIS — M216X2 Other acquired deformities of left foot: Secondary | ICD-10-CM

## 2018-04-04 DIAGNOSIS — Z9889 Other specified postprocedural states: Secondary | ICD-10-CM

## 2018-04-04 NOTE — Progress Notes (Signed)
He presents today date of surgery 12/30/2017 status post second metatarsal osteotomy left hammertoe repair #2 #3 of the left foot.  He states that is doing much better he is very happy with the outcome.  Objective: Vital signs are stable he is alert and oriented x3.  Pulses are palpable.  Toes are rectus wound is completely healed to the dorsal aspect of the foot there is there is some scar tissue that is limiting the second toe from sitting down completely.  He also has osteoarthritic change with a plantarflexed second PIPJ fourth digit left foot.  Assessment: Well-healing surgical foot.  Plan: Discussed etiology pathology and surgical therapies at this point in time I discussed massage therapy to help breakdown the reactive hyperkeratotic tissue and I will follow-up with him on an as-needed basis.

## 2018-10-23 ENCOUNTER — Other Ambulatory Visit: Payer: Self-pay

## 2018-10-23 ENCOUNTER — Ambulatory Visit: Payer: BC Managed Care – PPO | Admitting: Adult Health

## 2018-10-23 VITALS — BP 149/88 | HR 71 | Temp 98.0°F | Ht 70.0 in | Wt 228.6 lb

## 2018-10-23 DIAGNOSIS — G4733 Obstructive sleep apnea (adult) (pediatric): Secondary | ICD-10-CM

## 2018-10-23 DIAGNOSIS — Z9989 Dependence on other enabling machines and devices: Secondary | ICD-10-CM | POA: Diagnosis not present

## 2018-10-23 NOTE — Progress Notes (Addendum)
PATIENT: Tyler [name] DOB: Sep 10, 1955  REASON FOR VISIT: follow up HISTORY FROM: patient  HISTORY OF PRESENT ILLNESS: Today 10/23/18:  Mr. Adriance is a 63 year old male with a history of obstructive sleep apnea on CPAP.  He returns today for follow-up.  His download indicates that he uses machine nightly for compliance of 100%.  Every night he uses machine greater than 4 hours.  On average he uses his machine 8 hours and 25 minutes.  His residual AHI is 3.2 on 5 to 12 cm of water with EPR of 1.  His leak in the 95th percentile is 12.7 L/min.  He reports that the CPAP is working well for him.  He definitely can tell the benefit.  He returns today for an evaluation.  HISTORY (copied from Dr. Guadelupe Sabin note) 10/20/2017: I reviewed his AutoPap compliance data from 09/21/2011 through 10/19/2017 which is a total of 30 days, during which time he used his AutoPap 28 days with percent used days greater than 4 hours at 93%, indicating excellent compliance with an average usage of 8 hours and 14 minutes, residual AHI at goal at 2.8 per hour, leak acceptable with the 95th percentile at 10.2 L/m, 95th percentile pressure at 10.4 cm with a range of 5 cm to 10 cm. Of note, his previous CPAP machine was set at 7 cm without EPR. He reports doing well, likes his new machine, has adapted well to the AutoPap, does not mind the slightly higher pressure. Had to adjust the humidity however. He does notice the difference in the mouth dryness compared to when he was on CPAP of 7 cm. Nevertheless, he likes his new nasal pillows and has adapted well to the AutoPap therapy. He has no questions today. He feels good about his sleep, still has nocturia which is about the same. He takes his Flomax 2 pills in the daytime, not at night. He has a routine checkup appointment with his primary care physician coming up. He had no interim changes to his medical history or medications.    REVIEW OF SYSTEMS: Out of a complete 14  system review of symptoms, the patient complains only of the following symptoms, and all other reviewed systems are negative.  Epworth sleepiness score 7    ALLERGIES: Allergies  Allergen Reactions  . Lisinopril     cough  . Augmentin [Amoxicillin-Pot Clavulanate] Rash    Has patient had a PCN reaction causing immediate rash, facial/tongue/throat swelling, SOB or lightheadedness with hypotension: Yes Has patient had a PCN reaction causing severe rash involving mucus membranes or skin necrosis: No Has patient had a PCN reaction that required hospitalization: No Has patient had a PCN reaction occurring within the last 10 years: Yes If all of the above answers are "NO", then may proceed with Cephalosporin use.   Marland Kitchen Doxycycline Rash    HOME MEDICATIONS: Outpatient Medications Prior to Visit  Medication Sig Dispense Refill  . amLODipine (NORVASC) 10 MG tablet Take 10 mg by mouth daily.    Marland Kitchen aspirin 81 MG tablet Take 81 mg by mouth daily.    . beclomethasone (QVAR REDIHALER) 80 MCG/ACT inhaler Inhale 2 puffs into the lungs 2 (two) times daily. 1 Inhaler 11  . calcium carbonate (TUMS - DOSED IN MG ELEMENTAL CALCIUM) 500 MG chewable tablet Chew 1 tablet by mouth daily as needed for indigestion or heartburn.    . Calcium-Magnesium-Zinc (CAL-MAG-ZINC PO) Take 2 tablets by mouth daily.    . diclofenac (VOLTAREN) 75 MG  EC tablet Take 75 mg by mouth 2 (two) times daily as needed for pain.  0  . famotidine (PEPCID) 10 MG tablet Take 10 mg by mouth daily.    . ferrous sulfate 325 (65 FE) MG tablet Take 325 mg by mouth daily.     . fluticasone (FLONASE) 50 MCG/ACT nasal spray Place 1 spray into both nostrils 2 (two) times daily.     . hydrochlorothiazide (HYDRODIURIL) 12.5 MG tablet Take 12.5 mg by mouth daily.    Marland Kitchen ibuprofen (ADVIL,MOTRIN) 200 MG tablet Take 600 mg by mouth daily as needed for headache or moderate pain.    Marland Kitchen levocetirizine (XYZAL) 5 MG tablet Take 5 mg by mouth every evening.     . montelukast (SINGULAIR) 10 MG tablet TK 1 T PO D    . potassium chloride SA (K-DUR,KLOR-CON) 20 MEQ tablet Take 20 mEq by mouth daily.    . sildenafil (VIAGRA) 50 MG tablet Take 50 mg by mouth daily as needed for erectile dysfunction.    . sodium chloride (OCEAN) 0.65 % SOLN nasal spray Place 1 spray into both nostrils every evening.    . Tamsulosin HCl (FLOMAX) 0.4 MG CAPS Take 0.8 mg by mouth daily.     . cadexomer iodine (IODOSORB) 0.9 % gel Apply 1 application topically daily as needed for wound care. 40 g 0  . dextromethorphan (DELSYM) 30 MG/5ML liquid Take 60 mg by mouth as needed for cough.    . Omega-3 Fatty Acids (FISH OIL ULTRA) 1400 MG CAPS Take 2,800 mg by mouth 2 (two) times daily.    . ondansetron (ZOFRAN) 4 MG tablet Take 1 tablet (4 mg total) by mouth every 8 (eight) hours as needed for nausea or vomiting. 20 tablet 0  . Phenylephrine-APAP-Guaifenesin (MUCINEX FAST-MAX) 10-650-400 MG/20ML LIQD Take 20 mLs by mouth daily as needed (cough).     No facility-administered medications prior to visit.     PAST MEDICAL HISTORY: Past Medical History:  Diagnosis Date  . Arthritis   . Asthma   . Complication of anesthesia 08/17/2016   pt had urinary retention following last sinus surgery that required him to go to the ED to be in and out cathedx1  . History of hiatal hernia   . Hypertension   . Sleep apnea     PAST SURGICAL HISTORY: Past Surgical History:  Procedure Laterality Date  . NASAL SINUS SURGERY  2002  . ROTATOR CUFF REPAIR    . SEPTOPLASTY WITH ETHMOIDECTOMY, AND MAXILLARY ANTROSTOMY N/A 08/19/2016   Procedure: SEPTOPLASTY WITH ETHMOIDECTOMY, AND MAXILLARY ANTROSTOMY;  Surgeon: [name], MD;  Location: [facility];  Service: ENT;  Laterality: N/A;  . TURBINATE REDUCTION N/A 08/19/2016   Procedure: BILATERAL TURBINATE REDUCTION;  Surgeon: [name], MD;  Location: [facility];  Service: ENT;  Laterality: N/A;  . WISDOM TOOTH EXTRACTION      FAMILY HISTORY: Family  History  Problem Relation Age of Onset  . Aneurysm Father   . Heart failure Mother   . Colon cancer Neg Hx   . Esophageal cancer Neg Hx   . Rectal cancer Neg Hx   . Stomach cancer Neg Hx     SOCIAL HISTORY: Social History   Socioeconomic History  . Marital status: Married    Spouse name: Not on file  . Number of children: 2  . Years of education: Not on file  . Highest education level: Not on file  Occupational History  . Occupation: Pharmacist, hospital  Social Needs  .  Financial resource strain: Not on file  . Food insecurity    Worry: Not on file    Inability: Not on file  . Transportation needs    Medical: Not on file    Non-medical: Not on file  Tobacco Use  . Smoking status: Never Smoker  . Smokeless tobacco: Never Used  . Tobacco comment: occasional cigar  Substance and Sexual Activity  . Alcohol use: Yes    Alcohol/week: 5.0 standard drinks    Types: 5 Glasses of wine per week  . Drug use: No  . Sexual activity: Not on file  Lifestyle  . Physical activity    Days per week: Not on file    Minutes per session: Not on file  . Stress: Not on file  Relationships  . Social Herbalist on phone: Not on file    Gets together: Not on file    Attends religious service: Not on file    Active member of club or organization: Not on file    Attends meetings of clubs or organizations: Not on file    Relationship status: Not on file  . Intimate partner violence    Fear of current or ex partner: Not on file    Emotionally abused: Not on file    Physically abused: Not on file    Forced sexual activity: Not on file  Other Topics Concern  . Not on file  Social History Narrative  . Not on file      PHYSICAL EXAM  Vitals:   10/23/18 1014  BP: (!) 149/88  Pulse: 71  Temp: 98 F (36.7 C)  Weight: 228 lb 9.6 oz (103.7 kg)  Height: 5\' 10"  (1.778 m)   Body mass index is 32.8 kg/m.  Generalized: Well developed, in no acute distress  Chest: Lungs clear to  auscultation bilaterally  Neurological examination  Mentation: Alert oriented to time, place, history taking. Follows all commands speech and language fluent Cranial nerve II-XII: Extraocular movements were full, visual field were full on confrontational test Head turning and shoulder shrug  were normal and symmetric. Motor: The motor testing reveals 5 over 5 strength of all 4 extremities. Good symmetric motor tone is noted throughout.  Sensory: Sensory testing is intact to soft touch on all 4 extremities. No evidence of extinction is noted.  Gait and station: Gait is normal.    DIAGNOSTIC DATA (LABS, IMAGING, TESTING) - I reviewed patient records, labs, notes, testing and imaging myself where available.  Lab Results  Component Value Date   WBC 5.1 08/17/2016   HGB 14.3 08/17/2016   HCT 41.5 08/17/2016   MCV 80.9 08/17/2016   PLT 204 08/17/2016      Component Value Date/Time   NA 142 08/17/2016 0809   K 3.3 (L) 08/17/2016 0809   CL 106 08/17/2016 0809   CO2 28 08/17/2016 0809   GLUCOSE 103 (H) 08/17/2016 0809   BUN 12 08/17/2016 0809   CREATININE 0.81 08/17/2016 0809   CALCIUM 8.8 (L) 08/17/2016 0809   GFRNONAA >60 08/17/2016 0809   GFRAA >60 08/17/2016 0809      ASSESSMENT AND PLAN 63 y.o. year old male  has a past medical history of Arthritis, Asthma, Complication of anesthesia (08/17/2016), History of hiatal hernia, Hypertension, and Sleep apnea. here with:  1. Obstructive sleep apnea on CPAP  The patient's CPAP download shows excellent compliance and good treatment of his apnea.  He is encouraged to continue using CPAP nightly and  greater than 4 hours each night.  He is advised that if his symptoms worsen or he develops new symptoms he should let us know.  He will follow-up in 1 year or sooner if needed.    I spent 15 minutes with the patient. 50% of this time was spent reviewing CPAP download   Ward Givens, MSN, NP-C 10/23/2018, 10:21 AM [facility] [address], [place], [place] [address] [phone]  I reviewed the above note and documentation by the Nurse Practitioner and agree with the history, exam, assessment and plan as outlined above. I was available for consultation. Star Age, MD, PhD [facility])

## 2018-10-23 NOTE — Patient Instructions (Signed)
Continue using CPAP nightly and greater than 4 hours each night °If your symptoms worsen or you develop new symptoms please let us know.  ° °

## 2018-11-07 ENCOUNTER — Ambulatory Visit (INDEPENDENT_AMBULATORY_CARE_PROVIDER_SITE_OTHER)
Admission: RE | Admit: 2018-11-07 | Discharge: 2018-11-07 | Disposition: A | Discharge status: Home / Self Care | Payer: BC Managed Care – PPO | Source: Ambulatory Visit | Attending: Pulmonary Disease | Admitting: Pulmonary Disease

## 2018-11-07 ENCOUNTER — Other Ambulatory Visit: Payer: Self-pay

## 2018-11-07 DIAGNOSIS — R918 Other nonspecific abnormal finding of lung field: Secondary | ICD-10-CM | POA: Diagnosis not present

## 2018-11-09 ENCOUNTER — Other Ambulatory Visit: Payer: Self-pay | Admitting: Internal Medicine

## 2018-11-09 DIAGNOSIS — R109 Unspecified abdominal pain: Secondary | ICD-10-CM

## 2018-11-17 ENCOUNTER — Ambulatory Visit
Admission: RE | Admit: 2018-11-17 | Discharge: 2018-11-17 | Disposition: A | Discharge status: Home / Self Care | Payer: BC Managed Care – PPO | Source: Ambulatory Visit | Attending: Internal Medicine | Admitting: Internal Medicine

## 2018-11-17 DIAGNOSIS — K8020 Calculus of gallbladder without cholecystitis without obstruction: Secondary | ICD-10-CM | POA: Insufficient documentation

## 2018-11-17 DIAGNOSIS — R109 Unspecified abdominal pain: Secondary | ICD-10-CM

## 2018-11-17 DIAGNOSIS — K760 Fatty (change of) liver, not elsewhere classified: Secondary | ICD-10-CM | POA: Insufficient documentation

## 2019-10-23 ENCOUNTER — Ambulatory Visit: Payer: BC Managed Care – PPO | Admitting: Adult Health

## 2019-10-23 ENCOUNTER — Other Ambulatory Visit: Payer: Self-pay

## 2019-10-23 ENCOUNTER — Encounter: Payer: Self-pay | Admitting: Adult Health

## 2019-10-23 VITALS — BP 155/96 | HR 69 | Ht 70.0 in | Wt 226.0 lb

## 2019-10-23 DIAGNOSIS — G4733 Obstructive sleep apnea (adult) (pediatric): Secondary | ICD-10-CM | POA: Diagnosis not present

## 2019-10-23 DIAGNOSIS — Z9989 Dependence on other enabling machines and devices: Secondary | ICD-10-CM | POA: Diagnosis not present

## 2019-10-23 NOTE — Progress Notes (Addendum)
PATIENT: Tyler [name] DOB: 11-12-1955  REASON FOR VISIT: follow up HISTORY FROM: patient  HISTORY OF PRESENT ILLNESS: Today 10/23/19:  Ms. Tyler [name] is a 64 year old male with a history of obstructive sleep apnea on CPAP.  His download indicates that he uses machine nightly for compliance of 100%.  He uses machine greater than 4 hours each night.  On average he uses his machine 8 hours and 38 minutes.  His residual AHI is 2.6 on 5 to 12 cm of water.  His leak in the 95th percentile is 13.6 L/min.  He reports that the CPAP is working well for him.  He does not like to sleep without it.  HISTORY 10/23/18:  Mr. Tyler [name] is a 64 year old male with a history of obstructive sleep apnea on CPAP.  He returns today for follow-up.  His download indicates that he uses machine nightly for compliance of 100%.  Every night he uses machine greater than 4 hours.  On average he uses his machine 8 hours and 25 minutes.  His residual AHI is 3.2 on 5 to 12 cm of water with EPR of 1.  His leak in the 95th percentile is 12.7 L/min.  He reports that the CPAP is working well for him.  He definitely can tell the benefit.  He returns today for an evaluation.  REVIEW OF SYSTEMS: Out of a complete 14 system review of symptoms, the patient complains only of the following symptoms, and all other reviewed systems are negative.   ESS 7  ALLERGIES: Allergies  Allergen Reactions  . Lisinopril     cough  . Augmentin [Amoxicillin-Pot Clavulanate] Rash    Has patient had a PCN reaction causing immediate rash, facial/tongue/throat swelling, SOB or lightheadedness with hypotension: Yes Has patient had a PCN reaction causing severe rash involving mucus membranes or skin necrosis: No Has patient had a PCN reaction that required hospitalization: No Has patient had a PCN reaction occurring within the last 10 years: Yes If all of the above answers are "NO", then may proceed with Cephalosporin use.   Marland Kitchen  Doxycycline Rash    HOME MEDICATIONS: Outpatient Medications Prior to Visit  Medication Sig Dispense Refill  . amLODipine (NORVASC) 10 MG tablet Take 10 mg by mouth daily.    Marland Kitchen aspirin 81 MG tablet Take 81 mg by mouth daily.    . beclomethasone (QVAR REDIHALER) 80 MCG/ACT inhaler Inhale 2 puffs into the lungs 2 (two) times daily. 1 Inhaler 11  . calcium carbonate (TUMS - DOSED IN MG ELEMENTAL CALCIUM) 500 MG chewable tablet Chew 1 tablet by mouth daily as needed for indigestion or heartburn.    . Calcium-Magnesium-Zinc (CAL-MAG-ZINC PO) Take 2 tablets by mouth daily.    . famotidine (PEPCID) 10 MG tablet Take 10 mg by mouth daily.    . ferrous sulfate 325 (65 FE) MG tablet Take 325 mg by mouth daily.     . hydrochlorothiazide (HYDRODIURIL) 12.5 MG tablet Take 12.5 mg by mouth daily.    Marland Kitchen ibuprofen (ADVIL,MOTRIN) 200 MG tablet Take 600 mg by mouth daily as needed for headache or moderate pain.    Marland Kitchen levocetirizine (XYZAL) 5 MG tablet Take 5 mg by mouth every evening.    . montelukast (SINGULAIR) 10 MG tablet TK 1 T PO D    . potassium chloride SA (K-DUR,KLOR-CON) 20 MEQ tablet Take 20 mEq by mouth daily.    . sildenafil (VIAGRA) 50 MG tablet Take 50 mg by mouth daily as needed  for erectile dysfunction.    . sodium chloride (OCEAN) 0.65 % SOLN nasal spray Place 1 spray into both nostrils every evening.    . Tamsulosin HCl (FLOMAX) 0.4 MG CAPS Take 0.8 mg by mouth daily.     . diclofenac (VOLTAREN) 75 MG EC tablet Take 75 mg by mouth 2 (two) times daily as needed for pain.  0  . fluticasone (FLONASE) 50 MCG/ACT nasal spray Place 1 spray into both nostrils 2 (two) times daily.      No facility-administered medications prior to visit.    PAST MEDICAL HISTORY: Past Medical History:  Diagnosis Date  . Arthritis   . Asthma   . Complication of anesthesia 08/17/2016   pt had urinary retention following last sinus surgery that required him to go to the ED to be in and out cathedx1  . History of  hiatal hernia   . Hypertension   . Sleep apnea     PAST SURGICAL HISTORY: Past Surgical History:  Procedure Laterality Date  . NASAL SINUS SURGERY  2002  . ROTATOR CUFF REPAIR    . SEPTOPLASTY WITH ETHMOIDECTOMY, AND MAXILLARY ANTROSTOMY N/A 08/19/2016   Procedure: SEPTOPLASTY WITH ETHMOIDECTOMY, AND MAXILLARY ANTROSTOMY;  Surgeon: [name], MD;  Location: [facility];  Service: ENT;  Laterality: N/A;  . TURBINATE REDUCTION N/A 08/19/2016   Procedure: BILATERAL TURBINATE REDUCTION;  Surgeon: [name], MD;  Location: [facility];  Service: ENT;  Laterality: N/A;  . WISDOM TOOTH EXTRACTION      FAMILY HISTORY: Family History  Problem Relation Age of Onset  . Aneurysm Father   . Heart failure Mother   . Colon cancer Neg Hx   . Esophageal cancer Neg Hx   . Rectal cancer Neg Hx   . Stomach cancer Neg Hx     SOCIAL HISTORY: Social History   Socioeconomic History  . Marital status: Married    Spouse name: Not on file  . Number of children: 2  . Years of education: Not on file  . Highest education level: Not on file  Occupational History  . Occupation: Pharmacist, hospital  Tobacco Use  . Smoking status: Never Smoker  . Smokeless tobacco: Never Used  . Tobacco comment: occasional cigar  Vaping Use  . Vaping Use: Never used  Substance and Sexual Activity  . Alcohol use: Yes    Alcohol/week: 5.0 standard drinks    Types: 5 Glasses of wine per week  . Drug use: No  . Sexual activity: Not on file  Other Topics Concern  . Not on file  Social History Narrative  . Not on file   Social Determinants of Health   Financial Resource Strain:   . Difficulty of Paying Living Expenses: Not on file  Food Insecurity:   . Worried About Charity fundraiser in the Last Year: Not on file  . Ran Out of Food in the Last Year: Not on file  Transportation Needs:   . Lack of Transportation (Medical): Not on file  . Lack of Transportation (Non-Medical): Not on file  Physical Activity:   . Days of  Exercise per Week: Not on file  . Minutes of Exercise per Session: Not on file  Stress:   . Feeling of Stress : Not on file  Social Connections:   . Frequency of Communication with Friends and Family: Not on file  . Frequency of Social Gatherings with Friends and Family: Not on file  . Attends Religious Services: Not on file  .  Active Member of Clubs or Organizations: Not on file  . Attends Archivist Meetings: Not on file  . Marital Status: Not on file  Intimate Partner Violence:   . Fear of Current or Ex-Partner: Not on file  . Emotionally Abused: Not on file  . Physically Abused: Not on file  . Sexually Abused: Not on file      PHYSICAL EXAM  Vitals:   10/23/19 0854  BP: (!) 155/96  Pulse: 69  Weight: 226 lb (102.5 kg)  Height: 5\' 10"  (1.778 m)   Body mass index is 32.43 kg/m.  Generalized: Well developed, in no acute distress  Chest: Lungs clear to auscultation bilaterally  Neurological examination  Mentation: Alert oriented to time, place, history taking. Follows all commands speech and language fluent Cranial nerve II-XII: Extraocular movements were full, visual field were full on confrontational test Head turning and shoulder shrug  were normal and symmetric. Motor: The motor testing reveals 5 over 5 strength of all 4 extremities. Good symmetric motor tone is noted throughout.  Sensory: Sensory testing is intact to soft touch on all 4 extremities. No evidence of extinction is noted.  Gait and station: Gait is normal.    DIAGNOSTIC DATA (LABS, IMAGING, TESTING) - I reviewed patient records, labs, notes, testing and imaging myself where available.  Lab Results  Component Value Date   WBC 5.1 08/17/2016   HGB 14.3 08/17/2016   HCT 41.5 08/17/2016   MCV 80.9 08/17/2016   PLT 204 08/17/2016      Component Value Date/Time   NA 142 08/17/2016 0809   K 3.3 (L) 08/17/2016 0809   CL 106 08/17/2016 0809   CO2 28 08/17/2016 0809   GLUCOSE 103 (H)  08/17/2016 0809   BUN 12 08/17/2016 0809   CREATININE 0.81 08/17/2016 0809   CALCIUM 8.8 (L) 08/17/2016 0809   GFRNONAA >60 08/17/2016 0809   GFRAA >60 08/17/2016 0809      ASSESSMENT AND PLAN 64 y.o. year old male  has a past medical history of Arthritis, Asthma, Complication of anesthesia (08/17/2016), History of hiatal hernia, Hypertension, and Sleep apnea. here with:  1. OSA on CPAP  - CPAP compliance excellent - Good treatment of AHI  - Encourage patient to use CPAP nightly and > 4 hours each night - F/U in 1 year or sooner if needed   I spent 25 minutes of face-to-face and non-face-to-face time with patient.  This included previsit chart review, lab review, study review, order entry, electronic health record documentation, patient education.  Ward Givens, MSN, NP-C 10/23/2019, 9:18 AM [facility] [address] [place], [place] [address] [phone]  I reviewed the above note and documentation by the Nurse Practitioner and agree with the history, exam, assessment and plan as outlined above. I was available for consultation. Star Age, MD, PhD [facility])

## 2019-10-23 NOTE — Patient Instructions (Signed)

## 2020-03-11 DIAGNOSIS — H6123 Impacted cerumen, bilateral: Secondary | ICD-10-CM | POA: Insufficient documentation

## 2020-03-11 DIAGNOSIS — H900 Conductive hearing loss, bilateral: Secondary | ICD-10-CM | POA: Insufficient documentation

## 2020-05-06 ENCOUNTER — Ambulatory Visit (INDEPENDENT_AMBULATORY_CARE_PROVIDER_SITE_OTHER): Payer: Medicare PPO

## 2020-05-06 ENCOUNTER — Other Ambulatory Visit: Payer: Self-pay

## 2020-05-06 ENCOUNTER — Ambulatory Visit: Payer: Medicare PPO | Admitting: Podiatry

## 2020-05-06 DIAGNOSIS — E7800 Pure hypercholesterolemia, unspecified: Secondary | ICD-10-CM | POA: Insufficient documentation

## 2020-05-06 DIAGNOSIS — M2042 Other hammer toe(s) (acquired), left foot: Secondary | ICD-10-CM

## 2020-05-06 NOTE — Addendum Note (Signed)
Addended by: Clovis Riley E on: 05/06/2020 03:20 PM   Modules accepted: Orders

## 2020-05-06 NOTE — Progress Notes (Signed)
He presents today after having not seen him in the past couple of years with a chief concern of a painful fourth toe left foot and a painful fifth toe left foot.  He states that he is doing pretty well overall him with his general health however that fourth toe is exquisitely tender.  He still does get into her walks from [facility] Day to Labor Day.  Objective: Vital signs are stable alert oriented x3.  Pulses are palpable.  Neurologic sensorium is intact toes #2 and 3 are rectus with fusions previously.  He is also had a second metatarsal osteotomy with screw fixation.  He also has a swan-neck hammertoe deformity of the fourth toe left foot and adductovarus rotated hammertoe deformity left foot.  Radiographs taken today demonstrate screws to toes 2 and 3 and screws to the second metatarsal.  Swan-neck and hammertoe deformity is present as well as adductovarus rotated hammertoe deformity fifth left.  Assessment: Hammertoe deformity fourth and fifth left  Plan: Discussed etiology pathology conservative surgical therapies at this point we are going to go ahead and follow-up with him after Labor Day for immediate surgical intervention regarding the fourth and fifth toe fusion of the fourth toe derotational arthroplasty fifth toe

## 2020-08-25 DIAGNOSIS — E7800 Pure hypercholesterolemia, unspecified: Secondary | ICD-10-CM | POA: Diagnosis not present

## 2020-08-25 DIAGNOSIS — E669 Obesity, unspecified: Secondary | ICD-10-CM | POA: Diagnosis not present

## 2020-08-25 DIAGNOSIS — I10 Essential (primary) hypertension: Secondary | ICD-10-CM | POA: Diagnosis not present

## 2020-08-25 DIAGNOSIS — G4733 Obstructive sleep apnea (adult) (pediatric): Secondary | ICD-10-CM | POA: Diagnosis not present

## 2020-08-26 DIAGNOSIS — J3081 Allergic rhinitis due to animal (cat) (dog) hair and dander: Secondary | ICD-10-CM | POA: Diagnosis not present

## 2020-08-26 DIAGNOSIS — J3089 Other allergic rhinitis: Secondary | ICD-10-CM | POA: Diagnosis not present

## 2020-08-26 DIAGNOSIS — J301 Allergic rhinitis due to pollen: Secondary | ICD-10-CM | POA: Diagnosis not present

## 2020-08-27 IMAGING — CT CT CHEST W/O CM
2 of 3 series · 15 of 36 positions shown, 18 images · non-contrast
Comparison: 05/06/2017 and 10/21/2016

CLINICAL DATA: Follow-up indeterminate pulmonary nodules.

EXAM:
CT CHEST WITHOUT CONTRAST
TECHNIQUE: Multidetector CT imaging of the chest was performed following the
standard protocol without IV contrast.

[Series 2: thorax · axial · 0.78mm/px · z∈[-347,-53]mm · 12 of 173 slices shown, 15 images]
[im 13/173  mediastinal]
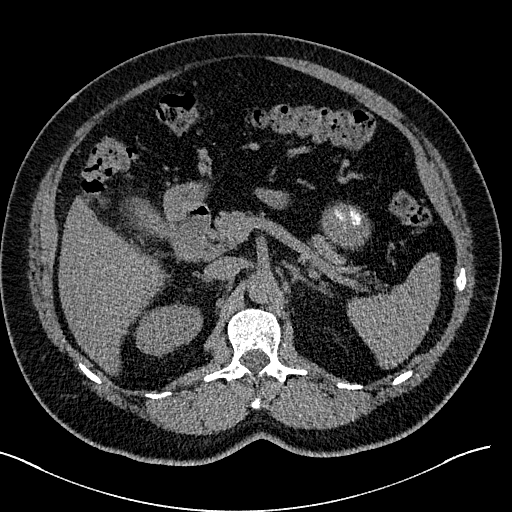
[im 13/173  lung]
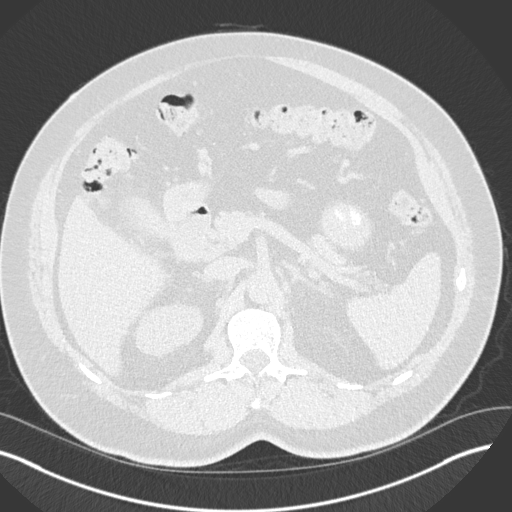
[im 26/173  lung]
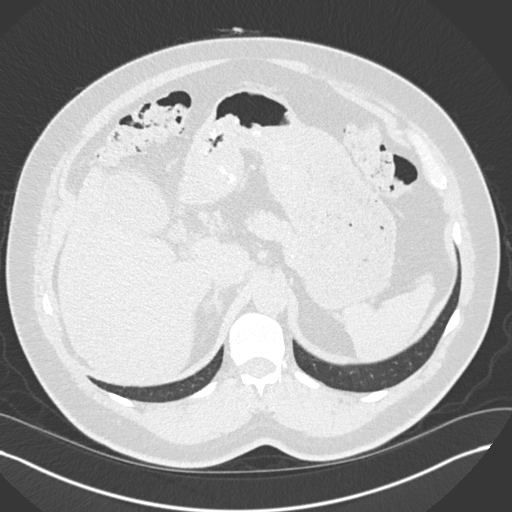
[im 39/173  lung]
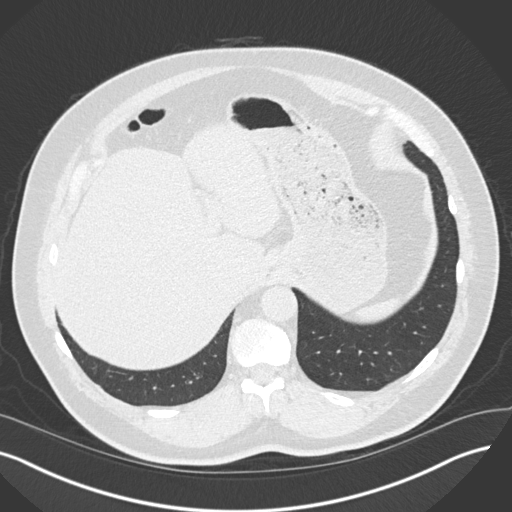
[im 51/173  lung]
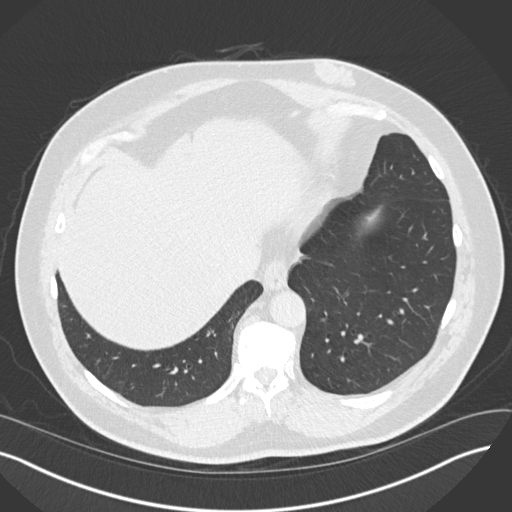
[im 64/173  mediastinal]
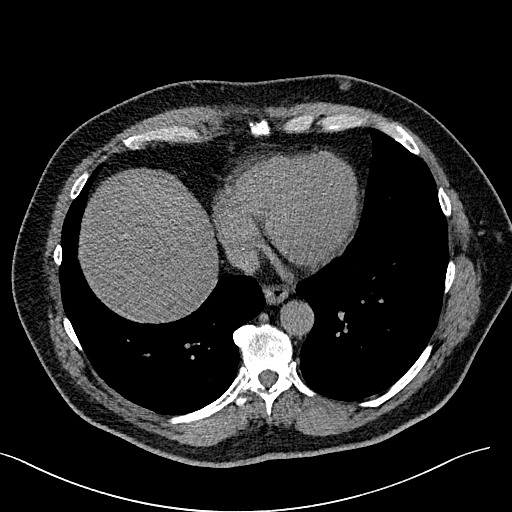
[im 64/173  lung]
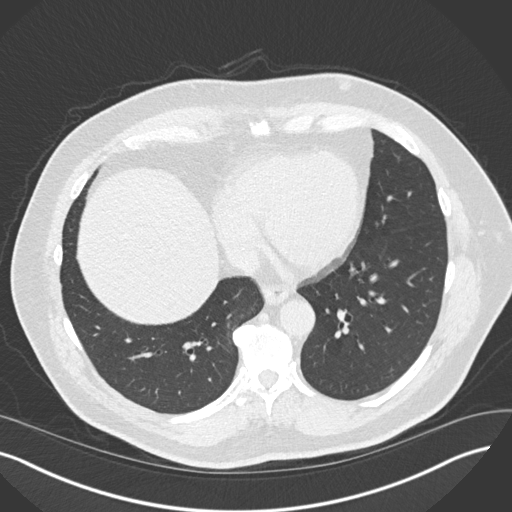
[im 77/173  lung]
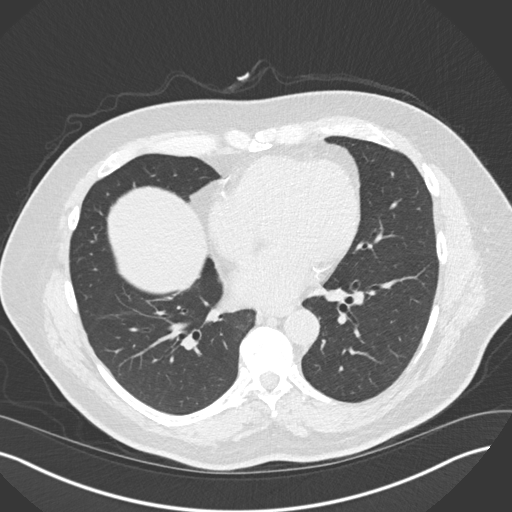
[im 96/173  lung]
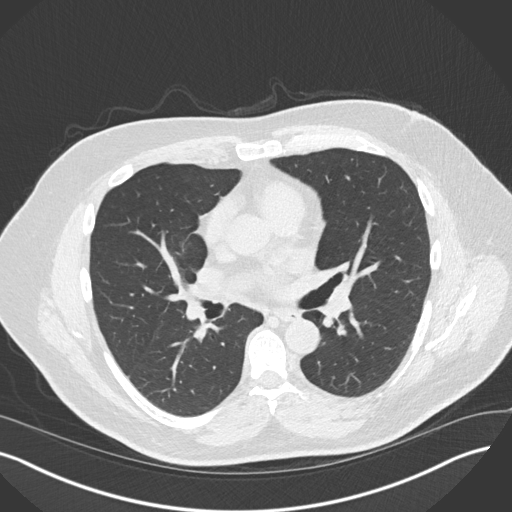
[im 109/173  lung]
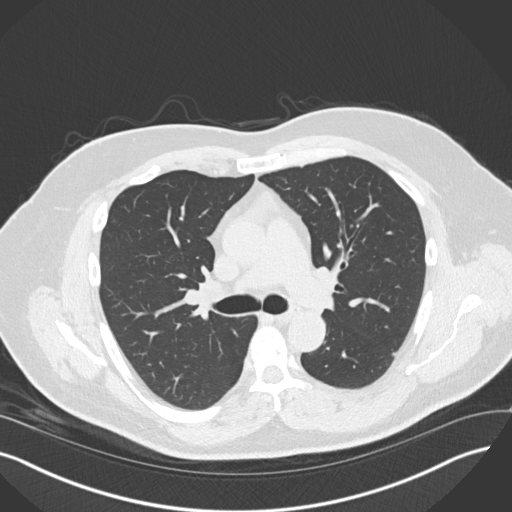
[im 122/173  mediastinal]
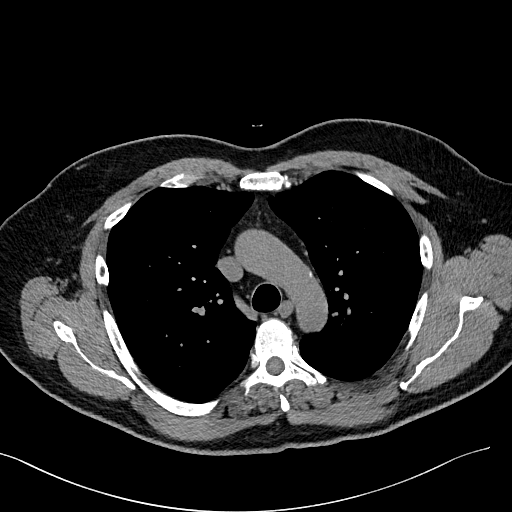
[im 122/173  lung]
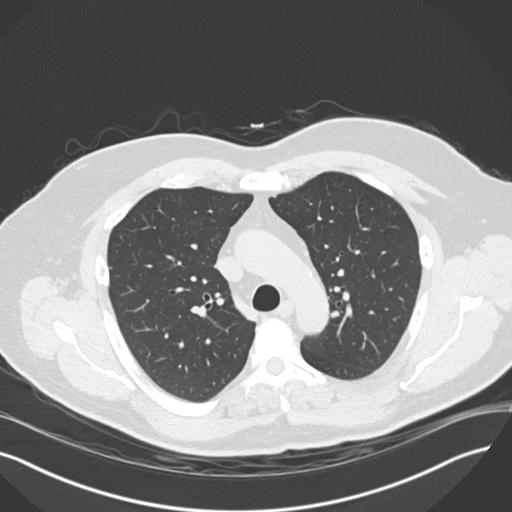
[im 134/173  lung]
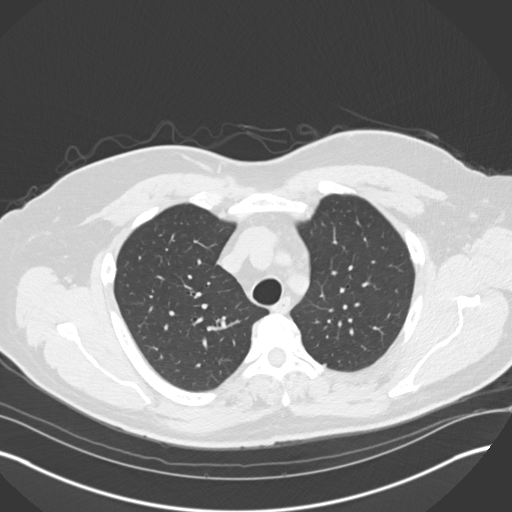
[im 147/173  lung]
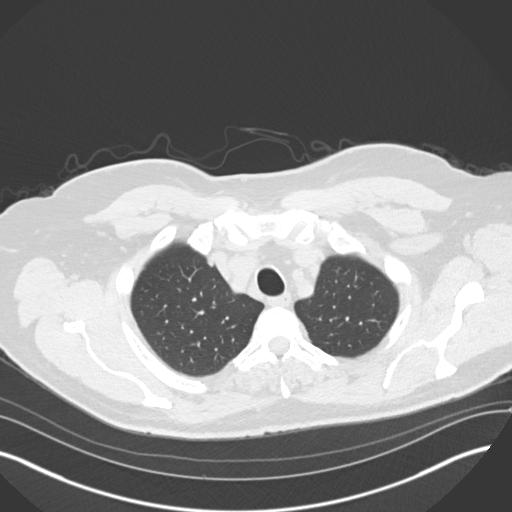
[im 160/173  lung]
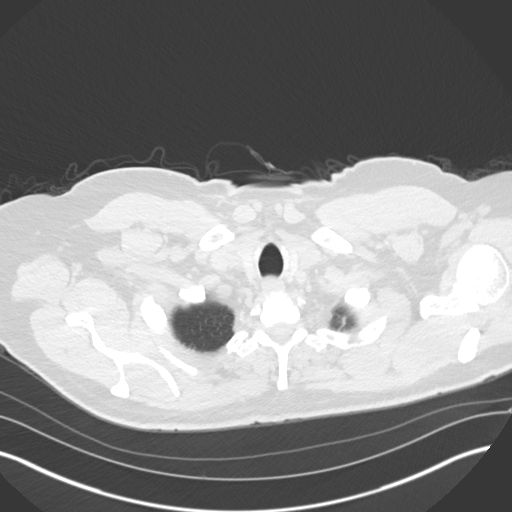

[Series 5: coronal · coronal · 0.67mm/px · 3 of 128 slices shown]
[im 26/128  lung]
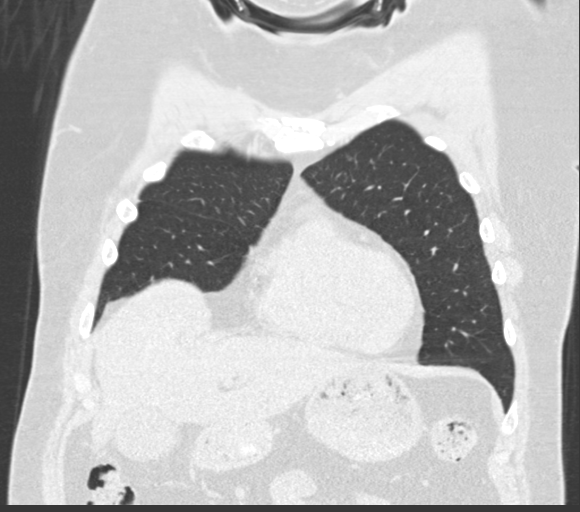
[im 51/128  lung]
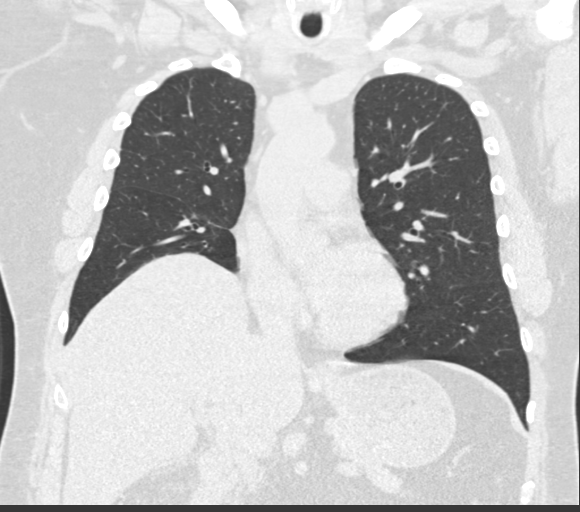
[im 77/128  lung]
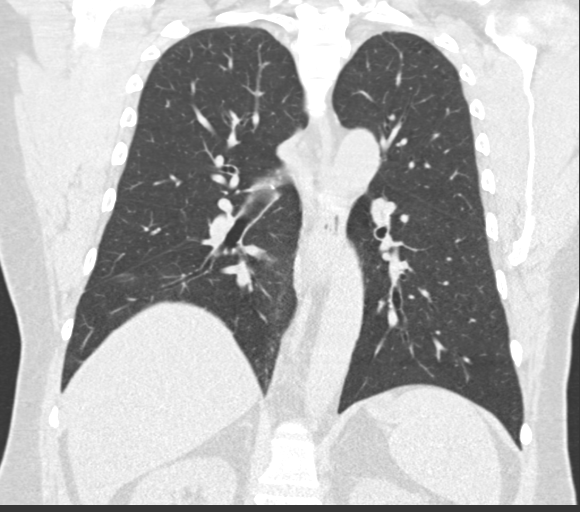

[15 of 36 positions shown; findings below may reference images not displayed]

FINDINGS: Cardiovascular: No acute findings. Aortic and coronary artery
atherosclerosis.

Mediastinum/Nodes: No masses or pathologically enlarged lymph nodes
identified on this unenhanced exam.

Lungs/Pleura: Scattered tiny less than 5 mm pulmonary nodules are
again seen bilaterally and are stable since prior exams, consistent
with benign etiology. No new or enlarging pulmonary nodules or
masses are identified. No evidence of pulmonary infiltrate or
pleural effusion.

Upper Abdomen:  Unremarkable.

Musculoskeletal: A left lower anterior chest wall lesion is again
seen in the subcutaneous tissues which measures 2.0 x 4.0 cm on
image 118/2. This is stable since previous study.
IMPRESSION: No acute findings.

Stable sub-cm bilateral pulmonary nodules, consistent with benign
etiology.

Stable left lower anterior chest wall subcutaneous lesion,
consistent with benign etiology.

Aortic Atherosclerosis ([id number]). Coronary artery
atherosclerosis.

## 2020-09-05 DIAGNOSIS — J3089 Other allergic rhinitis: Secondary | ICD-10-CM | POA: Diagnosis not present

## 2020-09-05 DIAGNOSIS — J3081 Allergic rhinitis due to animal (cat) (dog) hair and dander: Secondary | ICD-10-CM | POA: Diagnosis not present

## 2020-09-05 DIAGNOSIS — J301 Allergic rhinitis due to pollen: Secondary | ICD-10-CM | POA: Diagnosis not present

## 2020-09-11 DIAGNOSIS — J3081 Allergic rhinitis due to animal (cat) (dog) hair and dander: Secondary | ICD-10-CM | POA: Diagnosis not present

## 2020-09-11 DIAGNOSIS — J3089 Other allergic rhinitis: Secondary | ICD-10-CM | POA: Diagnosis not present

## 2020-09-11 DIAGNOSIS — J301 Allergic rhinitis due to pollen: Secondary | ICD-10-CM | POA: Diagnosis not present

## 2020-09-17 DIAGNOSIS — J3089 Other allergic rhinitis: Secondary | ICD-10-CM | POA: Diagnosis not present

## 2020-09-17 DIAGNOSIS — J3081 Allergic rhinitis due to animal (cat) (dog) hair and dander: Secondary | ICD-10-CM | POA: Diagnosis not present

## 2020-09-17 DIAGNOSIS — J301 Allergic rhinitis due to pollen: Secondary | ICD-10-CM | POA: Diagnosis not present

## 2020-09-23 DIAGNOSIS — J301 Allergic rhinitis due to pollen: Secondary | ICD-10-CM | POA: Diagnosis not present

## 2020-09-23 DIAGNOSIS — J3081 Allergic rhinitis due to animal (cat) (dog) hair and dander: Secondary | ICD-10-CM | POA: Diagnosis not present

## 2020-09-23 DIAGNOSIS — J3089 Other allergic rhinitis: Secondary | ICD-10-CM | POA: Diagnosis not present

## 2020-09-30 ENCOUNTER — Ambulatory Visit: Payer: Medicare PPO | Admitting: Podiatry

## 2020-09-30 ENCOUNTER — Encounter: Payer: Self-pay | Admitting: Podiatry

## 2020-09-30 ENCOUNTER — Other Ambulatory Visit: Payer: Self-pay

## 2020-09-30 DIAGNOSIS — M2042 Other hammer toe(s) (acquired), left foot: Secondary | ICD-10-CM | POA: Diagnosis not present

## 2020-09-30 DIAGNOSIS — S93602A Unspecified sprain of left foot, initial encounter: Secondary | ICD-10-CM

## 2020-09-30 NOTE — Progress Notes (Signed)
He presents today for follow-up of his hammertoe deformities fourth and fifth of his left foot.  On previous surgeries including second metatarsal osteotomy hammertoe repair #2 #3 of his left foot was very successful and he would like to have the other 2 toes taking care of.  States that this forceful and really hurts with pressure.  He has also been having pain to the dorsal aspect of the left foot around the fourth fifth tarsometatarsal joint and just distal to that area as he points.  Objective: I have reviewed his past medical history medications allergies surgery social history review of systems.  Currently he has pulses are strong and palpable his left foot he does have a swan-neck type hammertoe deformity of his fourth toe left foot confirmed by radiographs taken in April.  Those were reviewed today demonstrating no change in findings.  Also has an adductovarus rotated hammertoe deformity fifth left.  This is very flexible in nature.  He still has tenderness on palpation of the intermetatarsal ligament proximally at the fourth fifth tarsometatarsal joint.  Assessment: Hammertoe deformity fourth fifth toes left foot.  Sprain of the fourth fifth tarsometatarsal intermetatarsal ligament proximally.  Plan: Discussed etiology pathology conservative surgical therapies at this point I highly recommended surgical intervention consisting of a hammertoe repair fourth toe with pin or screw as well as a derotational arthroplasty fifth digit left foot.  At this point he consents to this.  We did discuss the pros and cons of the surgery and the possible side effects which may include but are not limited to postop pain bleeding swelling infection recurrence need for further surgery overcorrection under correction loss of digit loss of limb loss of life.  We discussed possibly injecting the area that is painful for him at the time of surgery at the fourth fifth tarsometatarsal joint.  Follow-up with him in the near  future.  He was seen by the scheduler today.

## 2020-10-01 ENCOUNTER — Telehealth: Payer: Self-pay | Admitting: Urology

## 2020-10-01 NOTE — Telephone Encounter (Signed)
DOS - 10/17/20  HAMMERTOE REPAIR 4,5 LEFT --- [id number]   [employer] EFFECTIVE DATE - 10/26/19   PER [employer] SITE FOR CPT CODE 29562 X'S 2 HAS BEEN APPROVED, AUTH # [id number].  TRACKING # [id number]

## 2020-10-02 DIAGNOSIS — J3089 Other allergic rhinitis: Secondary | ICD-10-CM | POA: Diagnosis not present

## 2020-10-02 DIAGNOSIS — J301 Allergic rhinitis due to pollen: Secondary | ICD-10-CM | POA: Diagnosis not present

## 2020-10-02 DIAGNOSIS — J3081 Allergic rhinitis due to animal (cat) (dog) hair and dander: Secondary | ICD-10-CM | POA: Diagnosis not present

## 2020-10-09 DIAGNOSIS — J301 Allergic rhinitis due to pollen: Secondary | ICD-10-CM | POA: Diagnosis not present

## 2020-10-09 DIAGNOSIS — I10 Essential (primary) hypertension: Secondary | ICD-10-CM | POA: Diagnosis not present

## 2020-10-09 DIAGNOSIS — J3089 Other allergic rhinitis: Secondary | ICD-10-CM | POA: Diagnosis not present

## 2020-10-09 DIAGNOSIS — J3081 Allergic rhinitis due to animal (cat) (dog) hair and dander: Secondary | ICD-10-CM | POA: Diagnosis not present

## 2020-10-14 DIAGNOSIS — J3089 Other allergic rhinitis: Secondary | ICD-10-CM | POA: Diagnosis not present

## 2020-10-14 DIAGNOSIS — J3081 Allergic rhinitis due to animal (cat) (dog) hair and dander: Secondary | ICD-10-CM | POA: Diagnosis not present

## 2020-10-14 DIAGNOSIS — J301 Allergic rhinitis due to pollen: Secondary | ICD-10-CM | POA: Diagnosis not present

## 2020-10-15 ENCOUNTER — Other Ambulatory Visit: Payer: Self-pay | Admitting: Podiatry

## 2020-10-15 MED ORDER — OXYCODONE-ACETAMINOPHEN 10-325 MG PO TABS: 1.0000 | ORAL_TABLET | Freq: Three times a day (TID) | ORAL | 0 refills | Status: AC | PRN

## 2020-10-15 MED ORDER — CLINDAMYCIN HCL 150 MG PO CAPS: 150.0000 mg | ORAL_CAPSULE | Freq: Three times a day (TID) | ORAL | 0 refills | Status: DC

## 2020-10-15 MED ORDER — ONDANSETRON HCL 4 MG PO TABS: 4.0000 mg | ORAL_TABLET | Freq: Three times a day (TID) | ORAL | 0 refills | Status: DC | PRN

## 2020-10-17 DIAGNOSIS — M2042 Other hammer toe(s) (acquired), left foot: Secondary | ICD-10-CM | POA: Diagnosis not present

## 2020-10-22 DIAGNOSIS — J3089 Other allergic rhinitis: Secondary | ICD-10-CM | POA: Diagnosis not present

## 2020-10-22 DIAGNOSIS — J301 Allergic rhinitis due to pollen: Secondary | ICD-10-CM | POA: Diagnosis not present

## 2020-10-22 DIAGNOSIS — J3081 Allergic rhinitis due to animal (cat) (dog) hair and dander: Secondary | ICD-10-CM | POA: Diagnosis not present

## 2020-10-22 NOTE — Progress Notes (Signed)
PATIENT: Tyler [name] DOB: [birth year]  REASON FOR VISIT: follow up HISTORY FROM: patient  HISTORY OF PRESENT ILLNESS: Today 10/23/20:  Tyler [name] is a 65 year old male with a history of obstructive sleep apnea on CPAP.  His download is below.  He reports that the CPAP is working well for him.  He recently had surgery on his left foot for hammertoe.  He is in a boot today.  He returns today for evaluation.    10/23/19: Tyler [name] is a 65 year old male with a history of obstructive sleep apnea on CPAP.  His download indicates that he uses machine nightly for compliance of 100%.  He uses machine greater than 4 hours each night.  On average he uses his machine 8 hours and 38 minutes.  His residual AHI is 2.6 on 5 to 12 cm of water.  His leak in the 95th percentile is 13.6 L/min.  He reports that the CPAP is working well for him.  He does not like to sleep without it.  HISTORY 10/23/18:   Tyler [name] is a 65 year old male with a history of obstructive sleep apnea on CPAP.  He returns today for follow-up.  His download indicates that he uses machine nightly for compliance of 100%.  Every night he uses machine greater than 4 hours.  On average he uses his machine 8 hours and 25 minutes.  His residual AHI is 3.2 on 5 to 12 cm of water with EPR of 1.  His leak in the 95th percentile is 12.7 L/min.  He reports that the CPAP is working well for him.  He definitely can tell the benefit.  He returns today for an evaluation.  REVIEW OF SYSTEMS: Out of a complete 14 system review of symptoms, the patient complains only of the following symptoms, and all other reviewed systems are negative.   ESS 7  ALLERGIES: Allergies  Allergen Reactions   Lisinopril     cough   Augmentin [Amoxicillin-Pot Clavulanate] Rash    Has patient had a PCN reaction causing immediate rash, facial/tongue/throat swelling, SOB or lightheadedness with hypotension: Yes Has patient had a PCN reaction causing  severe rash involving mucus membranes or skin necrosis: No Has patient had a PCN reaction that required hospitalization: No Has patient had a PCN reaction occurring within the last 10 years: Yes If all of the above answers are "NO", then may proceed with Cephalosporin use.    Doxycycline Rash    HOME MEDICATIONS: Outpatient Medications Prior to Visit  Medication Sig Dispense Refill   ALBUTEROL IN Inhale into the lungs.     amLODipine (NORVASC) 10 MG tablet Take 10 mg by mouth daily.     Calcium-Magnesium-Zinc (CAL-MAG-ZINC PO) Take 2 tablets by mouth daily.     Cyanocobalamin (B-12 PO) Take by mouth.     ferrous sulfate 325 (65 FE) MG tablet Take 325 mg by mouth daily.      fluticasone (FLOVENT DISKUS) 50 MCG/BLIST diskus inhaler Inhale 1 puff into the lungs 2 (two) times daily.     hydrochlorothiazide (HYDRODIURIL) 12.5 MG tablet Take 12.5 mg by mouth daily.     levocetirizine (XYZAL) 5 MG tablet Take 5 mg by mouth every evening.     montelukast (SINGULAIR) 10 MG tablet TK 1 T PO D     sildenafil (VIAGRA) 50 MG tablet Take 50 mg by mouth daily as needed for erectile dysfunction.     Tamsulosin HCl (FLOMAX) 0.4 MG CAPS Take 0.8 mg by  mouth daily.      clindamycin (CLEOCIN) 150 MG capsule Take 1 capsule (150 mg total) by mouth 3 (three) times daily. 30 capsule 0   ondansetron (ZOFRAN) 4 MG tablet Take 1 tablet (4 mg total) by mouth every 8 (eight) hours as needed. (Patient not taking: Reported on 10/23/2020) 20 tablet 0   potassium chloride SA (K-DUR,KLOR-CON) 20 MEQ tablet Take 20 mEq by mouth daily.     No facility-administered medications prior to visit.    PAST MEDICAL HISTORY: Past Medical History:  Diagnosis Date   Arthritis    Asthma    Complication of anesthesia 08/17/2016   pt had urinary retention following last sinus surgery that required him to go to the ED to be in and out cathedx1   History of hiatal hernia    Hypertension    Sleep apnea     PAST SURGICAL  HISTORY: Past Surgical History:  Procedure Laterality Date   NASAL SINUS SURGERY  2002   ROTATOR CUFF REPAIR     SEPTOPLASTY WITH ETHMOIDECTOMY, AND MAXILLARY ANTROSTOMY N/A 08/19/2016   Procedure: SEPTOPLASTY WITH ETHMOIDECTOMY, AND MAXILLARY ANTROSTOMY;  Surgeon: [name], MD;  Location: [facility];  Service: ENT;  Laterality: N/A;   TURBINATE REDUCTION N/A 08/19/2016   Procedure: [place];  Surgeon: [name], MD;  Location: [facility];  Service: ENT;  Laterality: N/A;   WISDOM TOOTH EXTRACTION      FAMILY HISTORY: Family History  Problem Relation Age of Onset   Aneurysm Father    Heart failure Mother    Colon cancer Neg Hx    Esophageal cancer Neg Hx    Rectal cancer Neg Hx    Stomach cancer Neg Hx     SOCIAL HISTORY: Social History   Socioeconomic History   Marital status: Married    Spouse name: Not on file   Number of children: 2   Years of education: Not on file   Highest education level: Not on file  Occupational History   Occupation: Teacher  Tobacco Use   Smoking status: Some Days    Types: Cigars   Smokeless tobacco: Never   Tobacco comments:    occasional cigar  Vaping Use   Vaping Use: Never used  Substance and Sexual Activity   Alcohol use: Yes    Alcohol/week: 5.0 standard drinks    Types: 5 Glasses of wine per week   Drug use: No   Sexual activity: Not on file  Other Topics Concern   Not on file  Social History Narrative   Not on file   Social Determinants of Health   Financial Resource Strain: Not on file  Food Insecurity: Not on file  Transportation Needs: Not on file  Physical Activity: Not on file  Stress: Not on file  Social Connections: Not on file  Intimate Partner Violence: Not on file      PHYSICAL EXAM  Vitals:   10/23/20 0907  BP: 132/87  Pulse: 72  Weight: 230 lb 3.2 oz (104.4 kg)  Height: 5\' 10"  (1.778 m)   Body mass index is 33.03 kg/m.  Generalized: Well developed, in no acute distress   Chest: Lungs clear to auscultation bilaterally  Neurological examination  Mentation: Alert oriented to time, place, history taking. Follows all commands speech and language fluent Cranial nerve II-XII: Extraocular movements were full, visual field were full on confrontational test Head turning and shoulder shrug  were normal and symmetric. Motor: The motor testing reveals 5 over  5 strength of all 4 extremities. Good symmetric motor tone is noted throughout.  Sensory: Sensory testing is intact to soft touch on all 4 extremities. No evidence of extinction is noted.  Gait and station: Gait is normal.    DIAGNOSTIC DATA (LABS, IMAGING, TESTING) - I reviewed patient records, labs, notes, testing and imaging myself where available.  Lab Results  Component Value Date   WBC 5.1 08/17/2016   HGB 14.3 08/17/2016   HCT 41.5 08/17/2016   MCV 80.9 08/17/2016   PLT 204 08/17/2016      Component Value Date/Time   NA 142 08/17/2016 0809   K 3.3 (L) 08/17/2016 0809   CL 106 08/17/2016 0809   CO2 28 08/17/2016 0809   GLUCOSE 103 (H) 08/17/2016 0809   BUN 12 08/17/2016 0809   CREATININE 0.81 08/17/2016 0809   CALCIUM 8.8 (L) 08/17/2016 0809   GFRNONAA >60 08/17/2016 0809   GFRAA >60 08/17/2016 0809      ASSESSMENT AND PLAN 65 y.o. year old male  has a past medical history of Arthritis, Asthma, Complication of anesthesia (08/17/2016), History of hiatal hernia, Hypertension, and Sleep apnea. here with:  OSA on CPAP  - CPAP compliance excellent - Good treatment of AHI  - Encourage patient to use CPAP nightly and > 4 hours each night - F/U in 1 year or sooner if needed    Ward Givens, MSN, NP-C 10/23/2020, 9:14 AM [facility] [address] [place], [place] [address] [phone]

## 2020-10-23 ENCOUNTER — Other Ambulatory Visit: Payer: Self-pay

## 2020-10-23 ENCOUNTER — Encounter: Payer: Self-pay | Admitting: Adult Health

## 2020-10-23 ENCOUNTER — Encounter: Payer: Self-pay | Admitting: Podiatry

## 2020-10-23 ENCOUNTER — Ambulatory Visit: Payer: Medicare PPO | Admitting: Adult Health

## 2020-10-23 ENCOUNTER — Ambulatory Visit (INDEPENDENT_AMBULATORY_CARE_PROVIDER_SITE_OTHER): Payer: Medicare PPO | Admitting: Podiatry

## 2020-10-23 ENCOUNTER — Ambulatory Visit (INDEPENDENT_AMBULATORY_CARE_PROVIDER_SITE_OTHER): Payer: Medicare PPO

## 2020-10-23 VITALS — BP 132/87 | HR 72 | Ht 70.0 in | Wt 230.2 lb

## 2020-10-23 DIAGNOSIS — G4733 Obstructive sleep apnea (adult) (pediatric): Secondary | ICD-10-CM

## 2020-10-23 DIAGNOSIS — M2042 Other hammer toe(s) (acquired), left foot: Secondary | ICD-10-CM | POA: Diagnosis not present

## 2020-10-23 DIAGNOSIS — Z9889 Other specified postprocedural states: Secondary | ICD-10-CM

## 2020-10-23 DIAGNOSIS — Z9989 Dependence on other enabling machines and devices: Secondary | ICD-10-CM | POA: Diagnosis not present

## 2020-10-23 NOTE — Patient Instructions (Signed)
Continue using CPAP nightly and greater than 4 hours each night °If your symptoms worsen or you develop new symptoms please let us know.  ° °

## 2020-10-24 NOTE — Progress Notes (Signed)
He presents today for his first postop visit status post hammertoe repair #4 #5 of the left foot.  He denies fever chills nausea vomiting muscle aches pains calf pain back pain chest pain shortness of breath.  Objective: Dressed her dressing intact was removed demonstrates no erythema mild edema no cellulitis drainage or odor sutures are intact to toes 4 and 5 of the left foot.  Radiographs taken today demonstrate screw fourth toe left foot intact and with good compression..  Assessment: Well-healing surgical foot first postop visit.  Plan: Follow-up with him in 1 week for possible suture removal.  He has a Darco shoe at home which she will bring with him next visit.  We will demonstrate to him how to wrap the toes with Coban keep the edema down.  After that I will follow-up with him in 2 weeks

## 2020-10-30 ENCOUNTER — Ambulatory Visit (INDEPENDENT_AMBULATORY_CARE_PROVIDER_SITE_OTHER): Payer: Medicare PPO | Admitting: Podiatry

## 2020-10-30 ENCOUNTER — Other Ambulatory Visit: Payer: Self-pay

## 2020-10-30 DIAGNOSIS — J3081 Allergic rhinitis due to animal (cat) (dog) hair and dander: Secondary | ICD-10-CM | POA: Diagnosis not present

## 2020-10-30 DIAGNOSIS — J3089 Other allergic rhinitis: Secondary | ICD-10-CM | POA: Diagnosis not present

## 2020-10-30 DIAGNOSIS — M2042 Other hammer toe(s) (acquired), left foot: Secondary | ICD-10-CM

## 2020-10-30 DIAGNOSIS — J301 Allergic rhinitis due to pollen: Secondary | ICD-10-CM | POA: Diagnosis not present

## 2020-11-01 DIAGNOSIS — Z23 Encounter for immunization: Secondary | ICD-10-CM | POA: Diagnosis not present

## 2020-11-03 ENCOUNTER — Encounter: Payer: Self-pay | Admitting: Podiatry

## 2020-11-03 NOTE — Progress Notes (Signed)
  Subjective:  Patient ID: Tyler [name], male    DOB: [birth year],  MRN: [id number]  Chief Complaint  Patient presents with   Routine Post Op       POV #2 DOS 10/17/2020 HAMMERTOE REPAIR 4/5 LT/DR HYATT PT     65 y.o. male returns for post-op check.  Doing well  Review of Systems: Negative except as noted in the HPI. Denies N/V/F/Ch.   Objective:  There were no vitals filed for this visit. There is no height or weight on file to calculate BMI. Constitutional Well developed. Well nourished.  Vascular Foot warm and well perfused. Capillary refill normal to all digits.   Neurologic Normal speech. Oriented to person, place, and time. Epicritic sensation to light touch grossly present bilaterally.  Dermatologic Skin healing well without signs of infection. Skin edges well coapted without signs of infection.  Orthopedic: He has minimal tenderness to palpation noted about the surgical site.    Assessment:   1. Hammer toe of left foot    Plan:  Patient was evaluated and treated and all questions answered.  S/p foot surgery left hammertoe -Sutures removed today uneventfully. -He will follow-up in 2 weeks Dr. Milinda Pointer  Return in about 2 weeks (around 11/13/2020) for f/u with Dr Milinda Pointer.

## 2020-11-05 DIAGNOSIS — J301 Allergic rhinitis due to pollen: Secondary | ICD-10-CM | POA: Diagnosis not present

## 2020-11-05 DIAGNOSIS — J3081 Allergic rhinitis due to animal (cat) (dog) hair and dander: Secondary | ICD-10-CM | POA: Diagnosis not present

## 2020-11-05 DIAGNOSIS — J3089 Other allergic rhinitis: Secondary | ICD-10-CM | POA: Diagnosis not present

## 2020-11-10 DIAGNOSIS — J3081 Allergic rhinitis due to animal (cat) (dog) hair and dander: Secondary | ICD-10-CM | POA: Diagnosis not present

## 2020-11-10 DIAGNOSIS — J3089 Other allergic rhinitis: Secondary | ICD-10-CM | POA: Diagnosis not present

## 2020-11-10 DIAGNOSIS — J301 Allergic rhinitis due to pollen: Secondary | ICD-10-CM | POA: Diagnosis not present

## 2020-11-13 ENCOUNTER — Encounter: Payer: Self-pay | Admitting: Podiatry

## 2020-11-13 ENCOUNTER — Other Ambulatory Visit: Payer: Self-pay

## 2020-11-13 ENCOUNTER — Ambulatory Visit (INDEPENDENT_AMBULATORY_CARE_PROVIDER_SITE_OTHER): Payer: Medicare PPO | Admitting: Podiatry

## 2020-11-13 ENCOUNTER — Ambulatory Visit (INDEPENDENT_AMBULATORY_CARE_PROVIDER_SITE_OTHER): Payer: Medicare PPO

## 2020-11-13 DIAGNOSIS — M2042 Other hammer toe(s) (acquired), left foot: Secondary | ICD-10-CM | POA: Diagnosis not present

## 2020-11-13 DIAGNOSIS — I10 Essential (primary) hypertension: Secondary | ICD-10-CM | POA: Diagnosis not present

## 2020-11-13 DIAGNOSIS — Z9889 Other specified postprocedural states: Secondary | ICD-10-CM

## 2020-11-13 NOTE — Progress Notes (Signed)
He presents today nearly 1 month out date of surgery 10/17/2020 status post hammertoe repair #4 and 5 left foot fourth with screw.  He states that he gets sharp shooting pain at times but otherwise seems to be doing really well.  Objective: Vital signs are stable he is alert oriented x3 toe is rectus and in good position radiographs taken today demonstrate well-healing surgical foot it appears that he is going to have a nice arthrodesis at the PIPJ.  I see no signs of infection in the area.  Assessment: Well-healing surgical toes.  Plan: Follow-up with me in 1 month if necessary.

## 2020-11-18 DIAGNOSIS — J3089 Other allergic rhinitis: Secondary | ICD-10-CM | POA: Diagnosis not present

## 2020-11-18 DIAGNOSIS — R059 Cough, unspecified: Secondary | ICD-10-CM | POA: Diagnosis not present

## 2020-11-18 DIAGNOSIS — J301 Allergic rhinitis due to pollen: Secondary | ICD-10-CM | POA: Diagnosis not present

## 2020-11-24 DIAGNOSIS — J3081 Allergic rhinitis due to animal (cat) (dog) hair and dander: Secondary | ICD-10-CM | POA: Diagnosis not present

## 2020-11-24 DIAGNOSIS — J301 Allergic rhinitis due to pollen: Secondary | ICD-10-CM | POA: Diagnosis not present

## 2020-11-24 DIAGNOSIS — J3089 Other allergic rhinitis: Secondary | ICD-10-CM | POA: Diagnosis not present

## 2020-11-27 ENCOUNTER — Encounter: Payer: Medicare PPO | Admitting: Podiatry

## 2020-12-04 DIAGNOSIS — J3081 Allergic rhinitis due to animal (cat) (dog) hair and dander: Secondary | ICD-10-CM | POA: Diagnosis not present

## 2020-12-04 DIAGNOSIS — J3089 Other allergic rhinitis: Secondary | ICD-10-CM | POA: Diagnosis not present

## 2020-12-04 DIAGNOSIS — J301 Allergic rhinitis due to pollen: Secondary | ICD-10-CM | POA: Diagnosis not present

## 2020-12-10 DIAGNOSIS — J3089 Other allergic rhinitis: Secondary | ICD-10-CM | POA: Diagnosis not present

## 2020-12-10 DIAGNOSIS — J3081 Allergic rhinitis due to animal (cat) (dog) hair and dander: Secondary | ICD-10-CM | POA: Diagnosis not present

## 2020-12-10 DIAGNOSIS — J301 Allergic rhinitis due to pollen: Secondary | ICD-10-CM | POA: Diagnosis not present

## 2020-12-11 ENCOUNTER — Other Ambulatory Visit: Payer: Self-pay

## 2020-12-11 ENCOUNTER — Ambulatory Visit: Payer: Medicare PPO | Admitting: Podiatry

## 2020-12-11 ENCOUNTER — Ambulatory Visit (INDEPENDENT_AMBULATORY_CARE_PROVIDER_SITE_OTHER): Payer: Medicare PPO

## 2020-12-11 ENCOUNTER — Encounter: Payer: Self-pay | Admitting: Podiatry

## 2020-12-11 DIAGNOSIS — M2042 Other hammer toe(s) (acquired), left foot: Secondary | ICD-10-CM

## 2020-12-11 DIAGNOSIS — Z9889 Other specified postprocedural states: Secondary | ICD-10-CM

## 2020-12-14 NOTE — Progress Notes (Signed)
He presents today date of surgery 10/17/2020 hammertoe repair fourth fifth left foot states that is really doing good I sometimes get a little twinge of pain or something of the fourth toe.  Objective: Vital signs are stable he is alert and oriented x3.  Pulses are palpable.  All of his toes are laying rectus and in good position.  Radiographs taken today demonstrate a healing but not yet healed arthrodesis site.  Screw compression appears to be intact and in good position.  No fracturing of the internal fixation.  Assessment: Well-healing surgical foot.  Plan: Follow-up with me as needed

## 2020-12-15 DIAGNOSIS — J3081 Allergic rhinitis due to animal (cat) (dog) hair and dander: Secondary | ICD-10-CM | POA: Diagnosis not present

## 2020-12-15 DIAGNOSIS — J3089 Other allergic rhinitis: Secondary | ICD-10-CM | POA: Diagnosis not present

## 2020-12-15 DIAGNOSIS — J301 Allergic rhinitis due to pollen: Secondary | ICD-10-CM | POA: Diagnosis not present

## 2020-12-17 DIAGNOSIS — H52203 Unspecified astigmatism, bilateral: Secondary | ICD-10-CM | POA: Diagnosis not present

## 2020-12-17 DIAGNOSIS — H2513 Age-related nuclear cataract, bilateral: Secondary | ICD-10-CM | POA: Diagnosis not present

## 2020-12-23 DIAGNOSIS — J301 Allergic rhinitis due to pollen: Secondary | ICD-10-CM | POA: Diagnosis not present

## 2020-12-23 DIAGNOSIS — J3081 Allergic rhinitis due to animal (cat) (dog) hair and dander: Secondary | ICD-10-CM | POA: Diagnosis not present

## 2020-12-23 DIAGNOSIS — J3089 Other allergic rhinitis: Secondary | ICD-10-CM | POA: Diagnosis not present

## 2021-01-01 DIAGNOSIS — J301 Allergic rhinitis due to pollen: Secondary | ICD-10-CM | POA: Diagnosis not present

## 2021-01-01 DIAGNOSIS — J3089 Other allergic rhinitis: Secondary | ICD-10-CM | POA: Diagnosis not present

## 2021-01-01 DIAGNOSIS — J3081 Allergic rhinitis due to animal (cat) (dog) hair and dander: Secondary | ICD-10-CM | POA: Diagnosis not present

## 2021-01-05 DIAGNOSIS — J3081 Allergic rhinitis due to animal (cat) (dog) hair and dander: Secondary | ICD-10-CM | POA: Diagnosis not present

## 2021-01-05 DIAGNOSIS — J301 Allergic rhinitis due to pollen: Secondary | ICD-10-CM | POA: Diagnosis not present

## 2021-01-05 DIAGNOSIS — J3089 Other allergic rhinitis: Secondary | ICD-10-CM | POA: Diagnosis not present

## 2021-01-07 DIAGNOSIS — J3089 Other allergic rhinitis: Secondary | ICD-10-CM | POA: Diagnosis not present

## 2021-01-07 DIAGNOSIS — J3081 Allergic rhinitis due to animal (cat) (dog) hair and dander: Secondary | ICD-10-CM | POA: Diagnosis not present

## 2021-01-07 DIAGNOSIS — J301 Allergic rhinitis due to pollen: Secondary | ICD-10-CM | POA: Diagnosis not present

## 2021-01-13 DIAGNOSIS — J301 Allergic rhinitis due to pollen: Secondary | ICD-10-CM | POA: Diagnosis not present

## 2021-01-13 DIAGNOSIS — J3081 Allergic rhinitis due to animal (cat) (dog) hair and dander: Secondary | ICD-10-CM | POA: Diagnosis not present

## 2021-01-13 DIAGNOSIS — J3089 Other allergic rhinitis: Secondary | ICD-10-CM | POA: Diagnosis not present

## 2021-01-22 DIAGNOSIS — J301 Allergic rhinitis due to pollen: Secondary | ICD-10-CM | POA: Diagnosis not present

## 2021-01-22 DIAGNOSIS — J3089 Other allergic rhinitis: Secondary | ICD-10-CM | POA: Diagnosis not present

## 2021-01-22 DIAGNOSIS — J3081 Allergic rhinitis due to animal (cat) (dog) hair and dander: Secondary | ICD-10-CM | POA: Diagnosis not present

## 2021-01-27 DIAGNOSIS — J3089 Other allergic rhinitis: Secondary | ICD-10-CM | POA: Diagnosis not present

## 2021-01-27 DIAGNOSIS — J301 Allergic rhinitis due to pollen: Secondary | ICD-10-CM | POA: Diagnosis not present

## 2021-01-27 DIAGNOSIS — J3081 Allergic rhinitis due to animal (cat) (dog) hair and dander: Secondary | ICD-10-CM | POA: Diagnosis not present

## 2021-02-03 DIAGNOSIS — J3089 Other allergic rhinitis: Secondary | ICD-10-CM | POA: Diagnosis not present

## 2021-02-03 DIAGNOSIS — J3081 Allergic rhinitis due to animal (cat) (dog) hair and dander: Secondary | ICD-10-CM | POA: Diagnosis not present

## 2021-02-03 DIAGNOSIS — J301 Allergic rhinitis due to pollen: Secondary | ICD-10-CM | POA: Diagnosis not present

## 2021-02-10 DIAGNOSIS — J3089 Other allergic rhinitis: Secondary | ICD-10-CM | POA: Diagnosis not present

## 2021-02-10 DIAGNOSIS — J301 Allergic rhinitis due to pollen: Secondary | ICD-10-CM | POA: Diagnosis not present

## 2021-02-10 DIAGNOSIS — J3081 Allergic rhinitis due to animal (cat) (dog) hair and dander: Secondary | ICD-10-CM | POA: Diagnosis not present

## 2021-02-17 DIAGNOSIS — J3089 Other allergic rhinitis: Secondary | ICD-10-CM | POA: Diagnosis not present

## 2021-02-17 DIAGNOSIS — J3081 Allergic rhinitis due to animal (cat) (dog) hair and dander: Secondary | ICD-10-CM | POA: Diagnosis not present

## 2021-02-17 DIAGNOSIS — J301 Allergic rhinitis due to pollen: Secondary | ICD-10-CM | POA: Diagnosis not present

## 2021-02-24 DIAGNOSIS — J3089 Other allergic rhinitis: Secondary | ICD-10-CM | POA: Diagnosis not present

## 2021-02-24 DIAGNOSIS — J3081 Allergic rhinitis due to animal (cat) (dog) hair and dander: Secondary | ICD-10-CM | POA: Diagnosis not present

## 2021-02-24 DIAGNOSIS — J301 Allergic rhinitis due to pollen: Secondary | ICD-10-CM | POA: Diagnosis not present

## 2021-03-06 DIAGNOSIS — J3081 Allergic rhinitis due to animal (cat) (dog) hair and dander: Secondary | ICD-10-CM | POA: Diagnosis not present

## 2021-03-06 DIAGNOSIS — J3089 Other allergic rhinitis: Secondary | ICD-10-CM | POA: Diagnosis not present

## 2021-03-06 DIAGNOSIS — J301 Allergic rhinitis due to pollen: Secondary | ICD-10-CM | POA: Diagnosis not present

## 2021-03-10 DIAGNOSIS — J3089 Other allergic rhinitis: Secondary | ICD-10-CM | POA: Diagnosis not present

## 2021-03-10 DIAGNOSIS — J3081 Allergic rhinitis due to animal (cat) (dog) hair and dander: Secondary | ICD-10-CM | POA: Diagnosis not present

## 2021-03-10 DIAGNOSIS — J301 Allergic rhinitis due to pollen: Secondary | ICD-10-CM | POA: Diagnosis not present

## 2021-03-17 DIAGNOSIS — J3081 Allergic rhinitis due to animal (cat) (dog) hair and dander: Secondary | ICD-10-CM | POA: Diagnosis not present

## 2021-03-17 DIAGNOSIS — J301 Allergic rhinitis due to pollen: Secondary | ICD-10-CM | POA: Diagnosis not present

## 2021-03-17 DIAGNOSIS — J3089 Other allergic rhinitis: Secondary | ICD-10-CM | POA: Diagnosis not present

## 2021-03-30 DIAGNOSIS — J3089 Other allergic rhinitis: Secondary | ICD-10-CM | POA: Diagnosis not present

## 2021-03-30 DIAGNOSIS — J3081 Allergic rhinitis due to animal (cat) (dog) hair and dander: Secondary | ICD-10-CM | POA: Diagnosis not present

## 2021-03-30 DIAGNOSIS — J301 Allergic rhinitis due to pollen: Secondary | ICD-10-CM | POA: Diagnosis not present

## 2021-04-10 DIAGNOSIS — J3089 Other allergic rhinitis: Secondary | ICD-10-CM | POA: Diagnosis not present

## 2021-04-10 DIAGNOSIS — J3081 Allergic rhinitis due to animal (cat) (dog) hair and dander: Secondary | ICD-10-CM | POA: Diagnosis not present

## 2021-04-10 DIAGNOSIS — J301 Allergic rhinitis due to pollen: Secondary | ICD-10-CM | POA: Diagnosis not present

## 2021-04-14 DIAGNOSIS — J301 Allergic rhinitis due to pollen: Secondary | ICD-10-CM | POA: Diagnosis not present

## 2021-04-14 DIAGNOSIS — J3081 Allergic rhinitis due to animal (cat) (dog) hair and dander: Secondary | ICD-10-CM | POA: Diagnosis not present

## 2021-04-14 DIAGNOSIS — J3089 Other allergic rhinitis: Secondary | ICD-10-CM | POA: Diagnosis not present

## 2021-04-20 DIAGNOSIS — J3081 Allergic rhinitis due to animal (cat) (dog) hair and dander: Secondary | ICD-10-CM | POA: Diagnosis not present

## 2021-04-20 DIAGNOSIS — J301 Allergic rhinitis due to pollen: Secondary | ICD-10-CM | POA: Diagnosis not present

## 2021-04-20 DIAGNOSIS — J3089 Other allergic rhinitis: Secondary | ICD-10-CM | POA: Diagnosis not present

## 2021-04-28 DIAGNOSIS — J301 Allergic rhinitis due to pollen: Secondary | ICD-10-CM | POA: Diagnosis not present

## 2021-04-28 DIAGNOSIS — J3081 Allergic rhinitis due to animal (cat) (dog) hair and dander: Secondary | ICD-10-CM | POA: Diagnosis not present

## 2021-04-28 DIAGNOSIS — J3089 Other allergic rhinitis: Secondary | ICD-10-CM | POA: Diagnosis not present

## 2021-05-04 DIAGNOSIS — J3081 Allergic rhinitis due to animal (cat) (dog) hair and dander: Secondary | ICD-10-CM | POA: Diagnosis not present

## 2021-05-04 DIAGNOSIS — J3089 Other allergic rhinitis: Secondary | ICD-10-CM | POA: Diagnosis not present

## 2021-05-04 DIAGNOSIS — J301 Allergic rhinitis due to pollen: Secondary | ICD-10-CM | POA: Diagnosis not present

## 2021-05-10 DIAGNOSIS — J301 Allergic rhinitis due to pollen: Secondary | ICD-10-CM | POA: Diagnosis not present

## 2021-05-10 DIAGNOSIS — J3089 Other allergic rhinitis: Secondary | ICD-10-CM | POA: Diagnosis not present

## 2021-05-10 DIAGNOSIS — J3081 Allergic rhinitis due to animal (cat) (dog) hair and dander: Secondary | ICD-10-CM | POA: Diagnosis not present

## 2021-05-12 DIAGNOSIS — J3089 Other allergic rhinitis: Secondary | ICD-10-CM | POA: Diagnosis not present

## 2021-05-12 DIAGNOSIS — J301 Allergic rhinitis due to pollen: Secondary | ICD-10-CM | POA: Diagnosis not present

## 2021-05-12 DIAGNOSIS — J3081 Allergic rhinitis due to animal (cat) (dog) hair and dander: Secondary | ICD-10-CM | POA: Diagnosis not present

## 2021-05-18 DIAGNOSIS — Z125 Encounter for screening for malignant neoplasm of prostate: Secondary | ICD-10-CM | POA: Diagnosis not present

## 2021-05-18 DIAGNOSIS — I10 Essential (primary) hypertension: Secondary | ICD-10-CM | POA: Diagnosis not present

## 2021-05-18 DIAGNOSIS — E291 Testicular hypofunction: Secondary | ICD-10-CM | POA: Diagnosis not present

## 2021-05-18 DIAGNOSIS — R7989 Other specified abnormal findings of blood chemistry: Secondary | ICD-10-CM | POA: Diagnosis not present

## 2021-05-18 DIAGNOSIS — E7800 Pure hypercholesterolemia, unspecified: Secondary | ICD-10-CM | POA: Diagnosis not present

## 2021-05-19 DIAGNOSIS — J301 Allergic rhinitis due to pollen: Secondary | ICD-10-CM | POA: Diagnosis not present

## 2021-05-19 DIAGNOSIS — R052 Subacute cough: Secondary | ICD-10-CM | POA: Diagnosis not present

## 2021-05-19 DIAGNOSIS — J3081 Allergic rhinitis due to animal (cat) (dog) hair and dander: Secondary | ICD-10-CM | POA: Diagnosis not present

## 2021-05-19 DIAGNOSIS — J3089 Other allergic rhinitis: Secondary | ICD-10-CM | POA: Diagnosis not present

## 2021-05-25 DIAGNOSIS — K760 Fatty (change of) liver, not elsewhere classified: Secondary | ICD-10-CM | POA: Diagnosis not present

## 2021-05-25 DIAGNOSIS — N401 Enlarged prostate with lower urinary tract symptoms: Secondary | ICD-10-CM | POA: Diagnosis not present

## 2021-05-25 DIAGNOSIS — M720 Palmar fascial fibromatosis [Dupuytren]: Secondary | ICD-10-CM | POA: Diagnosis not present

## 2021-05-25 DIAGNOSIS — Z0000 Encounter for general adult medical examination without abnormal findings: Secondary | ICD-10-CM | POA: Diagnosis not present

## 2021-05-25 DIAGNOSIS — E7800 Pure hypercholesterolemia, unspecified: Secondary | ICD-10-CM | POA: Diagnosis not present

## 2021-05-25 DIAGNOSIS — R82998 Other abnormal findings in urine: Secondary | ICD-10-CM | POA: Diagnosis not present

## 2021-05-25 DIAGNOSIS — E291 Testicular hypofunction: Secondary | ICD-10-CM | POA: Diagnosis not present

## 2021-05-25 DIAGNOSIS — E669 Obesity, unspecified: Secondary | ICD-10-CM | POA: Diagnosis not present

## 2021-05-25 DIAGNOSIS — I10 Essential (primary) hypertension: Secondary | ICD-10-CM | POA: Diagnosis not present

## 2021-05-28 DIAGNOSIS — J3081 Allergic rhinitis due to animal (cat) (dog) hair and dander: Secondary | ICD-10-CM | POA: Diagnosis not present

## 2021-05-28 DIAGNOSIS — J301 Allergic rhinitis due to pollen: Secondary | ICD-10-CM | POA: Diagnosis not present

## 2021-05-28 DIAGNOSIS — J3089 Other allergic rhinitis: Secondary | ICD-10-CM | POA: Diagnosis not present

## 2021-06-05 DIAGNOSIS — J301 Allergic rhinitis due to pollen: Secondary | ICD-10-CM | POA: Diagnosis not present

## 2021-06-05 DIAGNOSIS — J3081 Allergic rhinitis due to animal (cat) (dog) hair and dander: Secondary | ICD-10-CM | POA: Diagnosis not present

## 2021-06-05 DIAGNOSIS — J3089 Other allergic rhinitis: Secondary | ICD-10-CM | POA: Diagnosis not present

## 2021-06-11 DIAGNOSIS — J3089 Other allergic rhinitis: Secondary | ICD-10-CM | POA: Diagnosis not present

## 2021-06-11 DIAGNOSIS — J3081 Allergic rhinitis due to animal (cat) (dog) hair and dander: Secondary | ICD-10-CM | POA: Diagnosis not present

## 2021-06-11 DIAGNOSIS — J301 Allergic rhinitis due to pollen: Secondary | ICD-10-CM | POA: Diagnosis not present

## 2021-06-16 DIAGNOSIS — J3089 Other allergic rhinitis: Secondary | ICD-10-CM | POA: Diagnosis not present

## 2021-06-16 DIAGNOSIS — J3081 Allergic rhinitis due to animal (cat) (dog) hair and dander: Secondary | ICD-10-CM | POA: Diagnosis not present

## 2021-06-16 DIAGNOSIS — J301 Allergic rhinitis due to pollen: Secondary | ICD-10-CM | POA: Diagnosis not present

## 2021-06-24 DIAGNOSIS — J3081 Allergic rhinitis due to animal (cat) (dog) hair and dander: Secondary | ICD-10-CM | POA: Diagnosis not present

## 2021-06-24 DIAGNOSIS — J301 Allergic rhinitis due to pollen: Secondary | ICD-10-CM | POA: Diagnosis not present

## 2021-06-24 DIAGNOSIS — J3089 Other allergic rhinitis: Secondary | ICD-10-CM | POA: Diagnosis not present

## 2021-06-26 DIAGNOSIS — M79642 Pain in left hand: Secondary | ICD-10-CM | POA: Diagnosis not present

## 2021-06-26 DIAGNOSIS — M79641 Pain in right hand: Secondary | ICD-10-CM | POA: Diagnosis not present

## 2021-07-01 DIAGNOSIS — J3089 Other allergic rhinitis: Secondary | ICD-10-CM | POA: Diagnosis not present

## 2021-07-01 DIAGNOSIS — J301 Allergic rhinitis due to pollen: Secondary | ICD-10-CM | POA: Diagnosis not present

## 2021-07-01 DIAGNOSIS — J3081 Allergic rhinitis due to animal (cat) (dog) hair and dander: Secondary | ICD-10-CM | POA: Diagnosis not present

## 2021-07-10 DIAGNOSIS — J3081 Allergic rhinitis due to animal (cat) (dog) hair and dander: Secondary | ICD-10-CM | POA: Diagnosis not present

## 2021-07-10 DIAGNOSIS — J3089 Other allergic rhinitis: Secondary | ICD-10-CM | POA: Diagnosis not present

## 2021-07-10 DIAGNOSIS — J301 Allergic rhinitis due to pollen: Secondary | ICD-10-CM | POA: Diagnosis not present

## 2021-07-17 DIAGNOSIS — J3089 Other allergic rhinitis: Secondary | ICD-10-CM | POA: Diagnosis not present

## 2021-07-17 DIAGNOSIS — J301 Allergic rhinitis due to pollen: Secondary | ICD-10-CM | POA: Diagnosis not present

## 2021-07-17 DIAGNOSIS — J3081 Allergic rhinitis due to animal (cat) (dog) hair and dander: Secondary | ICD-10-CM | POA: Diagnosis not present

## 2021-07-21 DIAGNOSIS — J3081 Allergic rhinitis due to animal (cat) (dog) hair and dander: Secondary | ICD-10-CM | POA: Diagnosis not present

## 2021-07-21 DIAGNOSIS — J301 Allergic rhinitis due to pollen: Secondary | ICD-10-CM | POA: Diagnosis not present

## 2021-07-21 DIAGNOSIS — J3089 Other allergic rhinitis: Secondary | ICD-10-CM | POA: Diagnosis not present

## 2021-07-31 DIAGNOSIS — J3081 Allergic rhinitis due to animal (cat) (dog) hair and dander: Secondary | ICD-10-CM | POA: Diagnosis not present

## 2021-07-31 DIAGNOSIS — J301 Allergic rhinitis due to pollen: Secondary | ICD-10-CM | POA: Diagnosis not present

## 2021-07-31 DIAGNOSIS — J3089 Other allergic rhinitis: Secondary | ICD-10-CM | POA: Diagnosis not present

## 2021-08-04 DIAGNOSIS — J301 Allergic rhinitis due to pollen: Secondary | ICD-10-CM | POA: Diagnosis not present

## 2021-08-04 DIAGNOSIS — J3081 Allergic rhinitis due to animal (cat) (dog) hair and dander: Secondary | ICD-10-CM | POA: Diagnosis not present

## 2021-08-04 DIAGNOSIS — J3089 Other allergic rhinitis: Secondary | ICD-10-CM | POA: Diagnosis not present

## 2021-08-12 DIAGNOSIS — J3089 Other allergic rhinitis: Secondary | ICD-10-CM | POA: Diagnosis not present

## 2021-08-12 DIAGNOSIS — J301 Allergic rhinitis due to pollen: Secondary | ICD-10-CM | POA: Diagnosis not present

## 2021-08-12 DIAGNOSIS — J3081 Allergic rhinitis due to animal (cat) (dog) hair and dander: Secondary | ICD-10-CM | POA: Diagnosis not present

## 2021-08-17 DIAGNOSIS — H93293 Other abnormal auditory perceptions, bilateral: Secondary | ICD-10-CM | POA: Diagnosis not present

## 2021-08-17 DIAGNOSIS — H6123 Impacted cerumen, bilateral: Secondary | ICD-10-CM | POA: Diagnosis not present

## 2021-08-17 DIAGNOSIS — J0100 Acute maxillary sinusitis, unspecified: Secondary | ICD-10-CM | POA: Diagnosis not present

## 2021-08-19 DIAGNOSIS — J301 Allergic rhinitis due to pollen: Secondary | ICD-10-CM | POA: Diagnosis not present

## 2021-08-19 DIAGNOSIS — J3081 Allergic rhinitis due to animal (cat) (dog) hair and dander: Secondary | ICD-10-CM | POA: Diagnosis not present

## 2021-08-19 DIAGNOSIS — J3089 Other allergic rhinitis: Secondary | ICD-10-CM | POA: Diagnosis not present

## 2021-08-28 DIAGNOSIS — J301 Allergic rhinitis due to pollen: Secondary | ICD-10-CM | POA: Diagnosis not present

## 2021-08-28 DIAGNOSIS — J3089 Other allergic rhinitis: Secondary | ICD-10-CM | POA: Diagnosis not present

## 2021-08-28 DIAGNOSIS — J3081 Allergic rhinitis due to animal (cat) (dog) hair and dander: Secondary | ICD-10-CM | POA: Diagnosis not present

## 2021-09-02 DIAGNOSIS — J301 Allergic rhinitis due to pollen: Secondary | ICD-10-CM | POA: Diagnosis not present

## 2021-09-02 DIAGNOSIS — J3081 Allergic rhinitis due to animal (cat) (dog) hair and dander: Secondary | ICD-10-CM | POA: Diagnosis not present

## 2021-09-02 DIAGNOSIS — J3089 Other allergic rhinitis: Secondary | ICD-10-CM | POA: Diagnosis not present

## 2021-09-10 DIAGNOSIS — J301 Allergic rhinitis due to pollen: Secondary | ICD-10-CM | POA: Diagnosis not present

## 2021-09-10 DIAGNOSIS — J3089 Other allergic rhinitis: Secondary | ICD-10-CM | POA: Diagnosis not present

## 2021-09-10 DIAGNOSIS — J3081 Allergic rhinitis due to animal (cat) (dog) hair and dander: Secondary | ICD-10-CM | POA: Diagnosis not present

## 2021-09-15 DIAGNOSIS — J3089 Other allergic rhinitis: Secondary | ICD-10-CM | POA: Diagnosis not present

## 2021-09-15 DIAGNOSIS — J301 Allergic rhinitis due to pollen: Secondary | ICD-10-CM | POA: Diagnosis not present

## 2021-09-15 DIAGNOSIS — J3081 Allergic rhinitis due to animal (cat) (dog) hair and dander: Secondary | ICD-10-CM | POA: Diagnosis not present

## 2021-09-22 DIAGNOSIS — J3081 Allergic rhinitis due to animal (cat) (dog) hair and dander: Secondary | ICD-10-CM | POA: Diagnosis not present

## 2021-09-22 DIAGNOSIS — J3089 Other allergic rhinitis: Secondary | ICD-10-CM | POA: Diagnosis not present

## 2021-09-22 DIAGNOSIS — J301 Allergic rhinitis due to pollen: Secondary | ICD-10-CM | POA: Diagnosis not present

## 2021-09-29 DIAGNOSIS — J3081 Allergic rhinitis due to animal (cat) (dog) hair and dander: Secondary | ICD-10-CM | POA: Diagnosis not present

## 2021-09-29 DIAGNOSIS — J301 Allergic rhinitis due to pollen: Secondary | ICD-10-CM | POA: Diagnosis not present

## 2021-09-29 DIAGNOSIS — J3089 Other allergic rhinitis: Secondary | ICD-10-CM | POA: Diagnosis not present

## 2021-10-06 DIAGNOSIS — J3089 Other allergic rhinitis: Secondary | ICD-10-CM | POA: Diagnosis not present

## 2021-10-06 DIAGNOSIS — J301 Allergic rhinitis due to pollen: Secondary | ICD-10-CM | POA: Diagnosis not present

## 2021-10-06 DIAGNOSIS — J3081 Allergic rhinitis due to animal (cat) (dog) hair and dander: Secondary | ICD-10-CM | POA: Diagnosis not present

## 2021-10-12 DIAGNOSIS — J301 Allergic rhinitis due to pollen: Secondary | ICD-10-CM | POA: Diagnosis not present

## 2021-10-12 DIAGNOSIS — J3089 Other allergic rhinitis: Secondary | ICD-10-CM | POA: Diagnosis not present

## 2021-10-12 DIAGNOSIS — J3081 Allergic rhinitis due to animal (cat) (dog) hair and dander: Secondary | ICD-10-CM | POA: Diagnosis not present

## 2021-10-15 DIAGNOSIS — J3089 Other allergic rhinitis: Secondary | ICD-10-CM | POA: Diagnosis not present

## 2021-10-15 DIAGNOSIS — J301 Allergic rhinitis due to pollen: Secondary | ICD-10-CM | POA: Diagnosis not present

## 2021-10-15 DIAGNOSIS — J3081 Allergic rhinitis due to animal (cat) (dog) hair and dander: Secondary | ICD-10-CM | POA: Diagnosis not present

## 2021-10-19 DIAGNOSIS — J3089 Other allergic rhinitis: Secondary | ICD-10-CM | POA: Diagnosis not present

## 2021-10-19 DIAGNOSIS — J301 Allergic rhinitis due to pollen: Secondary | ICD-10-CM | POA: Diagnosis not present

## 2021-10-19 DIAGNOSIS — J3081 Allergic rhinitis due to animal (cat) (dog) hair and dander: Secondary | ICD-10-CM | POA: Diagnosis not present

## 2021-10-22 NOTE — Progress Notes (Signed)
PATIENT: Tyler [name] DOB: May 30, 1955  REASON FOR VISIT: follow up HISTORY FROM: patient  Chief Complaint  Patient presents with   Follow-up    Pt in 20  Pt here for CPAP f/u  Pt states he did online essay about migraines  and pt states he doesn't have migraines often anymore since starting CPAP machine     HISTORY OF PRESENT ILLNESS:  Mr. Heffley is a 66 year old male with a history of obstructive sleep apnea on CPAP.  He returns today for follow-up.  Reports that CPAP is working well for him.  Reports he does not like to sleep without his CPAP machine.  His download is below Today 10/26/21:  Mr. Voiles is a 66 year old male with a history of obstructive sleep apnea on CPAP.  His download is below.  He reports that the CPAP is working well for him.  He recently had surgery on his left foot for hammertoe.  He is in a boot today.  He returns today for evaluation.    10/23/19: Ms. Dehn is a 65 year old male with a history of obstructive sleep apnea on CPAP.  His download indicates that he uses machine nightly for compliance of 100%.  He uses machine greater than 4 hours each night.  On average he uses his machine 8 hours and 38 minutes.  His residual AHI is 2.6 on 5 to 12 cm of water.  His leak in the 95th percentile is 13.6 L/min.  He reports that the CPAP is working well for him.  He does not like to sleep without it.  HISTORY 10/23/18:   Mr. Foerster is a 66 year old male with a history of obstructive sleep apnea on CPAP.  He returns today for follow-up.  His download indicates that he uses machine nightly for compliance of 100%.  Every night he uses machine greater than 4 hours.  On average he uses his machine 8 hours and 25 minutes.  His residual AHI is 3.2 on 5 to 12 cm of water with EPR of 1.  His leak in the 95th percentile is 12.7 L/min.  He reports that the CPAP is working well for him.  He definitely can tell the benefit.  He returns today for an  evaluation.  REVIEW OF SYSTEMS: Out of a complete 14 system review of symptoms, the patient complains only of the following symptoms, and all other reviewed systems are negative.  FSS 14 ESS 7  ALLERGIES: Allergies  Allergen Reactions   Lisinopril     cough   Augmentin [Amoxicillin-Pot Clavulanate] Rash    Has patient had a PCN reaction causing immediate rash, facial/tongue/throat swelling, SOB or lightheadedness with hypotension: Yes Has patient had a PCN reaction causing severe rash involving mucus membranes or skin necrosis: No Has patient had a PCN reaction that required hospitalization: No Has patient had a PCN reaction occurring within the last 10 years: Yes If all of the above answers are "NO", then may proceed with Cephalosporin use.    Doxycycline Rash    HOME MEDICATIONS: Outpatient Medications Prior to Visit  Medication Sig Dispense Refill   ALBUTEROL IN Inhale into the lungs.     amLODipine (NORVASC) 10 MG tablet Take 10 mg by mouth daily.     Calcium-Magnesium-Zinc (CAL-MAG-ZINC PO) Take 2 tablets by mouth daily.     Cyanocobalamin (B-12 PO) Take by mouth.     ferrous sulfate 325 (65 FE) MG tablet Take 325 mg by mouth daily.  fluticasone furoate-vilanterol (BREO ELLIPTA) 200-25 MCG/ACT AEPB INHALE 1 PUFF BY MOUTH EVERY DAY     hydrochlorothiazide (HYDRODIURIL) 12.5 MG tablet Take 12.5 mg by mouth daily.     levocetirizine (XYZAL) 5 MG tablet Take 5 mg by mouth every evening.     montelukast (SINGULAIR) 10 MG tablet TK 1 T PO D     olmesartan (BENICAR) 40 MG tablet Take 40 mg by mouth daily.     ondansetron (ZOFRAN) 4 MG tablet Take 1 tablet (4 mg total) by mouth every 8 (eight) hours as needed. 20 tablet 0   sildenafil (VIAGRA) 50 MG tablet Take 50 mg by mouth daily as needed for erectile dysfunction.     Tamsulosin HCl (FLOMAX) 0.4 MG CAPS Take 0.8 mg by mouth daily. 1 tablet daily     fluticasone (FLOVENT DISKUS) 50 MCG/BLIST diskus inhaler Inhale 1 puff into  the lungs 2 (two) times daily.     potassium chloride SA (K-DUR,KLOR-CON) 20 MEQ tablet Take 20 mEq by mouth daily.     No facility-administered medications prior to visit.    PAST MEDICAL HISTORY: Past Medical History:  Diagnosis Date   Arthritis    Asthma    Complication of anesthesia 08/17/2016   pt had urinary retention following last sinus surgery that required him to go to the ED to be in and out cathedx1   History of hiatal hernia    Hypertension    Sleep apnea     PAST SURGICAL HISTORY: Past Surgical History:  Procedure Laterality Date   NASAL SINUS SURGERY  2002   ROTATOR CUFF REPAIR     SEPTOPLASTY WITH ETHMOIDECTOMY, AND MAXILLARY ANTROSTOMY N/A 08/19/2016   Procedure: SEPTOPLASTY WITH ETHMOIDECTOMY, AND MAXILLARY ANTROSTOMY;  Surgeon: [name], MD;  Location: [facility];  Service: ENT;  Laterality: N/A;   TURBINATE REDUCTION N/A 08/19/2016   Procedure: [place];  Surgeon: [name], MD;  Location: [facility];  Service: ENT;  Laterality: N/A;   WISDOM TOOTH EXTRACTION      FAMILY HISTORY: Family History  Problem Relation Age of Onset   Heart failure Mother    Aneurysm Father    Colon cancer Neg Hx    Esophageal cancer Neg Hx    Rectal cancer Neg Hx    Stomach cancer Neg Hx    Sleep apnea Neg Hx     SOCIAL HISTORY: Social History   Socioeconomic History   Marital status: Married    Spouse name: Not on file   Number of children: 2   Years of education: Not on file   Highest education level: Not on file  Occupational History   Occupation: Teacher  Tobacco Use   Smoking status: Some Days    Types: Cigars   Smokeless tobacco: Never   Tobacco comments:    occasional cigar  Vaping Use   Vaping Use: Never used  Substance and Sexual Activity   Alcohol use: Yes    Alcohol/week: 4.0 standard drinks of alcohol    Types: 4 Glasses of wine per week   Drug use: No   Sexual activity: Not on file  Other Topics Concern   Not on file   Social History Narrative   Not on file   Social Determinants of Health   Financial Resource Strain: Not on file  Food Insecurity: Not on file  Transportation Needs: Not on file  Physical Activity: Not on file  Stress: Not on file  Social Connections: Not on file  Intimate Partner Violence: Not on file      PHYSICAL EXAM  Vitals:   10/26/21 0848  BP: 126/77  Pulse: 75  Weight: 234 lb (106.1 kg)  Height: '5\' 10"'$  (1.778 m)   Body mass index is 33.58 kg/m.  Generalized: Well developed, in no acute distress  Chest: Lungs clear to auscultation bilaterally  Neurological examination  Mentation: Alert oriented to time, place, history taking. Follows all commands speech and language fluent Cranial nerve II-XII: Extraocular movements were full, visual field were full on confrontational test Head turning and shoulder shrug  were normal and symmetric.  Gait and station: Gait is normal.    DIAGNOSTIC DATA (LABS, IMAGING, TESTING) - I reviewed patient records, labs, notes, testing and imaging myself where available.  Lab Results  Component Value Date   WBC 5.1 08/17/2016   HGB 14.3 08/17/2016   HCT 41.5 08/17/2016   MCV 80.9 08/17/2016   PLT 204 08/17/2016      Component Value Date/Time   NA 142 08/17/2016 0809   K 3.3 (L) 08/17/2016 0809   CL 106 08/17/2016 0809   CO2 28 08/17/2016 0809   GLUCOSE 103 (H) 08/17/2016 0809   BUN 12 08/17/2016 0809   CREATININE 0.81 08/17/2016 0809   CALCIUM 8.8 (L) 08/17/2016 0809   GFRNONAA >60 08/17/2016 0809   GFRAA >60 08/17/2016 0809      ASSESSMENT AND PLAN 66 y.o. year old male  has a past medical history of Arthritis, Asthma, Complication of anesthesia (08/17/2016), History of hiatal hernia, Hypertension, and Sleep apnea. here with:  OSA on CPAP  - CPAP compliance excellent - Good treatment of AHI  - Encourage patient to use CPAP nightly and > 4 hours each night - F/U in 1 year or sooner if needed    Ward Givens,  MSN, NP-C 10/26/2021, 9:04 AM [facility] [address] [place], [place] [address] [phone]

## 2021-10-26 ENCOUNTER — Ambulatory Visit: Payer: Medicare PPO | Admitting: Adult Health

## 2021-10-26 ENCOUNTER — Encounter: Payer: Self-pay | Admitting: Adult Health

## 2021-10-26 VITALS — BP 126/77 | HR 75 | Ht 70.0 in | Wt 234.0 lb

## 2021-10-26 DIAGNOSIS — J3089 Other allergic rhinitis: Secondary | ICD-10-CM | POA: Diagnosis not present

## 2021-10-26 DIAGNOSIS — J301 Allergic rhinitis due to pollen: Secondary | ICD-10-CM | POA: Diagnosis not present

## 2021-10-26 DIAGNOSIS — G4733 Obstructive sleep apnea (adult) (pediatric): Secondary | ICD-10-CM

## 2021-10-26 DIAGNOSIS — J3081 Allergic rhinitis due to animal (cat) (dog) hair and dander: Secondary | ICD-10-CM | POA: Diagnosis not present

## 2021-10-26 NOTE — Patient Instructions (Signed)
Continue using CPAP nightly and greater than 4 hours each night °If your symptoms worsen or you develop new symptoms please let us know.  ° °

## 2021-10-31 DIAGNOSIS — Z23 Encounter for immunization: Secondary | ICD-10-CM | POA: Diagnosis not present

## 2021-11-12 DIAGNOSIS — J3081 Allergic rhinitis due to animal (cat) (dog) hair and dander: Secondary | ICD-10-CM | POA: Diagnosis not present

## 2021-11-12 DIAGNOSIS — J3089 Other allergic rhinitis: Secondary | ICD-10-CM | POA: Diagnosis not present

## 2021-11-12 DIAGNOSIS — J301 Allergic rhinitis due to pollen: Secondary | ICD-10-CM | POA: Diagnosis not present

## 2021-11-18 DIAGNOSIS — J3081 Allergic rhinitis due to animal (cat) (dog) hair and dander: Secondary | ICD-10-CM | POA: Diagnosis not present

## 2021-11-18 DIAGNOSIS — J301 Allergic rhinitis due to pollen: Secondary | ICD-10-CM | POA: Diagnosis not present

## 2021-11-18 DIAGNOSIS — J3089 Other allergic rhinitis: Secondary | ICD-10-CM | POA: Diagnosis not present

## 2021-11-24 DIAGNOSIS — J3081 Allergic rhinitis due to animal (cat) (dog) hair and dander: Secondary | ICD-10-CM | POA: Diagnosis not present

## 2021-11-24 DIAGNOSIS — J301 Allergic rhinitis due to pollen: Secondary | ICD-10-CM | POA: Diagnosis not present

## 2021-11-24 DIAGNOSIS — J3089 Other allergic rhinitis: Secondary | ICD-10-CM | POA: Diagnosis not present

## 2021-12-02 DIAGNOSIS — J301 Allergic rhinitis due to pollen: Secondary | ICD-10-CM | POA: Diagnosis not present

## 2021-12-02 DIAGNOSIS — J3081 Allergic rhinitis due to animal (cat) (dog) hair and dander: Secondary | ICD-10-CM | POA: Diagnosis not present

## 2021-12-02 DIAGNOSIS — J3089 Other allergic rhinitis: Secondary | ICD-10-CM | POA: Diagnosis not present

## 2021-12-14 DIAGNOSIS — J3089 Other allergic rhinitis: Secondary | ICD-10-CM | POA: Diagnosis not present

## 2021-12-14 DIAGNOSIS — J301 Allergic rhinitis due to pollen: Secondary | ICD-10-CM | POA: Diagnosis not present

## 2021-12-14 DIAGNOSIS — J3081 Allergic rhinitis due to animal (cat) (dog) hair and dander: Secondary | ICD-10-CM | POA: Diagnosis not present

## 2021-12-23 DIAGNOSIS — J3089 Other allergic rhinitis: Secondary | ICD-10-CM | POA: Diagnosis not present

## 2021-12-23 DIAGNOSIS — J3081 Allergic rhinitis due to animal (cat) (dog) hair and dander: Secondary | ICD-10-CM | POA: Diagnosis not present

## 2021-12-23 DIAGNOSIS — J301 Allergic rhinitis due to pollen: Secondary | ICD-10-CM | POA: Diagnosis not present

## 2021-12-29 DIAGNOSIS — J3081 Allergic rhinitis due to animal (cat) (dog) hair and dander: Secondary | ICD-10-CM | POA: Diagnosis not present

## 2021-12-29 DIAGNOSIS — J301 Allergic rhinitis due to pollen: Secondary | ICD-10-CM | POA: Diagnosis not present

## 2021-12-29 DIAGNOSIS — J3089 Other allergic rhinitis: Secondary | ICD-10-CM | POA: Diagnosis not present

## 2021-12-30 DIAGNOSIS — H5203 Hypermetropia, bilateral: Secondary | ICD-10-CM | POA: Diagnosis not present

## 2021-12-30 DIAGNOSIS — H2513 Age-related nuclear cataract, bilateral: Secondary | ICD-10-CM | POA: Diagnosis not present

## 2022-01-10 ENCOUNTER — Other Ambulatory Visit: Payer: Self-pay

## 2022-01-10 ENCOUNTER — Emergency Department (HOSPITAL_COMMUNITY): Payer: Medicare PPO

## 2022-01-10 ENCOUNTER — Observation Stay (HOSPITAL_COMMUNITY)
Admission: EM | Admit: 2022-01-10 | Discharge: 2022-01-12 | Disposition: A | Discharge status: Home / Self Care | Payer: Medicare PPO | Attending: Internal Medicine | Admitting: Internal Medicine

## 2022-01-10 ENCOUNTER — Encounter (HOSPITAL_COMMUNITY): Payer: Self-pay

## 2022-01-10 DIAGNOSIS — R519 Headache, unspecified: Secondary | ICD-10-CM | POA: Diagnosis not present

## 2022-01-10 DIAGNOSIS — F17290 Nicotine dependence, other tobacco product, uncomplicated: Secondary | ICD-10-CM | POA: Diagnosis not present

## 2022-01-10 DIAGNOSIS — Z79899 Other long term (current) drug therapy: Secondary | ICD-10-CM | POA: Diagnosis not present

## 2022-01-10 DIAGNOSIS — R059 Cough, unspecified: Secondary | ICD-10-CM | POA: Diagnosis not present

## 2022-01-10 DIAGNOSIS — J4540 Moderate persistent asthma, uncomplicated: Secondary | ICD-10-CM | POA: Diagnosis not present

## 2022-01-10 DIAGNOSIS — J45909 Unspecified asthma, uncomplicated: Secondary | ICD-10-CM | POA: Insufficient documentation

## 2022-01-10 DIAGNOSIS — I659 Occlusion and stenosis of unspecified precerebral artery: Principal | ICD-10-CM | POA: Insufficient documentation

## 2022-01-10 DIAGNOSIS — I10 Essential (primary) hypertension: Secondary | ICD-10-CM | POA: Diagnosis not present

## 2022-01-10 DIAGNOSIS — I771 Stricture of artery: Secondary | ICD-10-CM | POA: Diagnosis not present

## 2022-01-10 DIAGNOSIS — I639 Cerebral infarction, unspecified: Secondary | ICD-10-CM | POA: Diagnosis present

## 2022-01-10 DIAGNOSIS — I6523 Occlusion and stenosis of bilateral carotid arteries: Secondary | ICD-10-CM | POA: Diagnosis not present

## 2022-01-10 DIAGNOSIS — G4489 Other headache syndrome: Secondary | ICD-10-CM | POA: Diagnosis not present

## 2022-01-10 DIAGNOSIS — G43909 Migraine, unspecified, not intractable, without status migrainosus: Secondary | ICD-10-CM | POA: Diagnosis not present

## 2022-01-10 DIAGNOSIS — R42 Dizziness and giddiness: Secondary | ICD-10-CM | POA: Insufficient documentation

## 2022-01-10 DIAGNOSIS — N179 Acute kidney failure, unspecified: Secondary | ICD-10-CM | POA: Insufficient documentation

## 2022-01-10 DIAGNOSIS — Z1152 Encounter for screening for COVID-19: Secondary | ICD-10-CM | POA: Diagnosis not present

## 2022-01-10 DIAGNOSIS — R1111 Vomiting without nausea: Secondary | ICD-10-CM | POA: Diagnosis not present

## 2022-01-10 DIAGNOSIS — R112 Nausea with vomiting, unspecified: Secondary | ICD-10-CM | POA: Insufficient documentation

## 2022-01-10 DIAGNOSIS — G4733 Obstructive sleep apnea (adult) (pediatric): Secondary | ICD-10-CM | POA: Diagnosis not present

## 2022-01-10 DIAGNOSIS — R110 Nausea: Secondary | ICD-10-CM | POA: Diagnosis not present

## 2022-01-10 LAB — COMPREHENSIVE METABOLIC PANEL
ALT: 23 U/L (ref 0–44)
AST: 26 U/L (ref 15–41)
Albumin: 3.8 g/dL (ref 3.5–5.0)
Alkaline Phosphatase: 40 U/L (ref 38–126)
Anion gap: 9 (ref 5–15)
BUN: 21 mg/dL (ref 8–23)
CO2: 24 mmol/L (ref 22–32)
Calcium: 8.8 mg/dL — ABNORMAL LOW (ref 8.9–10.3)
Chloride: 104 mmol/L (ref 98–111)
Creatinine, Ser: 1.37 mg/dL — ABNORMAL HIGH (ref 0.61–1.24)
GFR, Estimated: 57 mL/min — ABNORMAL LOW (ref >60)
Glucose, Bld: 151 mg/dL — ABNORMAL HIGH (ref 70–99)
Potassium: 4.3 mmol/L (ref 3.5–5.1)
Sodium: 137 mmol/L (ref 135–145)
Total Bilirubin: 0.5 mg/dL (ref 0.3–1.2)
Total Protein: 6.8 g/dL (ref 6.5–8.1)

## 2022-01-10 LAB — CBC WITH DIFFERENTIAL/PLATELET
Abs Immature Granulocytes: 0.01 10*3/uL (ref 0.00–0.07)
Basophils Absolute: 0 10*3/uL (ref 0.0–0.1)
Basophils Relative: 0 %
Eosinophils Absolute: 0.2 10*3/uL (ref 0.0–0.5)
Eosinophils Relative: 4 %
HCT: 39.5 % (ref 39.0–52.0)
Hemoglobin: 13.8 g/dL (ref 13.0–17.0)
Immature Granulocytes: 0 %
Lymphocytes Relative: 23 %
Lymphs Abs: 1.1 10*3/uL (ref 0.7–4.0)
MCH: 29.2 pg (ref 26.0–34.0)
MCHC: 34.9 g/dL (ref 30.0–36.0)
MCV: 83.7 fL (ref 80.0–100.0)
Monocytes Absolute: 0.4 10*3/uL (ref 0.1–1.0)
Monocytes Relative: 8 %
Neutro Abs: 3.2 10*3/uL (ref 1.7–7.7)
Neutrophils Relative %: 65 %
Platelets: 192 10*3/uL (ref 150–400)
RBC: 4.72 MIL/uL (ref 4.22–5.81)
RDW: 12.9 % (ref 11.5–15.5)
WBC: 4.9 10*3/uL (ref 4.0–10.5)
nRBC: 0 % (ref 0.0–0.2)

## 2022-01-10 LAB — TROPONIN I (HIGH SENSITIVITY)
Troponin I (High Sensitivity): 10 ng/L (ref <18)
Troponin I (High Sensitivity): 10 ng/L (ref <18)

## 2022-01-10 LAB — URINALYSIS, ROUTINE W REFLEX MICROSCOPIC
Bacteria, UA: NONE SEEN
Bilirubin Urine: NEGATIVE
Glucose, UA: NEGATIVE mg/dL
Hgb urine dipstick: NEGATIVE
Ketones, ur: 5 mg/dL — AB
Leukocytes,Ua: NEGATIVE
Nitrite: NEGATIVE
Protein, ur: NEGATIVE mg/dL
Specific Gravity, Urine: 1.014 (ref 1.005–1.030)
pH: 7 (ref 5.0–8.0)

## 2022-01-10 LAB — RESP PANEL BY RT-PCR (RSV, FLU A&B, COVID)  RVPGX2
Influenza A by PCR: NEGATIVE
Influenza B by PCR: NEGATIVE
Resp Syncytial Virus by PCR: NEGATIVE
SARS Coronavirus 2 by RT PCR: NEGATIVE

## 2022-01-10 MED ORDER — KETOROLAC TROMETHAMINE 15 MG/ML IJ SOLN
15.0000 mg | Freq: Once | INTRAMUSCULAR | Status: AC
  Administered 2022-01-10: 15 mg via INTRAVENOUS
  Filled 2022-01-10: qty 1

## 2022-01-10 MED ORDER — IOHEXOL 350 MG/ML SOLN
75.0000 mL | Freq: Once | INTRAVENOUS | Status: AC | PRN
  Administered 2022-01-10: 75 mL via INTRAVENOUS

## 2022-01-10 MED ORDER — DIPHENHYDRAMINE HCL 50 MG/ML IJ SOLN
25.0000 mg | Freq: Once | INTRAMUSCULAR | Status: AC
  Administered 2022-01-10: 25 mg via INTRAVENOUS
  Filled 2022-01-10: qty 1

## 2022-01-10 MED ORDER — GADOBUTROL 1 MMOL/ML IV SOLN
10.0000 mL | Freq: Once | INTRAVENOUS | Status: AC | PRN
  Administered 2022-01-10: 10 mL via INTRAVENOUS

## 2022-01-10 MED ORDER — MECLIZINE HCL 25 MG PO TABS
12.5000 mg | ORAL_TABLET | Freq: Once | ORAL | Status: AC
  Administered 2022-01-10: 12.5 mg via ORAL
  Filled 2022-01-10: qty 1

## 2022-01-10 MED ORDER — LACTATED RINGERS IV BOLUS
1000.0000 mL | Freq: Once | INTRAVENOUS | Status: AC
  Administered 2022-01-10: 1000 mL via INTRAVENOUS

## 2022-01-10 MED ORDER — PROCHLORPERAZINE EDISYLATE 10 MG/2ML IJ SOLN
5.0000 mg | Freq: Once | INTRAMUSCULAR | Status: AC
  Administered 2022-01-10: 5 mg via INTRAVENOUS
  Filled 2022-01-10: qty 2

## 2022-01-10 MED ORDER — PROCHLORPERAZINE MALEATE 10 MG PO TABS: 10.0000 mg | ORAL_TABLET | Freq: Two times a day (BID) | ORAL | 0 refills | Status: DC | PRN

## 2022-01-10 MED ORDER — FENTANYL CITRATE PF 50 MCG/ML IJ SOSY
100.0000 ug | PREFILLED_SYRINGE | Freq: Once | INTRAMUSCULAR | Status: AC
  Administered 2022-01-10: 100 ug via INTRAVENOUS
  Filled 2022-01-10: qty 2

## 2022-01-10 MED ORDER — DEXAMETHASONE SODIUM PHOSPHATE 10 MG/ML IJ SOLN
10.0000 mg | Freq: Once | INTRAMUSCULAR | Status: AC
  Administered 2022-01-10: 10 mg via INTRAVENOUS
  Filled 2022-01-10: qty 1

## 2022-01-10 MED ORDER — MAGNESIUM SULFATE 2 GM/50ML IV SOLN
2.0000 g | Freq: Once | INTRAVENOUS | Status: AC
  Administered 2022-01-10: 2 g via INTRAVENOUS
  Filled 2022-01-10: qty 50

## 2022-01-10 MED ORDER — MECLIZINE HCL 12.5 MG PO TABS: 12.5000 mg | ORAL_TABLET | Freq: Three times a day (TID) | ORAL | 0 refills | Status: DC | PRN

## 2022-01-10 MED ORDER — ACETAMINOPHEN 325 MG PO TABS
650.0000 mg | ORAL_TABLET | Freq: Once | ORAL | Status: AC
  Administered 2022-01-10: 650 mg via ORAL
  Filled 2022-01-10: qty 2

## 2022-01-10 MED ORDER — METOCLOPRAMIDE HCL 5 MG/ML IJ SOLN
10.0000 mg | Freq: Once | INTRAMUSCULAR | Status: AC
  Administered 2022-01-10: 10 mg via INTRAVENOUS
  Filled 2022-01-10: qty 2

## 2022-01-10 NOTE — ED Notes (Signed)
Patient transported to CT 

## 2022-01-10 NOTE — ED Provider Notes (Signed)
Physical Exam  BP 134/78   Pulse 92   Temp 98 F (36.7 C) (Oral)   Resp 13   Ht '5\' 10"'$  (1.778 m)   Wt 99.8 kg   SpO2 90%   BMI 31.57 kg/m   Physical Exam Vitals and nursing note reviewed.  Constitutional:      General: He is not in acute distress.    Appearance: He is well-developed.  HENT:     Head: Normocephalic and atraumatic.  Eyes:     Conjunctiva/sclera: Conjunctivae normal.  Cardiovascular:     Rate and Rhythm: Normal rate and regular rhythm.     Heart sounds: No murmur heard. Pulmonary:     Effort: Pulmonary effort is normal. No respiratory distress.     Breath sounds: Normal breath sounds.  Abdominal:     Palpations: Abdomen is soft.     Tenderness: There is no abdominal tenderness.  Musculoskeletal:        General: No swelling.     Cervical back: Neck supple.  Skin:    General: Skin is warm and dry.     Capillary Refill: Capillary refill takes less than 2 seconds.  Neurological:     General: No focal deficit present.     Mental Status: He is alert and oriented to person, place, and time.     Cranial Nerves: No cranial nerve deficit.     Sensory: No sensory deficit.     Motor: No weakness.     Coordination: Coordination normal.     Comments: Horizontal nystagmus noted on physical exam  Psychiatric:        Mood and Affect: Mood normal.     Procedures  Procedures  ED Course / MDM   Clinical Course as of 01/10/22 1623  Sun Jan 10, 2022  0725 Stable 84 YOM with asthma and headache over past 2 days. Dizziness episodes over 2-3 minutes last night. More this morning.  HX of migra [CC]  1022 I evaluated at bedside.  Minor headache.  Reassuring hints exam leftward beating nystagmus Likely peripheral etiology.  Will proceed with CTA to ensure no other intracranial abnormality and reassess following these results.  Last known normal was yesterday at about 12. Symptoms are intermittent. [CC]  1424 Reevaluated.  After third round of medication, patient is  now nearly asymptomatic.  He feels overall more comfortable. I had a prolonged conversation with the patient.  Offered immediate neurologic consultation versus observational care in the outpatient setting with plan for patient to follow-up with his neurologist. Patient elected outpatient neurologic follow-up.  He stated that his symptoms have improved today and that he would like to go home and go to bed and follow-up with his neurologist tomorrow. Given his history of migraines, history of multiple comorbid similar headaches, I believe this is reasonable.  No neurologic symptoms pock, no evidence of intracranial aneurysm, dizziness has improved. Will send patient on Compazine and meclizine and recommend strict return precautions if his headache returns or worsens. The conversation took place with his spouse who provides additional collateral history. [CC]  1914 Consulted neuro and spoke with Dr. Cheral Marker who recommends MRI/ MRV and to follow up with neuro again after the results [RR]    Clinical Course User Index [CC] Tretha Sciara, MD [RR] Sherrell Puller, PA-C   Medical Decision Making Amount and/or Complexity of Data Reviewed Labs: ordered. Radiology: ordered.  Risk OTC drugs. Prescription drug management.  The patient is a 66 year old male with history of HTN who  presented to the emergency department for a right throbbing headache for the past few days and dizziness with associated nausea and vomiting that began yesterday.  From review of the previous providers notes, the patient was woke from sleep at 0300 with acute onset of dizziness and nausea with nonbloody nonbilious emesis.  The patient's workup with previous provider included a CT head, CT angiography, chest x-ray,CBC, CMP, urinalysis, viral respiratory testing, and troponins all of which were largely unremarkable.  Neurology was consulted by the previous team who recommended an MRI/MRV.  The patient was signed out with plan to  follow-up with the MR imaging and reengage neurology with the results.  On my initial assessment, the patient was reporting improvement in his dizziness and nausea.  His physical exam significant for horizontal nystagmus but otherwise largely unremarkable.  I received a call from radiology notifying me of an acute infarct in the right mid and inferior cerebellum and the right PICA territory with mild edema and sulcal effacement but without effacement of the fourth ventricle concerning for thrombosis.  Neurology was reengaged and notified of the results.  The patient was admitted to the stepdown unit for a stroke workup.        Dani Gobble, MD 01/10/22 2349    Lacretia Leigh, MD 01/13/22 2124

## 2022-01-10 NOTE — ED Provider Notes (Signed)
[facility] DEPART[facility] Provider Note   CSN: [id number] Arrival date & time: 01/10/22  8341     History Chief Complaint  Patient presents with   Dizziness    Tyler [name] is a 66 y.o. male with h/o HTN, asthma presents to the ER for evaluation of dizziness, HA, n/v off and on. Patient reports that he has had a throbbing right sided headache off-and-on for the past 2 to 3 days.  He reports that he started having some dizziness worse with movement a few episodes yesterday that lasted between 2 and 3 minutes.  He reports this morning he woke up at 0300 and was dizzy and started experiencing some nausea and a few episodes of nonbloody, nonbilious emesis.  He denies any abdominal pain, chest pain, shortness breath, fevers, or visual changes.  Reports symptoms labia.  He reports a distant history of migraines over a decade ago but these resolved when he was put on a CPAP machine for his obstructive sleep apnea.  He reports that this feels different than his migraines he thinks but is not the worst headache of his life.  He reports he has been having some recent cough and cold symptoms, but had multiple negative at home test for COVID.  Reports occasional cigar use.  Reports occasional wine use.  Denies any illicit drugs.   Dizziness Associated symptoms: headaches, nausea and vomiting   Associated symptoms: no chest pain, no diarrhea and no shortness of breath        Home Medications Prior to Admission medications   Medication Sig Start Date End Date Taking? Authorizing Provider  ALBUTEROL IN Inhale into the lungs.    [provider]  amLODipine (NORVASC) 10 MG tablet Take 10 mg by mouth daily.    [provider]  Calcium-Magnesium-Zinc (CAL-MAG-ZINC PO) Take 2 tablets by mouth daily.    [provider]  Cyanocobalamin (B-12 PO) Take by mouth.    [provider]  ferrous sulfate 325 (65 FE) MG tablet Take 325 mg by mouth  daily.     [provider]  fluticasone (FLOVENT DISKUS) 50 MCG/BLIST diskus inhaler Inhale 1 puff into the lungs 2 (two) times daily.    [provider]  fluticasone furoate-vilanterol (BREO ELLIPTA) 200-25 MCG/ACT AEPB INHALE 1 PUFF BY MOUTH EVERY DAY    [provider]  hydrochlorothiazide (HYDRODIURIL) 12.5 MG tablet Take 12.5 mg by mouth daily.    [provider]  levocetirizine (XYZAL) 5 MG tablet Take 5 mg by mouth every evening.    [provider]  montelukast (SINGULAIR) 10 MG tablet TK 1 T PO D 02/18/18   [provider]  olmesartan (BENICAR) 40 MG tablet Take 40 mg by mouth daily. 09/25/21   [provider]  ondansetron (ZOFRAN) 4 MG tablet Take 1 tablet (4 mg total) by mouth every 8 (eight) hours as needed. 10/15/20   Hyatt, Max T, DPM  potassium chloride SA (K-DUR,KLOR-CON) 20 MEQ tablet Take 20 mEq by mouth daily.    [provider]  sildenafil (VIAGRA) 50 MG tablet Take 50 mg by mouth daily as needed for erectile dysfunction.    [provider]  Tamsulosin HCl (FLOMAX) 0.4 MG CAPS Take 0.8 mg by mouth daily. 1 tablet daily    [provider]      Allergies    Lisinopril, Augmentin [amoxicillin-pot clavulanate], and Doxycycline    Review of Systems   Review of Systems  Constitutional:  Negative for chills and fever.  Eyes:  Positive for photophobia. Negative for visual disturbance.  Respiratory:  Positive for cough. Negative for shortness of breath.   Cardiovascular:  Negative for chest pain.  Gastrointestinal:  Positive for nausea and vomiting. Negative for abdominal pain, constipation and diarrhea.  Genitourinary:  Negative for dysuria and hematuria.  Neurological:  Positive for dizziness and headaches.    Physical Exam Updated Vital Signs BP (!) 119/56   Pulse 65   Temp (!) 97.4 F (36.3 C) (Oral)   Resp 14   Ht '5\' 10"'$  (1.778 m)   Wt 99.8 kg   SpO2 98%   BMI 31.57 kg/m   Physical Exam Constitutional:      Appearance: He is not toxic-appearing.     Comments: Uncomfortable but not toxic appearing  Eyes:     Extraocular Movements: Extraocular movements intact.     Pupils: Pupils are equal, round, and reactive to light.  Cardiovascular:     Rate and Rhythm: Normal rate.  Pulmonary:     Effort: Pulmonary effort is normal. No respiratory distress.  Abdominal:     Palpations: Abdomen is soft.     Tenderness: There is no abdominal tenderness. There is no guarding or rebound.  Skin:    General: Skin is warm and dry.     Capillary Refill: Capillary refill takes less than 2 seconds.  Neurological:     General: No focal deficit present.     Mental Status: He is alert.     GCS: GCS eye subscore is 4. GCS verbal subscore is 5. GCS motor subscore is 6.     Cranial Nerves: Cranial nerves 2-12 are intact. No cranial nerve deficit, dysarthria or facial asymmetry.     Sensory: No sensory deficit.     Motor: No weakness.     Coordination: Finger-Nose-Finger Test normal.     Comments: GCS 15.  Cranial nerves II through XII intact.  No facial asymmetry noted.  Patient is answering questions appropriately with appropriate speech.  Sensation intact throughout.  Strength is 5 out of 5 in patient's upper and lower bilateral extremities.  Normal finger-nose-finger.     ED Results / Procedures / Treatments   Labs (all labs ordered are listed, but only abnormal results are displayed) Labs Reviewed  COMPREHENSIVE METABOLIC PANEL - Abnormal; Notable for the following components:      Result Value   Glucose, Bld 151 (*)    Creatinine, Ser 1.37 (*)    Calcium 8.8 (*)    GFR, Estimated 57 (*)    All other components within normal limits  URINALYSIS, ROUTINE W REFLEX MICROSCOPIC - Abnormal; Notable for the following components:   Ketones, ur 5 (*)    All other components within normal limits  RESP PANEL BY RT-PCR (RSV, FLU A&B, COVID)  RVPGX2  CBC WITH  DIFFERENTIAL/PLATELET  TROPONIN I (HIGH SENSITIVITY)  TROPONIN I (HIGH SENSITIVITY)    EKG None  Radiology No results found.  Procedures Procedures   Medications Ordered in ED Medications  metoCLOPramide (REGLAN) injection 10 mg (has no administration in time range)  lactated ringers bolus 1,000 mL (has no administration in time range)  dexamethasone (DECADRON) injection 10 mg (has no administration in time range)    ED Course/ Medical Decision Making/ A&P Clinical Course as of 01/10/22 1612  Sun Jan 10, 2022  0725 Stable 44 YOM with asthma and headache over past 2 days. Dizziness episodes over 2-3 minutes last night. More this  morning.  HX of migra [CC]  1022 I evaluated at bedside.  Minor headache.  Reassuring hints exam leftward beating nystagmus Likely peripheral etiology.  Will proceed with CTA to ensure no other intracranial abnormality and reassess following these results.  Last known normal was yesterday at about 12. Symptoms are intermittent. [CC]  1424 Reevaluated.  After third round of medication, patient is now nearly asymptomatic.  He feels overall more comfortable. I had a prolonged conversation with the patient.  Offered immediate neurologic consultation versus observational care in the outpatient setting with plan for patient to follow-up with his neurologist. Patient elected outpatient neurologic follow-up.  He stated that his symptoms have improved today and that he would like to go home and go to bed and follow-up with his neurologist tomorrow. Given his history of migraines, history of multiple comorbid similar headaches, I believe this is reasonable.  No neurologic symptoms pock, no evidence of intracranial aneurysm, dizziness has improved. Will send patient on Compazine and meclizine and recommend strict return precautions if his headache returns or worsens. The conversation took place with his spouse who provides additional collateral history. [CC]  4496  Consulted neuro and spoke with Dr. Cheral Marker who recommends MRI/ MRV and to follow up with neuro again after the results [RR]    Clinical Course User Index [CC] Tretha Sciara, MD [RR] Sherrell Puller, PA-C                           Medical Decision Making Amount and/or Complexity of Data Reviewed Labs: ordered. Radiology: ordered.  Risk OTC drugs. Prescription drug management.    66 y/o M presents to the ER for evaluation of headache, dizziness, nausea, and vomiting off and on for the past few days. Differential diagnosis includes but is not limited to Intracranial hemorrhage, meningitis, CVA, intracranial tumor, venous sinus thrombosis, migraine, cluster headache, hypertension, drug related, pseudotumor cerebri, AVM, tension headache, sinusitis, dental abscess, otitis media, TMJ, depression, temporal arteritis, glaucoma, trigeminal neuralgia. Vital signs shows BP 119/56, afebrile, normal pulse rate, satting well on room air without any increase work of breathing. Physical exam as noted above.   The patient reports a distant history of migraines. He reports that this is not the worst headache of his life, however he does not think it felt like his migraines did over a decade ago. Given this, Head CT ordered as well as basic labs.   Migraine cocktail including Decadron, Reglan, Benadryl, Tylenol, and meclizine ordered.  I independently reviewed and interpreted the patient's labs.  CBC is unremarkable.  No leukocytosis or anemia.  Initial troponin 10 with repeat at 10.  Urinalysis shows 5 ketones otherwise unremarkable.  CMP shows mildly elevated glucose at 151. Patient's creatinine is elevated at 1.37 from 0.83, however this was 5 years ago.  I am unable to view patient's prior labs as the documents from care everywhere will not download for [employer] or [employer]. Resp panel shows negative.  CXR shows no acute cardiopulmonary process.  CT of the head shows no acute  intracranial process.  On reevaluation, patient reports that he is still having some dizziness and headache although is mildly improved.  Wife asked if he can take his at home medications of montelukast, spironolactone, tamsulosin, and hydrochlorothiazide.  He will take these medications from home.  On reevaluation, patient is somnolent with equal chest rise, snoring.  Wife reports that he is just been sleeping.  On reevaluation, patient awakens  to voice and reports that he is still having dizziness and his headache is still going on.  Will order him some magnesium to see if his headache improves.  Given the reassuring CT of the head, Toradol also given as well.  CTA of the head and neck ordered to rule out any central cause of vertigo or dissection.  CTA of the head and neck shows 1. Mild atherosclerotic changes at the carotid bifurcations bilaterally without significant stenosis. 2. Normal variant CTA Circle of Willis without significant proximal stenosis, aneurysm, or branch vessel occlusion.  On reevaluation, patient is still experiencing some pain so fentanyl was given.  On reevaluation, my attending assessed at bedside reports the patient was feeling better.  I evaluated the patient at bedside and attempted to ambulate patient.  When he stood up out of bed he felt dizzy and had a headache and dry heaves.  Given that he is not ambulatory without the dizziness or emesis, I do think a neurology consult is warranted at this time.  I spoke with Dr. Cheral Marker with neurology who recommends an MRI and MRV of the head and to follow-up with neurology again after these tests resulted.  Spoke with patient and family about MRI imaging. Neuro exam still unchanged.   Patient reports that he has been under a lot of stress recently try to get his house up-to-date for when his kids visit for Christmas.  He has been painting a house over the past 4 days.  His wife reports that the smell was very strong and she  thinks that this is the culprit of the patient's migraine.  At this time, will handoff to oncoming shift to follow-up on MRIs and reconsultation of neuro and further evaluation assessment.  4:12 PM Care of [facility]are[facility] P Pinson transferred to and Dr. Dani Gobble at the end of my shift as the patient will require reassessment once labs/imaging have resulted. Patient presentation, ED course, and plan of care discussed with review of all pertinent labs and imaging. Please see his/her note for further details regarding further ED course and disposition. Plan at time of handoff is follow up with MRI/MRV and consult neurology again. This may be altered or completely changed at the discretion of the oncoming team pending results of further workup.  I discussed this case with my attending physician who cosigned this note including patient's presenting symptoms, physical exam, and planned diagnostics and interventions. Attending physician stated agreement with plan or made changes to plan which were implemented.   Attending physician assessed patient at bedside.   Final Clinical Impression(s) / ED Diagnoses Final diagnoses:  None    Rx / DC Orders ED Discharge Orders     None         Sherrell Puller, PA-C 01/10/22 1826    Tretha Sciara, MD 01/11/22 1547

## 2022-01-10 NOTE — ED Notes (Signed)
PA aware that the pt is still dizzy and with HA

## 2022-01-10 NOTE — Discharge Instructions (Addendum)
You were seen in the ER today for evaluation of your headache with nausea and vomiting and dizziness.  I am glad that you have improved since being here.  Your imaging and labs were unremarkable other than your kidney function is slightly elevated from being 5 years ago.  I am prescribing you 2 medications.  1 is meclizine for you to take up to 3 times daily as needed for dizziness.  I am also sending home with Compazine also known as prochlorperazine for you to take up to twice daily as needed for nausea and vomiting.  I would like you to stay well-hydrated drinking plenty of fluids, mainly water.  You can also take 1000 mg of Tylenol and/or 600 mg of ibuprofen as needed for pain every 6 hours.  Please follow-up with your neurologist.  If you have any concerns, new or worsening symptoms, please turn to the nearest emergency department for evaluation.  Contact a doctor if: Medicine does not help your symptoms. You have a headache that feels different than the other headaches. You feel like you may vomit (nauseous) or you vomit. You have a fever. Get help right away if: Your headache: Gets very bad quickly. Gets worse after a lot of physical activity. You have any of these symptoms: You continue to vomit. A stiff neck. Trouble seeing. Your eye or ear hurts. Trouble speaking. Weak muscles or you lose muscle control. You lose your balance or have trouble walking. You feel like you will pass out (faint) or you pass out. You are mixed up (confused). You have a seizure. These symptoms may be an emergency. Get help right away. Call your local emergency services (911 in the [place] Do not wait to see if the symptoms will go away. Do not drive yourself to the hospital.

## 2022-01-10 NOTE — ED Notes (Signed)
When entering the room to administer mediation pt was vomiting.  Pt and spouse state it is from the HA.

## 2022-01-10 NOTE — ED Triage Notes (Signed)
Pt had dizziness last night with N/V and again woke up this morning at 0300 as well.  CBG was 152 mg/ dL and pt was given 4 mg Zofran IV by EMS.

## 2022-01-10 NOTE — ED Notes (Signed)
Patient transported to MRI 

## 2022-01-10 NOTE — ED Notes (Signed)
Dr Zenia Resides with pt eating if he can keep food down.  Pt given gingerale and sandwich verbalizes to go slow to not get nauseated again

## 2022-01-11 ENCOUNTER — Observation Stay (HOSPITAL_BASED_OUTPATIENT_CLINIC_OR_DEPARTMENT_OTHER): Payer: Medicare PPO

## 2022-01-11 ENCOUNTER — Encounter (HOSPITAL_COMMUNITY): Payer: Self-pay | Admitting: Family Medicine

## 2022-01-11 DIAGNOSIS — I6389 Other cerebral infarction: Secondary | ICD-10-CM | POA: Diagnosis not present

## 2022-01-11 DIAGNOSIS — I639 Cerebral infarction, unspecified: Secondary | ICD-10-CM | POA: Diagnosis not present

## 2022-01-11 DIAGNOSIS — N179 Acute kidney failure, unspecified: Secondary | ICD-10-CM | POA: Diagnosis present

## 2022-01-11 LAB — ECHOCARDIOGRAM COMPLETE
Area-P 1/2: 3.1 cm2
Height: 70 in
S' Lateral: 3.3 cm
Weight: 3520 oz

## 2022-01-11 LAB — LIPID PANEL
Cholesterol: 159 mg/dL (ref 0–200)
HDL: 27 mg/dL — ABNORMAL LOW (ref >40)
LDL Cholesterol: 111 mg/dL — ABNORMAL HIGH (ref 0–99)
Total CHOL/HDL Ratio: 5.9 RATIO
Triglycerides: 104 mg/dL (ref <150)
VLDL: 21 mg/dL (ref 0–40)

## 2022-01-11 LAB — CBC
HCT: 38.5 % — ABNORMAL LOW (ref 39.0–52.0)
Hemoglobin: 13.4 g/dL (ref 13.0–17.0)
MCH: 29.5 pg (ref 26.0–34.0)
MCHC: 34.8 g/dL (ref 30.0–36.0)
MCV: 84.8 fL (ref 80.0–100.0)
Platelets: 197 10*3/uL (ref 150–400)
RBC: 4.54 MIL/uL (ref 4.22–5.81)
RDW: 13.2 % (ref 11.5–15.5)
WBC: 7.6 10*3/uL (ref 4.0–10.5)
nRBC: 0 % (ref 0.0–0.2)

## 2022-01-11 LAB — BASIC METABOLIC PANEL
Anion gap: 12 (ref 5–15)
BUN: 28 mg/dL — ABNORMAL HIGH (ref 8–23)
CO2: 21 mmol/L — ABNORMAL LOW (ref 22–32)
Calcium: 8.3 mg/dL — ABNORMAL LOW (ref 8.9–10.3)
Chloride: 103 mmol/L (ref 98–111)
Creatinine, Ser: 1.15 mg/dL (ref 0.61–1.24)
GFR, Estimated: >60 mL/min (ref >60)
Glucose, Bld: 121 mg/dL — ABNORMAL HIGH (ref 70–99)
Potassium: 3.7 mmol/L (ref 3.5–5.1)
Sodium: 136 mmol/L (ref 135–145)

## 2022-01-11 LAB — HIV ANTIBODY (ROUTINE TESTING W REFLEX): HIV Screen 4th Generation wRfx: NONREACTIVE

## 2022-01-11 MED ORDER — SODIUM CHLORIDE 0.9 % IV SOLN: INTRAVENOUS | Status: AC

## 2022-01-11 MED ORDER — ATORVASTATIN CALCIUM 10 MG PO TABS: 20.0000 mg | ORAL_TABLET | Freq: Every day | ORAL | Status: DC

## 2022-01-11 MED ORDER — STUDY - LIBREXIA-STROKE - MILVEXIAN 25 MG OR PLACEBO TABLET (PI-SETHI)
1.0000 | ORAL_TABLET | Freq: Two times a day (BID) | ORAL | Status: DC
  Administered 2022-01-11 – 2022-01-12 (×2): 1 via ORAL
  Filled 2022-01-11 (×5): qty 1

## 2022-01-11 MED ORDER — ENOXAPARIN SODIUM 40 MG/0.4ML IJ SOSY
40.0000 mg | PREFILLED_SYRINGE | Freq: Every day | INTRAMUSCULAR | Status: DC
  Administered 2022-01-11: 40 mg via SUBCUTANEOUS
  Filled 2022-01-11: qty 0.4

## 2022-01-11 MED ORDER — TAMSULOSIN HCL 0.4 MG PO CAPS
0.8000 mg | ORAL_CAPSULE | Freq: Every day | ORAL | Status: DC
  Administered 2022-01-11: 0.4 mg via ORAL
  Administered 2022-01-12: 0.8 mg via ORAL
  Filled 2022-01-11 (×2): qty 2

## 2022-01-11 MED ORDER — CLOPIDOGREL BISULFATE 75 MG PO TABS
75.0000 mg | ORAL_TABLET | Freq: Every day | ORAL | Status: DC
  Administered 2022-01-11 – 2022-01-12 (×2): 75 mg via ORAL
  Filled 2022-01-11 (×2): qty 1

## 2022-01-11 MED ORDER — SODIUM CHLORIDE 0.9 % IV SOLN: INTRAVENOUS | Status: DC

## 2022-01-11 MED ORDER — ACETAMINOPHEN 160 MG/5ML PO SOLN: 650.0000 mg | ORAL | Status: DC | PRN

## 2022-01-11 MED ORDER — ATORVASTATIN CALCIUM 40 MG PO TABS
40.0000 mg | ORAL_TABLET | Freq: Every day | ORAL | Status: DC
  Administered 2022-01-11: 40 mg via ORAL
  Filled 2022-01-11: qty 1

## 2022-01-11 MED ORDER — SENNOSIDES-DOCUSATE SODIUM 8.6-50 MG PO TABS: 1.0000 | ORAL_TABLET | Freq: Every evening | ORAL | Status: DC | PRN

## 2022-01-11 MED ORDER — ACETAMINOPHEN 325 MG PO TABS
650.0000 mg | ORAL_TABLET | ORAL | Status: DC | PRN
  Administered 2022-01-11 – 2022-01-12 (×2): 650 mg via ORAL
  Filled 2022-01-11 (×2): qty 2

## 2022-01-11 MED ORDER — STROKE: EARLY STAGES OF RECOVERY BOOK
Freq: Once | Status: AC
  Filled 2022-01-11: qty 1

## 2022-01-11 MED ORDER — ASPIRIN 81 MG PO TBEC
81.0000 mg | DELAYED_RELEASE_TABLET | Freq: Every day | ORAL | Status: DC
  Administered 2022-01-11 – 2022-01-12 (×2): 81 mg via ORAL
  Filled 2022-01-11 (×2): qty 1

## 2022-01-11 MED ORDER — ACETAMINOPHEN 650 MG RE SUPP: 650.0000 mg | RECTAL | Status: DC | PRN

## 2022-01-11 NOTE — Progress Notes (Signed)
PROGRESS NOTE    Kongmeng Santoro Ballin  VCB:[id number] DOB: 03/12/1955 DOA: 01/10/2022 PCP: Haywood Pao, MD   Brief Narrative: 66 year old with past medical history significant for asthma, hypertension, sleep apnea who presents complaining of headache, dizziness, nausea and vomiting.  Patient reports that he has been under a lot of stress, he developed right-sided throbbing headache, subsequently to 11/16 he developed spinning sensation and nausea.  He report vomiting anytime he moves.   MRI of the brain with concern for acute infarct in the right mid and inferior cerebellum.  Neurology was consulted and patient was admitted for further evaluation.   Assessment & Plan:   Principal Problem:   Cerebellar stroke, acute ([place]) Active Problems:   Asthma   Essential hypertension   Obstructive sleep apnea syndrome   AKI (acute kidney injury) ([place])  1-Acute cerebellar stroke: -Presented with headache, nausea and vomiting. -MRI: Acute infarct in the right mid and inferior cerebellum, primarily in the right PICA territory with mild edema and sulcal effacement.   -MRV: Focal susceptibility in the expected location of the right PICA, concerning for thrombosis. No evidence of dural venous sinus or deep cerebral vein thrombosis. -CTA: Mild atherosclerotic changes at the carotid bifurcation bilaterally without significant stenosis.  Normal variant CTA circle of Willis without significant proximal stenosis, aneurysm or branch vessel occlusion.  -LDL: 111, A1c:  -follow neuro recommendations.  ECHO ordered.   2-AKI: In setting of vomiting.  Prior Cr; 0.8 Cr peak to 1.3 Improved with IV fluids.   3-Hypertension: Permissive HTN.   4-Asthma, OSA: Continue with Breo, Singular, PRN alv=buterol.  CPAP/     Estimated body mass index is 31.57 kg/m as calculated from the following:   Height as of this encounter: '5\' 10"'$  (1.778 m).   Weight as of this encounter: 99.8 kg.   DVT  prophylaxis: Lovenox Code Status:  Full code Family Communication: Care discussed with patient. Wife at bedside.  Disposition Plan:  Status is: Observation The patient remains OBS appropriate and will d/c before 2 midnights.    Consultants:  Neurology   Procedures:  ECHO  Antimicrobials:    Subjective: He report headaches, no worsening symptoms.  Had some nausea.    Objective: Vitals:   01/11/22 0400 01/11/22 0500 01/11/22 0600 01/11/22 0630  BP: (!) 150/86 (!) 141/76 131/75 131/75  Pulse: 96 76 67 67  Resp:    16  Temp:    98.1 F (36.7 C)  TempSrc:    Oral  SpO2: 99% 99% 98% 98%  Weight:      Height:        Intake/Output Summary (Last 24 hours) at 01/11/2022 0726 Last data filed at 01/10/2022 3846 Gross per 24 hour  Intake --  Output 600 ml  Net -600 ml   Filed Weights   01/10/22 0617  Weight: 99.8 kg    Examination:  General exam: Appears calm and comfortable  Respiratory system: Clear to auscultation. Respiratory effort normal. Cardiovascular system: S1 & S2 heard, RRR. No JVD, murmurs, rubs, gallops or clicks. No pedal edema. Gastrointestinal system: Abdomen is nondistended, soft and nontender. No organomegaly or masses felt. Normal bowel sounds heard. Central nervous system: Alert and oriented. Mild nystagmus, Motor strength 5/5 Extremities: Symmetric 5 x 5 power. Skin: No rashes, lesions or ulcers Psychiatry: Judgement and insight appear normal. Mood & affect appropriate.     Data Reviewed: I have personally reviewed following labs and imaging studies  CBC: Recent Labs  Lab 01/10/22 0631 01/11/22  0441  WBC 4.9 7.6  NEUTROABS 3.2  --   HGB 13.8 13.4  HCT 39.5 38.5*  MCV 83.7 84.8  PLT 192 765   Basic Metabolic Panel: Recent Labs  Lab 01/10/22 0631 01/11/22 0441  NA 137 136  K 4.3 3.7  CL 104 103  CO2 24 21*  GLUCOSE 151* 121*  BUN 21 28*  CREATININE 1.37* 1.15  CALCIUM 8.8* 8.3*   GFR: Estimated Creatinine Clearance:  73.8 mL/min (by C-G formula based on SCr of 1.15 mg/dL). Liver Function Tests: Recent Labs  Lab 01/10/22 0631  AST 26  ALT 23  ALKPHOS 40  BILITOT 0.5  PROT 6.8  ALBUMIN 3.8   No results for input(s): "LIPASE", "AMYLASE" in the last 168 hours. No results for input(s): "AMMONIA" in the last 168 hours. Coagulation Profile: No results for input(s): "INR", "PROTIME" in the last 168 hours. Cardiac Enzymes: No results for input(s): "CKTOTAL", "CKMB", "CKMBINDEX", "TROPONINI" in the last 168 hours. BNP (last 3 results) No results for input(s): "PROBNP" in the last 8760 hours. HbA1C: No results for input(s): "HGBA1C" in the last 72 hours. CBG: No results for input(s): "GLUCAP" in the last 168 hours. Lipid Profile: No results for input(s): "CHOL", "HDL", "LDLCALC", "TRIG", "CHOLHDL", "LDLDIRECT" in the last 72 hours. Thyroid Function Tests: No results for input(s): "TSH", "T4TOTAL", "FREET4", "T3FREE", "THYROIDAB" in the last 72 hours. Anemia Panel: No results for input(s): "VITAMINB12", "FOLATE", "FERRITIN", "TIBC", "IRON", "RETICCTPCT" in the last 72 hours. Sepsis Labs: No results for input(s): "PROCALCITON", "LATICACIDVEN" in the last 168 hours.  Recent Results (from the past 240 hour(s))  Resp panel by RT-PCR (RSV, Flu A&B, Covid) Anterior Nasal Swab     Status: None   Collection Time: 01/10/22  6:55 AM   Specimen: Anterior Nasal Swab  Result Value Ref Range Status   SARS Coronavirus 2 by RT PCR NEGATIVE NEGATIVE Final    Comment: (NOTE) SARS-CoV-2 target nucleic acids are NOT DETECTED.  The SARS-CoV-2 RNA is generally detectable in upper respiratory specimens during the acute phase of infection. The lowest concentration of SARS-CoV-2 viral copies this assay can detect is 138 copies/mL. A negative result does not preclude SARS-Cov-2 infection and should not be used as the sole basis for treatment or other patient management decisions. A negative result may occur with   improper specimen collection/handling, submission of specimen other than nasopharyngeal swab, presence of viral mutation(s) within the areas targeted by this assay, and inadequate number of viral copies(<138 copies/mL). A negative result must be combined with clinical observations, patient history, and epidemiological information. The expected result is Negative.  Fact Sheet for Patients:  EntrepreneurPulse.com.au  Fact Sheet for Healthcare Providers:  IncredibleEmployment.be  This test is no t yet approved or cleared by the [place] FDA and  has been authorized for detection and/or diagnosis of SARS-CoV-2 by FDA under an Emergency Use Authorization (EUA). This EUA will remain  in effect (meaning this test can be used) for the duration of the COVID-19 declaration under Section 564(b)(1) of the Act, 21 U.S.C.section 360bbb-3(b)(1), unless the authorization is terminated  or revoked sooner.       Influenza A by PCR NEGATIVE NEGATIVE Final   Influenza B by PCR NEGATIVE NEGATIVE Final    Comment: (NOTE) The Xpert Xpress SARS-CoV-2/FLU/RSV plus assay is intended as an aid in the diagnosis of influenza from Nasopharyngeal swab specimens and should not be used as a sole basis for treatment. Nasal washings and aspirates are unacceptable for Xpert  Xpress SARS-CoV-2/FLU/RSV testing.  Fact Sheet for Patients: EntrepreneurPulse.com.au  Fact Sheet for Healthcare Providers: IncredibleEmployment.be  This test is not yet approved or cleared by the [place] FDA and has been authorized for detection and/or diagnosis of SARS-CoV-2 by FDA under an Emergency Use Authorization (EUA). This EUA will remain in effect (meaning this test can be used) for the duration of the COVID-19 declaration under Section 564(b)(1) of the Act, 21 U.S.C. section 360bbb-3(b)(1), unless the authorization is terminated or revoked.      Resp Syncytial Virus by PCR NEGATIVE NEGATIVE Final    Comment: (NOTE) Fact Sheet for Patients: EntrepreneurPulse.com.au  Fact Sheet for Healthcare Providers: IncredibleEmployment.be  This test is not yet approved or cleared by the [place] FDA and has been authorized for detection and/or diagnosis of SARS-CoV-2 by FDA under an Emergency Use Authorization (EUA). This EUA will remain in effect (meaning this test can be used) for the duration of the COVID-19 declaration under Section 564(b)(1) of the Act, 21 U.S.C. section 360bbb-3(b)(1), unless the authorization is terminated or revoked.  Performed at [facility], [place] [address], [place], [place] [address]          Radiology Studies: MR BRAIN WO CONTRAST  Result Date: 01/10/2022 CLINICAL DATA:  Headache, vertigo EXAM: MRI HEAD WITHOUT CONTRAST MRV HEAD WITHOUT AND WITH CONTRAST TECHNIQUE: Multiplanar, multi-echo pulse sequences of the brain and surrounding structures were acquired without intravenous contrast. Angiographic images of the intracranial venous structures were acquired using MRV technique without and with intravenous contrast. COMPARISON:  None Available. CONTRAST:  10 mL gadobutrol FINDINGS: MRI HEAD WITHOUT CONTRAST Brain: Restricted diffusion with ADC correlate in the right mid and inferior cerebellum (series 2, images 3-13), primarily the right PICA territory. This areas associated with mild edema and sulcal effacement. No definite effacement of the fourth ventricle. Focal susceptibility is noted in the expected location of the right PICA (series 7, image 15), concerning for thrombosis. No acute hemorrhage, mass, mass effect, or midline shift. No hemosiderin deposition to suggest remote infarct. Scattered T2 hyperintense signal in the periventricular white matter, likely the sequela of minimal cerebral volume is normal for age. Chronic small vessel ischemic disease.  Vascular: Major arterial flow voids appear patent. As described above, there is susceptibility in the expected location of the right PICA, concerning for thrombosis (series 7, image 15). On postcontrast imaging, contrast is noted in the major intracranial arteries and venous sinuses. Skull and upper cervical spine: Normal marrow signal. Sinuses/Orbits: Mild mucosal thickening in the maxillary sinuses with air-fluid level in the right maxillary sinus. Additional mucosal thickening in the ethmoid air cells. The orbits are unremarkable. Other: Trace fluid in the left mastoid air cells. MR VENOGRAM WITHOUT CONTRAST There is no evidence of dural venous sinus or deep cerebral vein thrombosis. No dural venous sinus stenosis. IMPRESSION: 1. Acute infarct in the right mid and inferior cerebellum, primarily in the right PICA territory, with mild edema and sulcal effacement but no definite effacement of the fourth ventricle. 2. Focal susceptibility in the expected location of the right PICA, concerning for thrombosis. 3. No evidence of dural venous sinus or deep cerebral vein thrombosis. 4. Air-fluid level in the right maxillary sinus, which is nonspecific but can be seen in the setting of acute sinusitis. These results were called by telephone at the time of interpretation on 01/10/2022 at 9:47 pm to provider [facility] , who verbally acknowledged these results. Electronically Signed   By: Francetta Found.D.  On: 01/10/2022 21:47   MR MRV HEAD W WO CONTRAST  Result Date: 01/10/2022 CLINICAL DATA:  Headache, vertigo EXAM: MRI HEAD WITHOUT CONTRAST MRV HEAD WITHOUT AND WITH CONTRAST TECHNIQUE: Multiplanar, multi-echo pulse sequences of the brain and surrounding structures were acquired without intravenous contrast. Angiographic images of the intracranial venous structures were acquired using MRV technique without and with intravenous contrast. COMPARISON:  None Available. CONTRAST:  10 mL gadobutrol FINDINGS: MRI HEAD  WITHOUT CONTRAST Brain: Restricted diffusion with ADC correlate in the right mid and inferior cerebellum (series 2, images 3-13), primarily the right PICA territory. This areas associated with mild edema and sulcal effacement. No definite effacement of the fourth ventricle. Focal susceptibility is noted in the expected location of the right PICA (series 7, image 15), concerning for thrombosis. No acute hemorrhage, mass, mass effect, or midline shift. No hemosiderin deposition to suggest remote infarct. Scattered T2 hyperintense signal in the periventricular white matter, likely the sequela of minimal cerebral volume is normal for age. Chronic small vessel ischemic disease. Vascular: Major arterial flow voids appear patent. As described above, there is susceptibility in the expected location of the right PICA, concerning for thrombosis (series 7, image 15). On postcontrast imaging, contrast is noted in the major intracranial arteries and venous sinuses. Skull and upper cervical spine: Normal marrow signal. Sinuses/Orbits: Mild mucosal thickening in the maxillary sinuses with air-fluid level in the right maxillary sinus. Additional mucosal thickening in the ethmoid air cells. The orbits are unremarkable. Other: Trace fluid in the left mastoid air cells. MR VENOGRAM WITHOUT CONTRAST There is no evidence of dural venous sinus or deep cerebral vein thrombosis. No dural venous sinus stenosis. IMPRESSION: 1. Acute infarct in the right mid and inferior cerebellum, primarily in the right PICA territory, with mild edema and sulcal effacement but no definite effacement of the fourth ventricle. 2. Focal susceptibility in the expected location of the right PICA, concerning for thrombosis. 3. No evidence of dural venous sinus or deep cerebral vein thrombosis. 4. Air-fluid level in the right maxillary sinus, which is nonspecific but can be seen in the setting of acute sinusitis. These results were called by telephone at the time  of interpretation on 01/10/2022 at 9:47 pm to provider [facility] , who verbally acknowledged these results. Electronically Signed   By: Merilyn Baba M.D.   On: 01/10/2022 21:47   CT ANGIO HEAD NECK W WO CM  Result Date: 01/10/2022 CLINICAL DATA:  Vertigo, central. Dizziness last night with nausea and vomiting. Patient awoke at 3 a.m. with similar symptoms. EXAM: CT ANGIOGRAPHY HEAD AND NECK TECHNIQUE: Multidetector CT imaging of the head and neck was performed using the standard protocol during bolus administration of intravenous contrast. Multiplanar CT image reconstructions and MIPs were obtained to evaluate the vascular anatomy. Carotid stenosis measurements (when applicable) are obtained utilizing NASCET criteria, using the distal internal carotid diameter as the denominator. RADIATION DOSE REDUCTION: This exam was performed according to the departmental dose-optimization program which includes automated exposure control, adjustment of the mA and/or kV according to patient size and/or use of iterative reconstruction technique. CONTRAST:  58m OMNIPAQUE IOHEXOL 350 MG/ML SOLN COMPARISON:  CT head without contrast 01/10/2022 mild at 8:05 a.m. FINDINGS: CTA NECK FINDINGS Aortic arch: A common origin of the left common carotid artery and innominate artery noted. No significant stenosis or atherosclerotic disease is present at the aortic arch. Right carotid system: The right common carotid artery is within normal limits. Mild atherosclerotic changes are present along the posterior  aspect of the bifurcation without significant stenosis. Mild tortuosity is present the distal cervical right ICA without significant stenosis. Left carotid system: Left common carotid artery is within normal limits. Atherosclerotic changes are present at the bifurcation without significant stenosis. The cervical left ICA is normal. Vertebral arteries: Right vertebral artery is the dominant vessel. Both vertebral arteries originate from  the subclavian arteries without significant stenosis. Skeleton: Multilevel degenerative changes are present cervical spine. No focal disc disease or stenosis is present. Other neck: Soft tissues the neck are otherwise unremarkable. Salivary glands are within normal limits. Thyroid is normal. No significant adenopathy is present. No focal mucosal or submucosal lesions are present. Upper chest: Mild dependent atelectasis is present. No nodule or mass lesion is present. Thoracic inlet is within normal limits. Review of the MIP images confirms the above findings CTA HEAD FINDINGS Anterior circulation: The internal carotid arteries are within normal limits from high cervical segments through the scratched at the internal carotid arteries are within normal limits through the ICA termini bilaterally. The A1 and M1 segments are normal. The anterior communicating artery is patent. MCA bifurcations are within normal limits bilaterally. The ACA and MCA branch vessels are normal. Posterior circulation: The right vertebral artery dominant vessel. Right PICA origin is visualized and normal. Dominant left AICA vessel is present. Vertebrobasilar junction and basilar artery normal. Left posterior cerebral artery originates from basilar tip. The right posterior cerebral artery is of fetal type. Small right P1 segment is present. PCA branch vessels are within normal limits bilaterally. Venous sinuses: The dural sinuses are patent. The straight sinus and deep cerebral veins are intact. Cortical veins are within normal limits. No significant vascular malformation is evident. Anatomic variants: Fetal type right posterior cerebral artery. Review of the MIP images confirms the above findings IMPRESSION: 1. Mild atherosclerotic changes at the carotid bifurcations bilaterally without significant stenosis. 2. Normal variant CTA Circle of Willis without significant proximal stenosis, aneurysm, or branch vessel occlusion. Electronically Signed    By: San Morelle M.D.   On: 01/10/2022 14:06   CT Head Wo Contrast  Result Date: 01/10/2022 CLINICAL DATA:  Vertigo, peripheral Headache, new onset (Age >= 51y) EXAM: CT HEAD WITHOUT CONTRAST TECHNIQUE: Contiguous axial images were obtained from the base of the skull through the vertex without intravenous contrast. RADIATION DOSE REDUCTION: This exam was performed according to the departmental dose-optimization program which includes automated exposure control, adjustment of the mA and/or kV according to patient size and/or use of iterative reconstruction technique. COMPARISON:  None Available. FINDINGS: Brain: No evidence of acute infarction, hemorrhage, hydrocephalus, extra-axial collection or mass lesion/mass effect. Vascular: No hyperdense vessel or unexpected calcification. Skull: Normal. Negative for fracture or focal lesion. Sinuses/Orbits: Mucoperiosteal thickening consistent with mild chronic pansinusitis. IMPRESSION: No acute intracranial process. Electronically Signed   By: Sammie Bench M.D.   On: 01/10/2022 08:38   DG Chest 2 View  Result Date: 01/10/2022 CLINICAL DATA:  dizziness EXAM: CHEST - 2 VIEW COMPARISON:  08/05/2016. FINDINGS: Cardiac silhouette is unremarkable. No pneumothorax or pleural effusion. The lungs are clear. Right hemidiaphragm is elevated. The visualized skeletal structures are unremarkable. IMPRESSION: Elevated right hemidiaphragm. Otherwise no acute cardiopulmonary process. Electronically Signed   By: Sammie Bench M.D.   On: 01/10/2022 08:33        Scheduled Meds:  [START ON 01/12/2022]  stroke: early stages of recovery book   Does not apply Once   aspirin EC  81 mg Oral Daily   enoxaparin (LOVENOX) injection  40 mg Subcutaneous Daily   tamsulosin  0.8 mg Oral Daily   Continuous Infusions:  sodium chloride 125 mL/hr at 01/11/22 0255     LOS: 0 days    Time spent: 35 minutes.     Elmarie Shiley, MD [facility]   If  7PM-7AM, please contact night-coverage [email or web address]  01/11/2022, 7:26 AM

## 2022-01-11 NOTE — ED Notes (Signed)
ED TO INPATIENT HANDOFF REPORT  ED Nurse Name and Phone #: Luetta Nutting [phone]  S Name/Age/Gender Tyler [name] 67 y.o. male Room/Bed: 003C/003C  Code Status   Code Status: Full Code  Home/SNF/Other Home Patient oriented to: self, place, time, and situation Is this baseline? Yes   Triage Complete: Triage complete  Chief Complaint Cerebellar stroke, acute ([place]) [I63.9]  Triage Note Pt had dizziness last night with N/V and again woke up this morning at 0300 as well.  CBG was 152 mg/ dL and pt was given 4 mg Zofran IV by EMS.   Allergies Allergies  Allergen Reactions   Lisinopril     cough   Augmentin [Amoxicillin-Pot Clavulanate] Rash    Has patient had a PCN reaction causing immediate rash, facial/tongue/throat swelling, SOB or lightheadedness with hypotension: Yes Has patient had a PCN reaction causing severe rash involving mucus membranes or skin necrosis: No Has patient had a PCN reaction that required hospitalization: No Has patient had a PCN reaction occurring within the last 10 years: Yes If all of the above answers are "NO", then may proceed with Cephalosporin use.    Doxycycline Rash    Level of Care/Admitting Diagnosis ED Disposition     ED Disposition  Admit   Condition  --   [place]: [place] [[id number]]  Level of Care: Progressive [102]  Admit to Progressive based on following criteria: NEUROLOGICAL AND NEUROSURGICAL complex patients with significant risk of instability, who do not meet ICU criteria, yet require close observation or frequent assessment (< / = every 2 - 4 hours) with medical / nursing intervention.  May place patient in observation at [facility] or [facility] if equivalent level of care is available:: No  Covid Evaluation: Confirmed COVID Negative  Diagnosis: Cerebellar stroke, acute [facility]) [[id number]]  Admitting Physician: Vianne Bulls [[id number]]  Attending Physician: Vianne Bulls [[id number]]           B Medical/Surgery History Past Medical History:  Diagnosis Date   Arthritis    Asthma    Complication of anesthesia 08/17/2016   pt had urinary retention following last sinus surgery that required him to go to the ED to be in and out [id number]   History of hiatal hernia    Hypertension    Sleep apnea    Past Surgical History:  Procedure Laterality Date   NASAL SINUS SURGERY  2002   ROTATOR CUFF REPAIR     SEPTOPLASTY WITH ETHMOIDECTOMY, AND MAXILLARY ANTROSTOMY N/A 08/19/2016   Procedure: SEPTOPLASTY WITH ETHMOIDECTOMY, AND MAXILLARY ANTROSTOMY;  Surgeon: [name], MD;  Location: [facility];  Service: ENT;  Laterality: N/A;   TURBINATE REDUCTION N/A 08/19/2016   Procedure: BILATERAL TURBINATE REDUCTION;  Surgeon: [name], MD;  Location: [place];  Service: ENT;  Laterality: N/A;   WISDOM TOOTH EXTRACTION       A IV Location/Drains/Wounds Patient Lines/Drains/Airways Status     Active Line/Drains/Airways     Name Placement date Placement time Site Days   Peripheral IV 01/10/22 18 G 1.16" Anterior;Distal;Right;Upper Arm 01/10/22  0559  Arm  1   Incision (Closed) 08/19/16 Nose Other (Comment) 08/19/16  1029  -- 1971            Intake/Output Last 24 hours No intake or output data in the 24 hours ending 01/11/22 1701  Labs/Imaging Results for orders placed or performed during the hospital encounter of 01/10/22 (from the past 48 hour(s))  Troponin I (High  Sensitivity)     Status: None   Collection Time: 01/10/22  6:31 AM  Result Value Ref Range   Troponin I (High Sensitivity) 10 <18 ng/L    Comment: (NOTE) Elevated high sensitivity troponin I (hsTnI) values and significant  changes across serial measurements may suggest ACS but many other  chronic and acute conditions are known to elevate hsTnI results.  Refer to the "Links" section for chest pain algorithms and additional  guidance. Performed at [facility], [place] [address] [place], [place] [address]    CBC with Differential     Status: None   Collection Time: 01/10/22  6:31 AM  Result Value Ref Range   WBC 4.9 4.0 - 10.5 K/uL   RBC 4.72 4.22 - 5.81 MIL/uL   Hemoglobin 13.8 13.0 - 17.0 g/dL   HCT 39.5 39.0 - 52.0 %   MCV 83.7 80.0 - 100.0 fL   MCH 29.2 26.0 - 34.0 pg   MCHC 34.9 30.0 - 36.0 g/dL   RDW 12.9 11.5 - 15.5 %   Platelets 192 150 - 400 K/uL   nRBC 0.0 0.0 - 0.2 %   Neutrophils Relative % 65 %   Neutro Abs 3.2 1.7 - 7.7 K/uL   Lymphocytes Relative 23 %   Lymphs Abs 1.1 0.7 - 4.0 K/uL   Monocytes Relative 8 %   Monocytes Absolute 0.4 0.1 - 1.0 K/uL   Eosinophils Relative 4 %   Eosinophils Absolute 0.2 0.0 - 0.5 K/uL   Basophils Relative 0 %   Basophils Absolute 0.0 0.0 - 0.1 K/uL   Immature Granulocytes 0 %   Abs Immature Granulocytes 0.01 0.00 - 0.07 K/uL    Comment: Performed at [facility], [address] [place], [place] [address]  Comprehensive metabolic panel     Status: Abnormal   Collection Time: 01/10/22  6:31 AM  Result Value Ref Range   Sodium 137 135 - 145 mmol/L   Potassium 4.3 3.5 - 5.1 mmol/L   Chloride 104 98 - 111 mmol/L   CO2 24 22 - 32 mmol/L   Glucose, Bld 151 (H) 70 - 99 mg/dL    Comment: Glucose reference range applies only to samples taken after fasting for at least 8 hours.   BUN 21 8 - 23 mg/dL   Creatinine, Ser 1.37 (H) 0.61 - 1.24 mg/dL   Calcium 8.8 (L) 8.9 - 10.3 mg/dL   Total Protein 6.8 6.5 - 8.1 g/dL   Albumin 3.8 3.5 - 5.0 g/dL   AST 26 15 - 41 U/L   ALT 23 0 - 44 U/L   Alkaline Phosphatase 40 38 - 126 U/L   Total Bilirubin 0.5 0.3 - 1.2 mg/dL   GFR, Estimated 57 (L) >60 mL/min    Comment: (NOTE) Calculated using the CKD-EPI Creatinine Equation (2021)    Anion gap 9 5 - 15    Comment: Performed at [facility] [address] [place], [place] [address]  Urinalysis, Routine w reflex microscopic Urine, Clean Catch     Status: Abnormal   Collection Time: 01/10/22  6:32 AM  Result Value Ref Range   Color,  Urine YELLOW YELLOW   APPearance CLEAR CLEAR   Specific Gravity, Urine 1.014 1.005 - 1.030   pH 7.0 5.0 - 8.0   Glucose, UA NEGATIVE NEGATIVE mg/dL   Hgb urine dipstick NEGATIVE NEGATIVE   Bilirubin Urine NEGATIVE NEGATIVE   Ketones, ur 5 (A) NEGATIVE mg/dL  Protein, ur NEGATIVE NEGATIVE mg/dL   Nitrite NEGATIVE NEGATIVE   Leukocytes,Ua NEGATIVE NEGATIVE   WBC, UA 0-5 0 - 5 WBC/hpf   Bacteria, UA NONE SEEN NONE SEEN    Comment: Performed at [facility] [address], [place], [place] [address]  Resp panel by RT-PCR (RSV, Flu A&B, Covid) Anterior Nasal Swab     Status: None   Collection Time: 01/10/22  6:55 AM   Specimen: Anterior Nasal Swab  Result Value Ref Range   SARS Coronavirus 2 by RT PCR NEGATIVE NEGATIVE    Comment: (NOTE) SARS-CoV-2 target nucleic acids are NOT DETECTED.  The SARS-CoV-2 RNA is generally detectable in upper respiratory specimens during the acute phase of infection. The lowest concentration of SARS-CoV-2 viral copies this assay can detect is 138 copies/mL. A negative result does not preclude SARS-Cov-2 infection and should not be used as the sole basis for treatment or other patient management decisions. A negative result may occur with  improper specimen collection/handling, submission of specimen other than nasopharyngeal swab, presence of viral mutation(s) within the areas targeted by this assay, and inadequate number of viral copies(<138 copies/mL). A negative result must be combined with clinical observations, patient history, and epidemiological information. The expected result is Negative.  Fact Sheet for Patients:  EntrepreneurPulse.com.au  Fact Sheet for Healthcare Providers:  IncredibleEmployment.be  This test is no t yet approved or cleared by the [place] FDA and  has been authorized for detection and/or diagnosis of SARS-CoV-2 by FDA under an Emergency Use Authorization (EUA). This EUA  will remain  in effect (meaning this test can be used) for the duration of the COVID-19 declaration under Section 564(b)(1) of the Act, 21 U.S.C.section 360bbb-3(b)(1), unless the authorization is terminated  or revoked sooner.       Influenza A by PCR NEGATIVE NEGATIVE   Influenza B by PCR NEGATIVE NEGATIVE    Comment: (NOTE) The Xpert Xpress SARS-CoV-2/FLU/RSV plus assay is intended as an aid in the diagnosis of influenza from Nasopharyngeal swab specimens and should not be used as a sole basis for treatment. Nasal washings and aspirates are unacceptable for Xpert Xpress SARS-CoV-2/FLU/RSV testing.  Fact Sheet for Patients: EntrepreneurPulse.com.au  Fact Sheet for Healthcare Providers: IncredibleEmployment.be  This test is not yet approved or cleared by the [place] FDA and has been authorized for detection and/or diagnosis of SARS-CoV-2 by FDA under an Emergency Use Authorization (EUA). This EUA will remain in effect (meaning this test can be used) for the duration of the COVID-19 declaration under Section 564(b)(1) of the Act, 21 U.S.C. section 360bbb-3(b)(1), unless the authorization is terminated or revoked.     Resp Syncytial Virus by PCR NEGATIVE NEGATIVE    Comment: (NOTE) Fact Sheet for Patients: EntrepreneurPulse.com.au  Fact Sheet for Healthcare Providers: IncredibleEmployment.be  This test is not yet approved or cleared by the [place] FDA and has been authorized for detection and/or diagnosis of SARS-CoV-2 by FDA under an Emergency Use Authorization (EUA). This EUA will remain in effect (meaning this test can be used) for the duration of the COVID-19 declaration under Section 564(b)(1) of the Act, 21 U.S.C. section 360bbb-3(b)(1), unless the authorization is terminated or revoked.  Performed at [facility], [place] [address], [place], [place] [address]   Troponin I  (High Sensitivity)     Status: None   Collection Time: 01/10/22  9:01 AM  Result Value Ref Range   Troponin I (High Sensitivity) 10 <18 ng/L  Comment: (NOTE) Elevated high sensitivity troponin I (hsTnI) values and significant  changes across serial measurements may suggest ACS but many other  chronic and acute conditions are known to elevate hsTnI results.  Refer to the "Links" section for chest pain algorithms and additional  guidance. Performed at [facility], [place] [address], [place], [place] [address]   HIV Antibody (routine testing w rflx)     Status: None   Collection Time: 01/11/22  4:41 AM  Result Value Ref Range   HIV Screen 4th Generation wRfx Non Reactive Non Reactive    Comment: Performed at [facility], [place] [address], [place], [place] [address]  Lipid panel     Status: Abnormal   Collection Time: 01/11/22  4:41 AM  Result Value Ref Range   Cholesterol 159 0 - 200 mg/dL   Triglycerides 104 <150 mg/dL   HDL 27 (L) >40 mg/dL   Total CHOL/HDL Ratio 5.9 RATIO   VLDL 21 0 - 40 mg/dL   LDL Cholesterol 111 (H) 0 - 99 mg/dL    Comment:        Total Cholesterol/HDL:CHD Risk Coronary Heart Disease Risk Table                     Men   Women  1/2 Average Risk   3.4   3.3  Average Risk       5.0   4.4  2 X Average Risk   9.6   7.1  3 X Average Risk  23.4   11.0        Use the calculated Patient Ratio above and the CHD Risk Table to determine the patient's CHD Risk.        ATP III CLASSIFICATION (LDL):  <100     mg/dL   Optimal  100-129  mg/dL   Near or Above                    Optimal  130-159  mg/dL   Borderline  160-189  mg/dL   High  >190     mg/dL   Very High Performed at [facility] [address] [place], [place] [address]   CBC     Status: Abnormal   Collection Time: 01/11/22  4:41 AM  Result Value Ref Range   WBC 7.6 4.0 - 10.5 K/uL   RBC 4.54 4.22 - 5.81 MIL/uL   Hemoglobin 13.4 13.0 - 17.0 g/dL   HCT 38.5 (L) 39.0 - 52.0 %    MCV 84.8 80.0 - 100.0 fL   MCH 29.5 26.0 - 34.0 pg   MCHC 34.8 30.0 - 36.0 g/dL   RDW 13.2 11.5 - 15.5 %   Platelets 197 150 - 400 K/uL   nRBC 0.0 0.0 - 0.2 %    Comment: Performed at [facility], [place] [address] [place], [place] [address]  Basic metabolic panel     Status: Abnormal   Collection Time: 01/11/22  4:41 AM  Result Value Ref Range   Sodium 136 135 - 145 mmol/L   Potassium 3.7 3.5 - 5.1 mmol/L   Chloride 103 98 - 111 mmol/L   CO2 21 (L) 22 - 32 mmol/L   Glucose, Bld 121 (H) 70 - 99 mg/dL    Comment: Glucose reference range applies only to samples taken after fasting for at least 8 hours.   BUN 28 (H) 8 - 23 mg/dL   Creatinine,  Ser 1.15 0.61 - 1.24 mg/dL   Calcium 8.3 (L) 8.9 - 10.3 mg/dL   GFR, Estimated >60 >60 mL/min    Comment: (NOTE) Calculated using the CKD-EPI Creatinine Equation (2021)    Anion gap 12 5 - 15    Comment: Performed at [facility] [address], [place], [place] [address]   ECHOCARDIOGRAM COMPLETE  Result Date: 01/11/2022    ECHOCARDIOGRAM REPORT   Patient Name:   DEVAUNTE GASPARINI Date of Exam: 01/11/2022 Medical Rec #:  [id number]          Height:       70.0 in Accession #:    [id number]         Weight:       220.0 lb Date of Birth:  [birth year]         BSA:          2.174 m Patient Age:    87 years           BP:           136/76 mmHg Patient Gender: M                  HR:           66 bpm. Exam Location:  Inpatient Procedure: 2D Echo, Color Doppler and Cardiac Doppler Indications:    Stroke i63.9  History:        Patient has no prior history of Echocardiogram examinations.                 Risk Factors:Hypertension, Dyslipidemia and Sleep Apnea.  Sonographer:    Raquel Sarna Senior RDCS Referring Phys: [id number] [place]  1. Left ventricular ejection fraction, by estimation, is 55 to 60%. The left ventricle has normal function. The left ventricle has no regional wall motion abnormalities. Left ventricular diastolic parameters  were normal.  2. Right ventricular systolic function is normal. The right ventricular size is normal. Tricuspid regurgitation signal is inadequate for assessing PA pressure.  3. The mitral valve is degenerative. Trivial mitral valve regurgitation. No evidence of mitral stenosis.  4. The aortic valve is tricuspid. Aortic valve regurgitation is not visualized. No aortic stenosis is present.  5. The inferior vena cava is dilated in size with >50% respiratory variability, suggesting right atrial pressure of 8 mmHg. Conclusion(s)/Recommendation(s): No intracardiac source of embolism detected on this transthoracic study. Consider a transesophageal echocardiogram to exclude cardiac source of embolism if clinically indicated. FINDINGS  Left Ventricle: Left ventricular ejection fraction, by estimation, is 55 to 60%. The left ventricle has normal function. The left ventricle has no regional wall motion abnormalities. The left ventricular internal cavity size was normal in size. There is  no left ventricular hypertrophy. Left ventricular diastolic parameters were normal. Right Ventricle: The right ventricular size is normal. No increase in right ventricular wall thickness. Right ventricular systolic function is normal. Tricuspid regurgitation signal is inadequate for assessing PA pressure. Left Atrium: Left atrial size was normal in size. Right Atrium: Right atrial size was normal in size. Pericardium: There is no evidence of pericardial effusion. Mitral Valve: The mitral valve is degenerative in appearance. There is mild calcification of the anterior mitral valve leaflet(s). Mild mitral annular calcification. Trivial mitral valve regurgitation. No evidence of mitral valve stenosis. Tricuspid Valve: The tricuspid valve is grossly normal. Tricuspid valve regurgitation is trivial. No evidence of tricuspid stenosis. Aortic Valve: The aortic valve is tricuspid. Aortic valve regurgitation is not visualized.  No aortic stenosis is  present. Pulmonic Valve: The pulmonic valve was grossly normal. Pulmonic valve regurgitation is not visualized. No evidence of pulmonic stenosis. Aorta: The aortic root and ascending aorta are structurally normal, with no evidence of dilitation. Venous: The inferior vena cava is dilated in size with greater than 50% respiratory variability, suggesting right atrial pressure of 8 mmHg. IAS/Shunts: The atrial septum is grossly normal.  LEFT VENTRICLE PLAX 2D LVIDd:         5.20 cm   Diastology LVIDs:         3.30 cm   LV e' medial:    7.83 cm/s LV PW:         1.10 cm   LV E/e' medial:  11.5 LV IVS:        0.80 cm   LV e' lateral:   11.30 cm/s LVOT diam:     2.20 cm   LV E/e' lateral: 8.0 LV SV:         92 LV SV Index:   42 LVOT Area:     3.80 cm  RIGHT VENTRICLE RV S prime:     10.10 cm/s TAPSE (M-mode): 2.6 cm LEFT ATRIUM             Index        RIGHT ATRIUM           Index LA diam:        3.60 cm 1.66 cm/m   RA Area:     18.40 cm LA Vol (A2C):   41.5 ml 19.09 ml/m  RA Volume:   47.50 ml  21.85 ml/m LA Vol (A4C):   78.7 ml 36.21 ml/m LA Biplane Vol: 62.4 ml 28.71 ml/m  AORTIC VALVE LVOT Vmax:   112.00 cm/s LVOT Vmean:  81.000 cm/s LVOT VTI:    0.241 m  AORTA Ao Root diam: 3.10 cm Ao Asc diam:  3.30 cm MITRAL VALVE MV Area (PHT): 3.10 cm    SHUNTS MV Decel Time: 245 msec    Systemic VTI:  0.24 m MV E velocity: 90.30 cm/s  Systemic Diam: 2.20 cm MV A velocity: 85.90 cm/s MV E/A ratio:  1.05 Eleonore Chiquito MD Electronically signed by Eleonore Chiquito MD Signature Date/Time: 01/11/2022/12:20:09 PM    Final    MR BRAIN WO CONTRAST  Result Date: 01/10/2022 CLINICAL DATA:  Headache, vertigo EXAM: MRI HEAD WITHOUT CONTRAST MRV HEAD WITHOUT AND WITH CONTRAST TECHNIQUE: Multiplanar, multi-echo pulse sequences of the brain and surrounding structures were acquired without intravenous contrast. Angiographic images of the intracranial venous structures were acquired using MRV technique without and with intravenous  contrast. COMPARISON:  None Available. CONTRAST:  10 mL gadobutrol FINDINGS: MRI HEAD WITHOUT CONTRAST Brain: Restricted diffusion with ADC correlate in the right mid and inferior cerebellum (series 2, images 3-13), primarily the right PICA territory. This areas associated with mild edema and sulcal effacement. No definite effacement of the fourth ventricle. Focal susceptibility is noted in the expected location of the right PICA (series 7, image 15), concerning for thrombosis. No acute hemorrhage, mass, mass effect, or midline shift. No hemosiderin deposition to suggest remote infarct. Scattered T2 hyperintense signal in the periventricular white matter, likely the sequela of minimal cerebral volume is normal for age. Chronic small vessel ischemic disease. Vascular: Major arterial flow voids appear patent. As described above, there is susceptibility in the expected location of the right PICA, concerning for thrombosis (series 7, image 15). On postcontrast imaging, contrast is noted in the  major intracranial arteries and venous sinuses. Skull and upper cervical spine: Normal marrow signal. Sinuses/Orbits: Mild mucosal thickening in the maxillary sinuses with air-fluid level in the right maxillary sinus. Additional mucosal thickening in the ethmoid air cells. The orbits are unremarkable. Other: Trace fluid in the left mastoid air cells. MR VENOGRAM WITHOUT CONTRAST There is no evidence of dural venous sinus or deep cerebral vein thrombosis. No dural venous sinus stenosis. IMPRESSION: 1. Acute infarct in the right mid and inferior cerebellum, primarily in the right PICA territory, with mild edema and sulcal effacement but no definite effacement of the fourth ventricle. 2. Focal susceptibility in the expected location of the right PICA, concerning for thrombosis. 3. No evidence of dural venous sinus or deep cerebral vein thrombosis. 4. Air-fluid level in the right maxillary sinus, which is nonspecific but can be seen  in the setting of acute sinusitis. These results were called by telephone at the time of interpretation on 01/10/2022 at 9:47 pm to provider [facility] , who verbally acknowledged these results. Electronically Signed   By: Merilyn Baba M.D.   On: 01/10/2022 21:47   MR MRV HEAD W WO CONTRAST  Result Date: 01/10/2022 CLINICAL DATA:  Headache, vertigo EXAM: MRI HEAD WITHOUT CONTRAST MRV HEAD WITHOUT AND WITH CONTRAST TECHNIQUE: Multiplanar, multi-echo pulse sequences of the brain and surrounding structures were acquired without intravenous contrast. Angiographic images of the intracranial venous structures were acquired using MRV technique without and with intravenous contrast. COMPARISON:  None Available. CONTRAST:  10 mL gadobutrol FINDINGS: MRI HEAD WITHOUT CONTRAST Brain: Restricted diffusion with ADC correlate in the right mid and inferior cerebellum (series 2, images 3-13), primarily the right PICA territory. This areas associated with mild edema and sulcal effacement. No definite effacement of the fourth ventricle. Focal susceptibility is noted in the expected location of the right PICA (series 7, image 15), concerning for thrombosis. No acute hemorrhage, mass, mass effect, or midline shift. No hemosiderin deposition to suggest remote infarct. Scattered T2 hyperintense signal in the periventricular white matter, likely the sequela of minimal cerebral volume is normal for age. Chronic small vessel ischemic disease. Vascular: Major arterial flow voids appear patent. As described above, there is susceptibility in the expected location of the right PICA, concerning for thrombosis (series 7, image 15). On postcontrast imaging, contrast is noted in the major intracranial arteries and venous sinuses. Skull and upper cervical spine: Normal marrow signal. Sinuses/Orbits: Mild mucosal thickening in the maxillary sinuses with air-fluid level in the right maxillary sinus. Additional mucosal thickening in the ethmoid air  cells. The orbits are unremarkable. Other: Trace fluid in the left mastoid air cells. MR VENOGRAM WITHOUT CONTRAST There is no evidence of dural venous sinus or deep cerebral vein thrombosis. No dural venous sinus stenosis. IMPRESSION: 1. Acute infarct in the right mid and inferior cerebellum, primarily in the right PICA territory, with mild edema and sulcal effacement but no definite effacement of the fourth ventricle. 2. Focal susceptibility in the expected location of the right PICA, concerning for thrombosis. 3. No evidence of dural venous sinus or deep cerebral vein thrombosis. 4. Air-fluid level in the right maxillary sinus, which is nonspecific but can be seen in the setting of acute sinusitis. These results were called by telephone at the time of interpretation on 01/10/2022 at 9:47 pm to provider [facility] , who verbally acknowledged these results. Electronically Signed   By: Merilyn Baba M.D.   On: 01/10/2022 21:47   CT ANGIO HEAD NECK W WO CM  Result Date: 01/10/2022 CLINICAL DATA:  Vertigo, central. Dizziness last night with nausea and vomiting. Patient awoke at 3 a.m. with similar symptoms. EXAM: CT ANGIOGRAPHY HEAD AND NECK TECHNIQUE: Multidetector CT imaging of the head and neck was performed using the standard protocol during bolus administration of intravenous contrast. Multiplanar CT image reconstructions and MIPs were obtained to evaluate the vascular anatomy. Carotid stenosis measurements (when applicable) are obtained utilizing NASCET criteria, using the distal internal carotid diameter as the denominator. RADIATION DOSE REDUCTION: This exam was performed according to the departmental dose-optimization program which includes automated exposure control, adjustment of the mA and/or kV according to patient size and/or use of iterative reconstruction technique. CONTRAST:  48m OMNIPAQUE IOHEXOL 350 MG/ML SOLN COMPARISON:  CT head without contrast 01/10/2022 mild at 8:05 a.m. FINDINGS: CTA NECK  FINDINGS Aortic arch: A common origin of the left common carotid artery and innominate artery noted. No significant stenosis or atherosclerotic disease is present at the aortic arch. Right carotid system: The right common carotid artery is within normal limits. Mild atherosclerotic changes are present along the posterior aspect of the bifurcation without significant stenosis. Mild tortuosity is present the distal cervical right ICA without significant stenosis. Left carotid system: Left common carotid artery is within normal limits. Atherosclerotic changes are present at the bifurcation without significant stenosis. The cervical left ICA is normal. Vertebral arteries: Right vertebral artery is the dominant vessel. Both vertebral arteries originate from the subclavian arteries without significant stenosis. Skeleton: Multilevel degenerative changes are present cervical spine. No focal disc disease or stenosis is present. Other neck: Soft tissues the neck are otherwise unremarkable. Salivary glands are within normal limits. Thyroid is normal. No significant adenopathy is present. No focal mucosal or submucosal lesions are present. Upper chest: Mild dependent atelectasis is present. No nodule or mass lesion is present. Thoracic inlet is within normal limits. Review of the MIP images confirms the above findings CTA HEAD FINDINGS Anterior circulation: The internal carotid arteries are within normal limits from high cervical segments through the scratched at the internal carotid arteries are within normal limits through the ICA termini bilaterally. The A1 and M1 segments are normal. The anterior communicating artery is patent. MCA bifurcations are within normal limits bilaterally. The ACA and MCA branch vessels are normal. Posterior circulation: The right vertebral artery dominant vessel. Right PICA origin is visualized and normal. Dominant left AICA vessel is present. Vertebrobasilar junction and basilar artery normal.  Left posterior cerebral artery originates from basilar tip. The right posterior cerebral artery is of fetal type. Small right P1 segment is present. PCA branch vessels are within normal limits bilaterally. Venous sinuses: The dural sinuses are patent. The straight sinus and deep cerebral veins are intact. Cortical veins are within normal limits. No significant vascular malformation is evident. Anatomic variants: Fetal type right posterior cerebral artery. Review of the MIP images confirms the above findings IMPRESSION: 1. Mild atherosclerotic changes at the carotid bifurcations bilaterally without significant stenosis. 2. Normal variant CTA Circle of Willis without significant proximal stenosis, aneurysm, or branch vessel occlusion. Electronically Signed   By: CSan MorelleM.D.   On: 01/10/2022 14:06   CT Head Wo Contrast  Result Date: 01/10/2022 CLINICAL DATA:  Vertigo, peripheral Headache, new onset (Age >= 51y) EXAM: CT HEAD WITHOUT CONTRAST TECHNIQUE: Contiguous axial images were obtained from the base of the skull through the vertex without intravenous contrast. RADIATION DOSE REDUCTION: This exam was performed according to the departmental dose-optimization program which includes automated exposure control,  adjustment of the mA and/or kV according to patient size and/or use of iterative reconstruction technique. COMPARISON:  None Available. FINDINGS: Brain: No evidence of acute infarction, hemorrhage, hydrocephalus, extra-axial collection or mass lesion/mass effect. Vascular: No hyperdense vessel or unexpected calcification. Skull: Normal. Negative for fracture or focal lesion. Sinuses/Orbits: Mucoperiosteal thickening consistent with mild chronic pansinusitis. IMPRESSION: No acute intracranial process. Electronically Signed   By: Sammie Bench M.D.   On: 01/10/2022 08:38   DG Chest 2 View  Result Date: 01/10/2022 CLINICAL DATA:  dizziness EXAM: CHEST - 2 VIEW COMPARISON:  08/05/2016.  FINDINGS: Cardiac silhouette is unremarkable. No pneumothorax or pleural effusion. The lungs are clear. Right hemidiaphragm is elevated. The visualized skeletal structures are unremarkable. IMPRESSION: Elevated right hemidiaphragm. Otherwise no acute cardiopulmonary process. Electronically Signed   By: Sammie Bench M.D.   On: 01/10/2022 08:33    Pending Labs Unresulted Labs (From admission, onward)     Start     Ordered   01/11/22 0500  Hemoglobin A1c  (Labs)  Tomorrow morning,   R       Comments: To assess prior glycemic control    01/11/22 0008            Vitals/Pain Today's Vitals   01/11/22 1450 01/11/22 1528 01/11/22 1530 01/11/22 1545  BP: 139/81  128/63 118/68  Pulse: 73  66 (!) 56  Resp: 14  16 (!) 27  Temp:  98 F (36.7 C)    TempSrc:  Oral    SpO2: 96%  96% 96%  Weight:      Height:      PainSc:        Isolation Precautions No active isolations  Medications Medications  tamsulosin (FLOMAX) capsule 0.8 mg (0.4 mg Oral Given 01/11/22 0945)   stroke: early stages of recovery book (has no administration in time range)  acetaminophen (TYLENOL) tablet 650 mg (650 mg Oral Given 01/11/22 0753)    Or  acetaminophen (TYLENOL) 160 MG/5ML solution 650 mg ( Per Tube See Alternative 01/11/22 0753)    Or  acetaminophen (TYLENOL) suppository 650 mg ( Rectal See Alternative 01/11/22 0753)  senna-docusate (Senokot-S) tablet 1 tablet (has no administration in time range)  aspirin EC tablet 81 mg (81 mg Oral Given 01/11/22 0945)  0.9 %  sodium chloride infusion ( Intravenous Rate/Dose Verify 01/11/22 1630)  clopidogrel (PLAVIX) tablet 75 mg (75 mg Oral Given 01/11/22 1416)  atorvastatin (LIPITOR) tablet 40 mg (has no administration in time range)  Study - LIBREXIA-STROKE - milvexian 25 mg or placebo tablet (PI-Sethi) (1 tablet Oral Given 01/11/22 1658)  metoCLOPramide (REGLAN) injection 10 mg (10 mg Intravenous Given 01/10/22 0650)  lactated ringers bolus 1,000 mL (0 mLs  Intravenous Stopped 01/10/22 0739)  dexamethasone (DECADRON) injection 10 mg (10 mg Intravenous Given 01/10/22 0650)  diphenhydrAMINE (BENADRYL) injection 25 mg (25 mg Intravenous Given 01/10/22 0738)  meclizine (ANTIVERT) tablet 12.5 mg (12.5 mg Oral Given 01/10/22 0859)  acetaminophen (TYLENOL) tablet 650 mg (650 mg Oral Given 01/10/22 0859)  ketorolac (TORADOL) 15 MG/ML injection 15 mg (15 mg Intravenous Given 01/10/22 0927)  magnesium sulfate IVPB 2 g 50 mL (0 g Intravenous Stopped 01/10/22 1309)  fentaNYL (SUBLIMAZE) injection 100 mcg (100 mcg Intravenous Given 01/10/22 1348)  prochlorperazine (COMPAZINE) injection 5 mg (5 mg Intravenous Given 01/10/22 1346)  iohexol (OMNIPAQUE) 350 MG/ML injection 75 mL (75 mLs Intravenous Contrast Given 01/10/22 1350)  gadobutrol (GADAVIST) 1 MMOL/ML injection 10 mL (10 mLs Intravenous Contrast Given 01/10/22 2001)  Mobility walks Moderate fall risk   Focused Assessments Neuro Assessment Handoff:  Swallow screen pass? Yes  Cardiac Rhythm: Normal sinus rhythm NIH Stroke Scale ( + Modified Stroke Scale Criteria)  Interval: Initial Level of Consciousness (1a.)   : Alert, keenly responsive LOC Questions (1b. )   +: Answers both questions correctly LOC Commands (1c. )   + : Performs both tasks correctly Best Gaze (2. )  +: Normal Visual (3. )  +: No visual loss Facial Palsy (4. )    : Normal symmetrical movements Motor Arm, Left (5a. )   +: No drift Motor Arm, Right (5b. )   +: No drift Motor Leg, Left (6a. )   +: No drift Motor Leg, Right (6b. )   +: No drift Limb Ataxia (7. ): Absent Sensory (8. )   +: Normal, no sensory loss Best Language (9. )   +: No aphasia Dysarthria (10. ): Normal Extinction/Inattention (11.)   +: No Abnormality Modified SS Total  +: 0 Complete NIHSS TOTAL: 0     Neuro Assessment: Exceptions to WDL Neuro Checks:   Initial (01/11/22 0011)  Last Documented NIHSS Modified Score: 0 (01/11/22 1446) Has TPA been  given? No If patient is a Neuro Trauma and patient is going to OR before floor call report to [facility] nurse: [phone] or [phone]   R Recommendations: See Admitting Provider Note  Report given to:   Additional Notes:

## 2022-01-11 NOTE — Evaluation (Signed)
Occupational Therapy Evaluation Patient Details Name: Tyler [name] MRN: [id number] DOB: 01/13/1956 Today's Date: 01/11/2022   History of Present Illness Pt is a 66 y/o male presenting on 12/17 with headache and dizziness. 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  PMH includes: arthritis, HTN, asthma.   Clinical Impression   PTA patient independent and driving. Admitted for above and presents with problem list below.  Patient completing bed mobility with min guard assist, transfers with min guard to min assist due to mild instability but improves with gaze stabilization, and ADLs with up to min guard assist.  Limited to side stepping at EOB today due to wooziness, but anticipate pt will progress well. Cognition, vision, sensation, coordination appear WFL. Believe he will best benefit from continued OT services acutely and after dc at [facility] vs outpatient level pending progress.  Will follow acutely.      Recommendations for follow up therapy are one component of a multi-disciplinary discharge planning process, led by the attending physician.  Recommendations may be updated based on patient status, additional functional criteria and insurance authorization.   Follow Up Recommendations  Home health OT (vs outpatient pending progress)     Assistance Recommended at Discharge Intermittent Supervision/Assistance  Patient can return home with the following A little help with walking and/or transfers;A little help with bathing/dressing/bathroom;Help with stairs or ramp for entrance;Assist for transportation;Assistance with cooking/housework    Functional Status Assessment  Patient has had a recent decline in their functional status and demonstrates the ability to make significant improvements in function in a reasonable and predictable amount of time.  Equipment Recommendations  BSC/3in1    Recommendations for Other Services       Precautions / Restrictions  Precautions Precautions: Fall Precaution Comments: dizziness Restrictions Weight Bearing Restrictions: No      Mobility Bed Mobility Overal bed mobility: Needs Assistance Bed Mobility: Supine to Sit, Sit to Supine     Supine to sit: Min guard Sit to supine: Min guard        Transfers Overall transfer level: Needs assistance Equipment used: None Transfers: Sit to/from Stand Sit to Stand: Min guard, Min assist           General transfer comment: to steady, mild instability noted due to dizziness      Balance Overall balance assessment: Needs assistance Sitting-balance support: No upper extremity supported, Feet supported Sitting balance-Leahy Scale: Fair     Standing balance support: No upper extremity supported, During functional activity Standing balance-Leahy Scale: Fair Standing balance comment: no UE support, but requires min guard to min assist to maintain balance                           ADL either performed or assessed with clinical judgement   ADL Overall ADL's : Needs assistance/impaired     Grooming: Min guard;Sitting           Upper Body Dressing : Min guard;Sitting   Lower Body Dressing: Min guard;Sit to/from stand Lower Body Dressing Details (indicate cue type and reason): donning socks figure 4 technique, min guard standing Toilet Transfer: Min guard;Ambulation Toilet Transfer Details (indicate cue type and reason): simulated in room (side stepping at EOB)         Functional mobility during ADLs: Min guard General ADL Comments: pt limited by dizziness with movement, educated on focused gaze     Vision Baseline Vision/History: 1 Wears glasses Ability to See in  Adequate Light: 0 Adequate Patient Visual Report: No change from baseline Vision Assessment?: No apparent visual deficits     Perception     Praxis      Pertinent Vitals/Pain Pain Assessment Pain Assessment: 0-10 Pain Score: 5  Pain Location: R sided  headache Pain Descriptors / Indicators: Discomfort Pain Intervention(s): Limited activity within patient's tolerance, Monitored during session, Repositioned, Premedicated before session     Hand Dominance Left   Extremity/Trunk Assessment Upper Extremity Assessment Upper Extremity Assessment: Overall WFL for tasks assessed   Lower Extremity Assessment Lower Extremity Assessment: Defer to PT evaluation   Cervical / Trunk Assessment Cervical / Trunk Assessment: Normal   Communication Communication Communication: No difficulties   Cognition Arousal/Alertness: Awake/alert Behavior During Therapy: WFL for tasks assessed/performed Overall Cognitive Status: Within Functional Limits for tasks assessed                                 General Comments: pt very aware of deficits and need for increased assistance     General Comments  spouse presents and supportive.  SBP decreased from high 150s to 134 from EOB to standing, pt remains woozy from CVA.    Exercises     Shoulder Instructions      Home Living Family/patient expects to be discharged to:: Private residence Living Arrangements: Spouse/significant other Available Help at Discharge: Family Type of Home: House Home Access: Stairs to enter [employer] of Steps: 2-3 garage, 4-5 front with rail Entrance Stairs-Rails: None (garage) Home Layout: Two level;1/2 bath on main level;Bed/bath upstairs Alternate Level Stairs-Number of Steps: flight Alternate Level Stairs-Rails: Left Bathroom Shower/Tub: Tub/shower unit;Walk-in shower   Bathroom Toilet: Standard     Home Equipment: Other (comment);Grab bars - tub/shower (hiking stick)          Prior Functioning/Environment Prior Level of Function : Independent/Modified Independent;Driving             Mobility Comments: using hiking stick on walks, but not needed for mobility ADLs Comments: retired, 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, hospital; driving        OT  Problem List: Decreased activity tolerance;Impaired balance (sitting and/or standing);Decreased knowledge of use of DME or AE;Decreased knowledge of precautions      OT Treatment/Interventions: Self-care/ADL training;Neuromuscular education;DME and/or AE instruction;Therapeutic activities;Visual/perceptual remediation/compensation;Patient/family education;Balance training    OT Goals(Current goals can be found in the care plan section) Acute Rehab OT Goals Patient Stated Goal: less dizzy OT Goal Formulation: With patient Time For Goal Achievement: 01/25/22 Potential to Achieve Goals: Good  OT Frequency: Min 2X/week    Co-evaluation              AM-PAC OT "6 Clicks" Daily Activity     Outcome Measure Help from another person eating meals?: Total (NPO) Help from another person taking care of personal grooming?: A Little Help from another person toileting, which includes using toliet, bedpan, or urinal?: A Little Help from another person bathing (including washing, rinsing, drying)?: A Little Help from another person to put on and taking off regular upper body clothing?: A Little Help from another person to put on and taking off regular lower body clothing?: A Little 6 Click Score: 16   End of Session Equipment Utilized During Treatment: Gait belt Nurse Communication: Mobility status  Activity Tolerance: Patient tolerated treatment well Patient left: in bed;with call bell/phone within reach;with family/visitor present  OT Visit Diagnosis: Other abnormalities of gait  and mobility (R26.89);Dizziness and giddiness (R42)                Time: 3810-1751 OT Time Calculation (min): 29 min Charges:  OT General Charges $OT Visit: 1 Visit OT Evaluation $OT Eval Moderate Complexity: 1 Mod OT Treatments $Self Care/Home Management : 8-22 mins  Jolaine Artist, OT Acute Rehabilitation Services Office [phone]   Delight Stare 01/11/2022, 11:21 AM

## 2022-01-11 NOTE — ED Notes (Signed)
Admitting provider notified that patient uses CPAP at night and that we need an order so RT can set patient up.

## 2022-01-11 NOTE — Progress Notes (Signed)
Echocardiogram 2D Echocardiogram has been performed.  Oneal Deputy Naydeline Morace RDCS 01/11/2022, 12:07 PM

## 2022-01-11 NOTE — Progress Notes (Signed)
 Investigational Drug Service New Study Start: LIBREXIA-STROKE   SUMMARY For more information refer to: [email or web address]. Study Identifier: [id number] A Phase 3, Randomized, Double-Blind, Parallel-Group, Placebo-Controlled Study to Demonstrate the Efficacy and Safety of Milvexian, an Oral Factor XIa Inhibitor, for Stroke Prevention after an Acute Ischemic Stroke or High-Risk Transient Ischemic Attack  Brief Summary This study will evaluate the efficacy and safety of milvexian in participants after an acute ischemic stroke or high-risk TIA who are receiving antiplatelet therapy standard-of-care.  Design Phase 3, Randomized, Double-Blind, Interventional, Event-Driven  Intervention [id number] (Milvexian) 25 mg or Placebo     Concomitant Therapy Participants will receive SAPT or DAPT. The SAPT or DAPT may be started prior to randomization and the type of antiplatelet agent(s), and duration of treatment will be at the discretion of the investigator. If ASA is used, it will be limited to low dose (75 to 100 mg/day) NSAID (except ASA) may be used concomitantly on a temporary basis but should be avoided for chronic use more than 4 weeks of consecutive therapy)  Prohibited Therapy Chronic (>4 weeks of consecutive use) use of ASA >100 mg per day Current or planned use of isoniazid Concomitant use of omeprazole or esomeprazole with clopidogrel is prohibited. Other use of PPI is allowed and encouraged Additional anticoagulants (e.g., vitamin k antagonists, factor IIa or FXa inhibitors) Use of a combined P-gp and strong CYP3A4 inhibitor (e.g., atazanavir, clarithromycin, itraconazole, ketoconazole, ritonavir, saquinavir) within 7 days of receiving study intervention and during the study is prohibited Use of a combined P-gp and strong CYP3A4 inducer (e.g., carbamazepine, phenytoin, rifampin) within 7 days of receiving study intervention and during the study is prohibited * Prohibited therapies  may be administered on a temporary basis, and if administered, the investigator should discontinue the study intervention. Study intervention may be restarted after the prohibited therapy has been discontinued and after the completion of a suitable washout period at the investigator's discretion  Anticoagulation Prophylaxis The use of anticoagulants for post-stroke DVT prophylaxis after the 3-day window is prohibited and non-pharmacological prophylaxis (e.g., intermittent pneumatic compression) is recommended.   Potential Drug-Drug Interactions Milvexian metabolism: Substrate of CYP3A4; use caution with coadministration of strong CYP3A4 inducers and inhibitors  Administration  Take 1 tablet by mouth twice daily without regards to food intake, at approximately the same time each day. For participants unable to swallow medication, the tablet can be dispersed in water and given via NG tube or in applesauce.  Missed dose If a dose of medication is missed, the dose should be taken as soon as possible. If the missed dose cannot be taken at regular time, next dose should not be doubled. Resume at the next scheduled dose      Plan: Start [Milvexian 25 mg tablets or placebo] BID. First dose will be 12/18 evening dose. Study medication must be picked up from pharmacy, medication can not be tubed.   Please ensure study medication is pick up from main pharmacy prior to discharge.    Please contact [facility]S if any questions or concerns regarding the study medication.    Acey Lav, PharmD, [facility] Pharmacist  [phone]

## 2022-01-11 NOTE — Progress Notes (Signed)
Received from ER via stretcher; oriented patient to room and unit routine; family present at bedside; fall safety reviewed; bed is low and alarmed for safety; patient still reports headache and dizziness.

## 2022-01-11 NOTE — Consult Note (Signed)
Neurology Consultation  Reason for Consult: Headache, dizziness, MRI with cerebellar stroke Referring Physician: Dr. Freda Munro, resident/Dr. Zenia Resides, attending  CC: Headache, dizziness  History is obtained from: Patient chart  HPI: Tyler [name] is a 66 y.o. male past medical history of hypertension, sleep apnea, allergies, presenting to the emergency room for evaluation of symptoms of headache and dizzy spells.  Difficult to ascertain last known well because he complains of a headache that has been going on for 2 to 3 days but had sudden onset of dizzy spells that started Saturday evening around 7 PM.  Before that he was only having mild headache and no other neurological symptoms.  At best his last known well is 7 PM on Saturday 01/09/2021. He reports that other than the mild headache that has been going on a few days, he was getting the house ready for the holidays for his children coming to visit and was putting up Christmas trees decorations etc. when he had to suddenly stop and take a moment because he was having a dizzy spell-reports the whole room spinning sensation that lasted a few minutes and got better upon resting.  The dizziness remained persistent and actually became somewhat worse underestimating him to the ER.  He had no localizing signs on exam except for bilateral nystagmus.  Case was discussed with the on-call neurologist, imaging in the form of MRI MRV was recommended due to headache and dizziness symptoms.  MRV was negative but MRI of the head showed a large acute ischemic infarct in the right mid and inferior cerebellum primarily in the right PICA territory with mild edema and sulcal effacement but no definitive fourth ventricular effacement or hydrocephalus.  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 Drinks 1 alcoholic drink a day. Does not smoke cigarettes but has cigars on the weekend. Denies illicit drug use.  LKW: 7 PM on Saturday, 01/09/2021. IV thrombolysis given?: no, outside the window at the time of presentation Premorbid modified Rankin scale (mRS): 0   ROS: Full ROS was performed and is negative except as noted in the HPI.   Past Medical History:  Diagnosis Date   Arthritis    Asthma    Complication of anesthesia 08/17/2016   pt had urinary retention following last sinus surgery that required him to go to the ED to be in and out [id number]   History of hiatal hernia    Hypertension    Sleep apnea    Family History  Problem Relation Age of Onset   Heart failure Mother    Aneurysm Father    Colon cancer Neg Hx    Esophageal cancer Neg Hx    Rectal cancer Neg Hx    Stomach cancer Neg Hx    Sleep apnea Neg Hx    Social History:   reports that he has been smoking cigars. He has never used smokeless tobacco. He reports current alcohol use of about 4.0 standard drinks of alcohol per week. He reports that he does not use drugs.  Medications  Current Facility-Administered Medications:    [START ON 01/12/2022]  stroke: early stages of recovery book, , Does not apply, Once, Opyd, Timothy S, MD   0.9 %  sodium chloride infusion, , Intravenous, Continuous, Opyd, Ilene Qua, MD   acetaminophen (TYLENOL)  tablet 650 mg, 650 mg, Oral, Q4H PRN **OR** acetaminophen (TYLENOL) 160 MG/5ML solution 650 mg, 650 mg, Per Tube, Q4H PRN **OR** acetaminophen (TYLENOL) suppository 650 mg, 650 mg, Rectal, Q4H PRN, Opyd, Ilene Qua, MD   enoxaparin (LOVENOX) injection 40 mg, 40 mg, Subcutaneous, Daily, Opyd, Ilene Qua, MD   senna-docusate (Senokot-S) tablet 1 tablet, 1 tablet, Oral, QHS PRN, Opyd, Ilene Qua, MD   tamsulosin (FLOMAX) capsule 0.8 mg, 0.8 mg, Oral, Daily, Opyd, Ilene Qua, MD  Current Outpatient Medications:    meclizine (ANTIVERT) 12.5 MG tablet,  Take 1 tablet (12.5 mg total) by mouth 3 (three) times daily as needed for dizziness., Disp: 30 tablet, Rfl: 0   prochlorperazine (COMPAZINE) 10 MG tablet, Take 1 tablet (10 mg total) by mouth 2 (two) times daily as needed for nausea or vomiting., Disp: 10 tablet, Rfl: 0   ALBUTEROL IN, Inhale into the lungs., Disp: , Rfl:    amLODipine (NORVASC) 10 MG tablet, Take 10 mg by mouth daily., Disp: , Rfl:    Calcium-Magnesium-Zinc (CAL-MAG-ZINC PO), Take 2 tablets by mouth daily., Disp: , Rfl:    Cyanocobalamin (B-12 PO), Take by mouth., Disp: , Rfl:    ferrous sulfate 325 (65 FE) MG tablet, Take 325 mg by mouth daily. , Disp: , Rfl:    fluticasone (FLOVENT DISKUS) 50 MCG/BLIST diskus inhaler, Inhale 1 puff into the lungs 2 (two) times daily., Disp: , Rfl:    fluticasone furoate-vilanterol (BREO ELLIPTA) 200-25 MCG/ACT AEPB, INHALE 1 PUFF BY MOUTH EVERY DAY, Disp: , Rfl:    hydrochlorothiazide (HYDRODIURIL) 12.5 MG tablet, Take 12.5 mg by mouth daily., Disp: , Rfl:    levocetirizine (XYZAL) 5 MG tablet, Take 5 mg by mouth every evening., Disp: , Rfl:    montelukast (SINGULAIR) 10 MG tablet, TK 1 T PO D, Disp: , Rfl:    olmesartan (BENICAR) 40 MG tablet, Take 40 mg by mouth daily., Disp: , Rfl:    ondansetron (ZOFRAN) 4 MG tablet, Take 1 tablet (4 mg total) by mouth every 8 (eight) hours as needed., Disp: 20 tablet, Rfl: 0   potassium chloride SA (K-DUR,KLOR-CON) 20 MEQ tablet, Take 20 mEq by mouth daily., Disp: , Rfl:    sildenafil (VIAGRA) 50 MG tablet, Take 50 mg by mouth daily as needed for erectile dysfunction., Disp: , Rfl:    Tamsulosin HCl (FLOMAX) 0.4 MG CAPS, Take 0.8 mg by mouth daily. 1 tablet daily, Disp: , Rfl:   Exam: Current vital signs: BP (!) 161/90   Pulse 95   Temp 98 F (36.7 C) (Oral)   Resp 17   Ht '5\' 10"'$  (1.778 m)   Wt 99.8 kg   SpO2 96%   BMI 31.57 kg/m  Vital signs in last 24 hours: Temp:  [97.4 F (36.3 C)-98 F (36.7 C)] 98 F (36.7 C) (12/17 2102) Pulse Rate:   [62-109] 95 (12/18 0030) Resp:  [12-27] 17 (12/17 2102) BP: (112-161)/(51-90) 161/90 (12/18 0030) SpO2:  [90 %-100 %] 96 % (12/18 0030) Weight:  [99.8 kg] 99.8 kg (12/17 0617)  GENERAL: Awake, alert in NAD HEENT: - Normocephalic and atraumatic, dry mm, no LN++, no Thyromegally LUNGS - Clear to auscultation bilaterally with no wheezes CV - S1S2 RRR, no m/r/g, equal pulses bilaterally. ABDOMEN - Soft, nontender, nondistended with normoactive BS Ext: warm, well perfused, intact peripheral pulses, no edema  NEURO:  Mental Status: AA&Ox3  Language: speech is nondysarthric.  Naming, repetition, fluency, and comprehension intact. Cranial Nerves: PERRL extraocular movement  exam reveals nystagmus in both directions which is more prominent while looking to the right with a fast component to the left, visual fields full, no facial asymmetry, facial sensation intact, hearing intact, tongue/uvula/soft palate midline, normal sternocleidomastoid and trapezius muscle strength. No evidence of tongue atrophy or fibrillations Motor: 5/5 in all fours Tone: is normal and bulk is normal Sensation- Intact to light touch bilaterally Coordination: FTN intact bilaterally, no ataxia in BLE. Gait- deferred  NIHSS-0  Labs I have reviewed labs in epic and the results pertinent to this consultation are: CBC    Component Value Date/Time   WBC 4.9 01/10/2022 0631   RBC 4.72 01/10/2022 0631   HGB 13.8 01/10/2022 0631   HCT 39.5 01/10/2022 0631   PLT 192 01/10/2022 0631   MCV 83.7 01/10/2022 0631   MCH 29.2 01/10/2022 0631   MCHC 34.9 01/10/2022 0631   RDW 12.9 01/10/2022 0631   LYMPHSABS 1.1 01/10/2022 0631   MONOABS 0.4 01/10/2022 0631   EOSABS 0.2 01/10/2022 0631   BASOSABS 0.0 01/10/2022 0631    CMP     Component Value Date/Time   NA 137 01/10/2022 0631   K 4.3 01/10/2022 0631   CL 104 01/10/2022 0631   CO2 24 01/10/2022 0631   GLUCOSE 151 (H) 01/10/2022 0631   BUN 21 01/10/2022 0631    CREATININE 1.37 (H) 01/10/2022 0631   CALCIUM 8.8 (L) 01/10/2022 0631   PROT 6.8 01/10/2022 0631   ALBUMIN 3.8 01/10/2022 0631   AST 26 01/10/2022 0631   ALT 23 01/10/2022 0631   ALKPHOS 40 01/10/2022 0631   BILITOT 0.5 01/10/2022 0631   GFRNONAA 57 (L) 01/10/2022 0631   GFRAA >60 08/17/2016 0809   Imaging I have reviewed the images obtained:  CT-head-done at 8 this morning-no acute changes MRI of the brain done later in the day reveals a large right PICA territory infarct with patent fourth ventricle.  Susceptibility artifact in the right PICA CTA head and neck with no clear vascular occlusion or significant stenosis  Assessment: 67 year old with above past medical history presenting for sudden onset of dizzy spells which was preceded by few days worth of headache.  The head CT on arrival was unremarkable but due to persistent symptoms, MRI was ordered that showed a PICA territory infarct which is moderate too large-sized on an MRI without any evidence of full ventricle obstruction or hydrocephalus.  CT angio did not reveal any LVO although there is a question of susceptibility artifact in the right PICA concerning for acute occlusion. His last known well in terms of his dizzy spell is 7 PM on the night prior to presentation but the headaches have been ongoing for a few days.  I am unclear whether the stroke happened yesterday or a few days ago but looking at the normal CT on arrival I would suspect that the 7 PM last known well on 01/08/2022 makes more sense than this being few days old because that would have showed up on the CT. Irrespective, needs admission for risk factor workup and close neuromonitoring  Impression: Acute ischemic stroke-etiology under investigation  Recommendations: Admit to hospitalist under at least progressive level of service given large cerebellar stroke in the PICA territory which may progress to cerebral edema involving the fourth ventricle and cause  hydrocephalus Frequent neurochecks in the progressive floor Telemetry 2D echo A1c Lipid panel Although I do not suspect that he would require hemicraniectomy, I would avoid DAPT and do aspirin only for now High  intensity statin May need long-term outpatient cardiac monitoring Permissive hypertension for another 24 hours or so and then start normalizing blood pressure PT OT speech therapy Plan discussed with Dr. Freda Munro while he called the consult Stroke team to follow  -- Amie Portland, MD Neurologist [facility] Pager: [phone]

## 2022-01-11 NOTE — Progress Notes (Signed)
Pt resting comfortably on CPAP.

## 2022-01-11 NOTE — H&P (Signed)
History and Physical    Tyler [name] ZOX:[id number] DOB: Dec 04, 1955 DOA: 01/10/2022  PCP: Haywood Pao, MD   Patient coming from: Home   Chief Complaint: Headache, dizziness, N/V   HPI: Tyler [name] is a pleasant 66 y.o. male with medical history significant for asthma, hypertension, and sleep apnea who presents to the emergency department with headache, dizziness, nausea, and vomiting.  Patient reports that he has been under a lot of stress recently, was experiencing a right-sided throbbing headache, and then the night of 01/09/2022, he developed a spinning sensation with nausea.  He continued to have a sensation as though he is spinning with nausea and vomiting anytime he moves but feels better when he remains still.  He has never experienced this previously.  He denied focal numbness or weakness, recent fever or chills, chest pain or palpitations.  He states that his asthma has been well-controlled.  ED Course: Upon arrival to the ED, patient is found to be afebrile and saturating mid 90s on room air.  EKG demonstrates sinus rhythm and chest x-ray is notable for elevated right hemidiaphragm.  No acute findings noted on head CT.  MRI brain concerning for acute infarcts in the right mid and inferior cerebellum.  Labs notable for creatinine 1.37.  Patient was treated with acetaminophen, fentanyl, Toradol, Benadryl, meclizine, Reglan, Compazine, Decadron, IV magnesium, and 1 L of LR in the ED.  Neurology was consulted by the ED physician.  Review of Systems:  All other systems reviewed and apart from HPI, are negative.  Past Medical History:  Diagnosis Date   Arthritis    Asthma    Complication of anesthesia 08/17/2016   pt had urinary retention following last sinus surgery that required him to go to the ED to be in and out [id number]   History of hiatal hernia    Hypertension    Sleep apnea     Past Surgical History:  Procedure Laterality Date   NASAL SINUS SURGERY   2002   ROTATOR CUFF REPAIR     SEPTOPLASTY WITH ETHMOIDECTOMY, AND MAXILLARY ANTROSTOMY N/A 08/19/2016   Procedure: SEPTOPLASTY WITH ETHMOIDECTOMY, AND MAXILLARY ANTROSTOMY;  Surgeon: [name], MD;  Location: [facility];  Service: ENT;  Laterality: N/A;   TURBINATE REDUCTION N/A 08/19/2016   Procedure: [place];  Surgeon: [name], MD;  Location: [facility];  Service: ENT;  Laterality: N/A;   WISDOM TOOTH EXTRACTION      Social History:   reports that he has been smoking cigars. He has never used smokeless tobacco. He reports current alcohol use of about 4.0 standard drinks of alcohol per week. He reports that he does not use drugs.  Allergies  Allergen Reactions   Lisinopril     cough   Augmentin [Amoxicillin-Pot Clavulanate] Rash    Has patient had a PCN reaction causing immediate rash, facial/tongue/throat swelling, SOB or lightheadedness with hypotension: Yes Has patient had a PCN reaction causing severe rash involving mucus membranes or skin necrosis: No Has patient had a PCN reaction that required hospitalization: No Has patient had a PCN reaction occurring within the last 10 years: Yes If all of the above answers are "NO", then may proceed with Cephalosporin use.    Doxycycline Rash    Family History  Problem Relation Age of Onset   Heart failure Mother    Aneurysm Father    Colon cancer Neg Hx    Esophageal cancer Neg Hx    Rectal cancer Neg Hx  Stomach cancer Neg Hx    Sleep apnea Neg Hx      Prior to Admission medications   Medication Sig Start Date End Date Taking? Authorizing Provider  meclizine (ANTIVERT) 12.5 MG tablet Take 1 tablet (12.5 mg total) by mouth 3 (three) times daily as needed for dizziness. 01/10/22  Yes Sherrell Puller, PA-C  prochlorperazine (COMPAZINE) 10 MG tablet Take 1 tablet (10 mg total) by mouth 2 (two) times daily as needed for nausea or vomiting. 01/10/22  Yes Sherrell Puller, PA-C  ALBUTEROL IN Inhale into the lungs.     [provider]  amLODipine (NORVASC) 10 MG tablet Take 10 mg by mouth daily.    [provider]  Calcium-Magnesium-Zinc (CAL-MAG-ZINC PO) Take 2 tablets by mouth daily.    [provider]  Cyanocobalamin (B-12 PO) Take by mouth.    [provider]  ferrous sulfate 325 (65 FE) MG tablet Take 325 mg by mouth daily.     [provider]  fluticasone (FLOVENT DISKUS) 50 MCG/BLIST diskus inhaler Inhale 1 puff into the lungs 2 (two) times daily.    [provider]  fluticasone furoate-vilanterol (BREO ELLIPTA) 200-25 MCG/ACT AEPB INHALE 1 PUFF BY MOUTH EVERY DAY    [provider]  hydrochlorothiazide (HYDRODIURIL) 12.5 MG tablet Take 12.5 mg by mouth daily.    [provider]  levocetirizine (XYZAL) 5 MG tablet Take 5 mg by mouth every evening.    [provider]  montelukast (SINGULAIR) 10 MG tablet TK 1 T PO D 02/18/18   [provider]  olmesartan (BENICAR) 40 MG tablet Take 40 mg by mouth daily. 09/25/21   [provider]  ondansetron (ZOFRAN) 4 MG tablet Take 1 tablet (4 mg total) by mouth every 8 (eight) hours as needed. 10/15/20   Hyatt, Max T, DPM  potassium chloride SA (K-DUR,KLOR-CON) 20 MEQ tablet Take 20 mEq by mouth daily.    [provider]  sildenafil (VIAGRA) 50 MG tablet Take 50 mg by mouth daily as needed for erectile dysfunction.    [provider]  Tamsulosin HCl (FLOMAX) 0.4 MG CAPS Take 0.8 mg by mouth daily. 1 tablet daily    [provider]    Physical Exam: Vitals:   01/10/22 2102 01/10/22 2200 01/10/22 2300 01/10/22 2330  BP: (!) 142/79 (!) 145/86 (!) 144/87 (!) 147/82  Pulse: 95 94 94 95  Resp: 17     Temp: 98 F (36.7 C)     TempSrc: Oral     SpO2: 98% 96% 96% 98%  Weight:      Height:        Constitutional: NAD, calm  Eyes: PERTLA, lids and conjunctivae normal ENMT: Mucous membranes are moist. Posterior pharynx clear of any exudate or  lesions.   Neck: supple, no masses  Respiratory: no wheezing, no crackles. No accessory muscle use.  Cardiovascular: S1 & S2 heard, regular rate and rhythm. No extremity edema.   Abdomen: No distension, no tenderness, soft. Bowel sounds active.  Musculoskeletal: no clubbing / cyanosis. No joint deformity upper and lower extremities.   Skin: no significant rashes, lesions, ulcers. Warm, dry, well-perfused. Neurologic: CN 2-12 grossly intact. Sensation to light touch intact. Strength 5/5 in all 4 limbs. Alert and oriented.  Psychiatric: Pleasant. Cooperative.    Labs and Imaging on Admission: I have personally reviewed following labs and imaging studies  CBC: Recent Labs  Lab 01/10/22 0631  WBC 4.9  NEUTROABS 3.2  HGB 13.8  HCT 39.5  MCV 83.7  PLT 026   Basic Metabolic Panel: Recent Labs  Lab 01/10/22 0631  NA 137  K 4.3  CL 104  CO2 24  GLUCOSE 151*  BUN 21  CREATININE 1.37*  CALCIUM 8.8*   GFR: Estimated Creatinine Clearance: 61.9 mL/min (A) (by C-G formula based on SCr of 1.37 mg/dL (H)). Liver Function Tests: Recent Labs  Lab 01/10/22 0631  AST 26  ALT 23  ALKPHOS 40  BILITOT 0.5  PROT 6.8  ALBUMIN 3.8   No results for input(s): "LIPASE", "AMYLASE" in the last 168 hours. No results for input(s): "AMMONIA" in the last 168 hours. Coagulation Profile: No results for input(s): "INR", "PROTIME" in the last 168 hours. Cardiac Enzymes: No results for input(s): "CKTOTAL", "CKMB", "CKMBINDEX", "TROPONINI" in the last 168 hours. BNP (last 3 results) No results for input(s): "PROBNP" in the last 8760 hours. HbA1C: No results for input(s): "HGBA1C" in the last 72 hours. CBG: No results for input(s): "GLUCAP" in the last 168 hours. Lipid Profile: No results for input(s): "CHOL", "HDL", "LDLCALC", "TRIG", "CHOLHDL", "LDLDIRECT" in the last 72 hours. Thyroid Function Tests: No results for input(s): "TSH", "T4TOTAL", "FREET4", "T3FREE", "THYROIDAB" in the last 72  hours. Anemia Panel: No results for input(s): "VITAMINB12", "FOLATE", "FERRITIN", "TIBC", "IRON", "RETICCTPCT" in the last 72 hours. Urine analysis:    Component Value Date/Time   COLORURINE YELLOW 01/10/2022 [place] 01/10/2022 0632   LABSPEC 1.014 01/10/2022 0632   PHURINE 7.0 01/10/2022 0632   GLUCOSEU NEGATIVE 01/10/2022 0632   HGBUR NEGATIVE 01/10/2022 0632   BILIRUBINUR NEGATIVE 01/10/2022 0632   KETONESUR 5 (A) 01/10/2022 0632   PROTEINUR NEGATIVE 01/10/2022 0632   NITRITE NEGATIVE 01/10/2022 0632   LEUKOCYTESUR NEGATIVE 01/10/2022 3785   Sepsis Labs: '@LABRCNTIP'$ (procalcitonin:4,lacticidven:4) ) Recent Results (from the past 240 hour(s))  Resp panel by RT-PCR (RSV, Flu A&B, Covid) Anterior Nasal Swab     Status: None   Collection Time: 01/10/22  6:55 AM   Specimen: Anterior Nasal Swab  Result Value Ref Range Status   SARS Coronavirus 2 by RT PCR NEGATIVE NEGATIVE Final    Comment: (NOTE) SARS-CoV-2 target nucleic acids are NOT DETECTED.  The SARS-CoV-2 RNA is generally detectable in upper respiratory specimens during the acute phase of infection. The lowest concentration of SARS-CoV-2 viral copies this assay can detect is 138 copies/mL. A negative result does not preclude SARS-Cov-2 infection and should not be used as the sole basis for treatment or other patient management decisions. A negative result may occur with  improper specimen collection/handling, submission of specimen other than nasopharyngeal swab, presence of viral mutation(s) within the areas targeted by this assay, and inadequate number of viral copies(<138 copies/mL). A negative result must be combined with clinical observations, patient history, and epidemiological information. The expected result is Negative.  Fact Sheet for Patients:  EntrepreneurPulse.com.au  Fact Sheet for Healthcare Providers:  IncredibleEmployment.be  This test is no t  yet approved or cleared by the [place] FDA and  has been authorized for detection and/or diagnosis of SARS-CoV-2 by FDA under an Emergency Use Authorization (EUA). This EUA will remain  in effect (meaning this test can be used) for the duration of the COVID-19 declaration under Section 564(b)(1) of the Act, 21 U.S.C.section 360bbb-3(b)(1), unless the authorization is terminated  or revoked sooner.       Influenza A by PCR NEGATIVE NEGATIVE Final   Influenza B by PCR NEGATIVE NEGATIVE Final    Comment: (  NOTE) The Xpert Xpress SARS-CoV-2/FLU/RSV plus assay is intended as an aid in the diagnosis of influenza from Nasopharyngeal swab specimens and should not be used as a sole basis for treatment. Nasal washings and aspirates are unacceptable for Xpert Xpress SARS-CoV-2/FLU/RSV testing.  Fact Sheet for Patients: EntrepreneurPulse.com.au  Fact Sheet for Healthcare Providers: IncredibleEmployment.be  This test is not yet approved or cleared by the [place] FDA and has been authorized for detection and/or diagnosis of SARS-CoV-2 by FDA under an Emergency Use Authorization (EUA). This EUA will remain in effect (meaning this test can be used) for the duration of the COVID-19 declaration under Section 564(b)(1) of the Act, 21 U.S.C. section 360bbb-3(b)(1), unless the authorization is terminated or revoked.     Resp Syncytial Virus by PCR NEGATIVE NEGATIVE Final    Comment: (NOTE) Fact Sheet for Patients: EntrepreneurPulse.com.au  Fact Sheet for Healthcare Providers: IncredibleEmployment.be  This test is not yet approved or cleared by the [place] FDA and has been authorized for detection and/or diagnosis of SARS-CoV-2 by FDA under an Emergency Use Authorization (EUA). This EUA will remain in effect (meaning this test can be used) for the duration of the COVID-19 declaration under Section 564(b)(1)  of the Act, 21 U.S.C. section 360bbb-3(b)(1), unless the authorization is terminated or revoked.  Performed at [facility], [place] [address] [place], [place] [address]      Radiological Exams on Admission: MR BRAIN WO CONTRAST  Result Date: 01/10/2022 CLINICAL DATA:  Headache, vertigo EXAM: MRI HEAD WITHOUT CONTRAST MRV HEAD WITHOUT AND WITH CONTRAST TECHNIQUE: Multiplanar, multi-echo pulse sequences of the brain and surrounding structures were acquired without intravenous contrast. Angiographic images of the intracranial venous structures were acquired using MRV technique without and with intravenous contrast. COMPARISON:  None Available. CONTRAST:  10 mL gadobutrol FINDINGS: MRI HEAD WITHOUT CONTRAST Brain: Restricted diffusion with ADC correlate in the right mid and inferior cerebellum (series 2, images 3-13), primarily the right PICA territory. This areas associated with mild edema and sulcal effacement. No definite effacement of the fourth ventricle. Focal susceptibility is noted in the expected location of the right PICA (series 7, image 15), concerning for thrombosis. No acute hemorrhage, mass, mass effect, or midline shift. No hemosiderin deposition to suggest remote infarct. Scattered T2 hyperintense signal in the periventricular white matter, likely the sequela of minimal cerebral volume is normal for age. Chronic small vessel ischemic disease. Vascular: Major arterial flow voids appear patent. As described above, there is susceptibility in the expected location of the right PICA, concerning for thrombosis (series 7, image 15). On postcontrast imaging, contrast is noted in the major intracranial arteries and venous sinuses. Skull and upper cervical spine: Normal marrow signal. Sinuses/Orbits: Mild mucosal thickening in the maxillary sinuses with air-fluid level in the right maxillary sinus. Additional mucosal thickening in the ethmoid air cells. The orbits are unremarkable. Other: Trace  fluid in the left mastoid air cells. MR VENOGRAM WITHOUT CONTRAST There is no evidence of dural venous sinus or deep cerebral vein thrombosis. No dural venous sinus stenosis. IMPRESSION: 1. Acute infarct in the right mid and inferior cerebellum, primarily in the right PICA territory, with mild edema and sulcal effacement but no definite effacement of the fourth ventricle. 2. Focal susceptibility in the expected location of the right PICA, concerning for thrombosis. 3. No evidence of dural venous sinus or deep cerebral vein thrombosis. 4. Air-fluid level in the right maxillary sinus, which is nonspecific but can be seen in the setting of  acute sinusitis. These results were called by telephone at the time of interpretation on 01/10/2022 at 9:47 pm to provider [facility] , who verbally acknowledged these results. Electronically Signed   By: Merilyn Baba M.D.   On: 01/10/2022 21:47   MR MRV HEAD W WO CONTRAST  Result Date: 01/10/2022 CLINICAL DATA:  Headache, vertigo EXAM: MRI HEAD WITHOUT CONTRAST MRV HEAD WITHOUT AND WITH CONTRAST TECHNIQUE: Multiplanar, multi-echo pulse sequences of the brain and surrounding structures were acquired without intravenous contrast. Angiographic images of the intracranial venous structures were acquired using MRV technique without and with intravenous contrast. COMPARISON:  None Available. CONTRAST:  10 mL gadobutrol FINDINGS: MRI HEAD WITHOUT CONTRAST Brain: Restricted diffusion with ADC correlate in the right mid and inferior cerebellum (series 2, images 3-13), primarily the right PICA territory. This areas associated with mild edema and sulcal effacement. No definite effacement of the fourth ventricle. Focal susceptibility is noted in the expected location of the right PICA (series 7, image 15), concerning for thrombosis. No acute hemorrhage, mass, mass effect, or midline shift. No hemosiderin deposition to suggest remote infarct. Scattered T2 hyperintense signal in the  periventricular white matter, likely the sequela of minimal cerebral volume is normal for age. Chronic small vessel ischemic disease. Vascular: Major arterial flow voids appear patent. As described above, there is susceptibility in the expected location of the right PICA, concerning for thrombosis (series 7, image 15). On postcontrast imaging, contrast is noted in the major intracranial arteries and venous sinuses. Skull and upper cervical spine: Normal marrow signal. Sinuses/Orbits: Mild mucosal thickening in the maxillary sinuses with air-fluid level in the right maxillary sinus. Additional mucosal thickening in the ethmoid air cells. The orbits are unremarkable. Other: Trace fluid in the left mastoid air cells. MR VENOGRAM WITHOUT CONTRAST There is no evidence of dural venous sinus or deep cerebral vein thrombosis. No dural venous sinus stenosis. IMPRESSION: 1. Acute infarct in the right mid and inferior cerebellum, primarily in the right PICA territory, with mild edema and sulcal effacement but no definite effacement of the fourth ventricle. 2. Focal susceptibility in the expected location of the right PICA, concerning for thrombosis. 3. No evidence of dural venous sinus or deep cerebral vein thrombosis. 4. Air-fluid level in the right maxillary sinus, which is nonspecific but can be seen in the setting of acute sinusitis. These results were called by telephone at the time of interpretation on 01/10/2022 at 9:47 pm to provider [facility] , who verbally acknowledged these results. Electronically Signed   By: Merilyn Baba M.D.   On: 01/10/2022 21:47   CT ANGIO HEAD NECK W WO CM  Result Date: 01/10/2022 CLINICAL DATA:  Vertigo, central. Dizziness last night with nausea and vomiting. Patient awoke at 3 a.m. with similar symptoms. EXAM: CT ANGIOGRAPHY HEAD AND NECK TECHNIQUE: Multidetector CT imaging of the head and neck was performed using the standard protocol during bolus administration of intravenous contrast.  Multiplanar CT image reconstructions and MIPs were obtained to evaluate the vascular anatomy. Carotid stenosis measurements (when applicable) are obtained utilizing NASCET criteria, using the distal internal carotid diameter as the denominator. RADIATION DOSE REDUCTION: This exam was performed according to the departmental dose-optimization program which includes automated exposure control, adjustment of the mA and/or kV according to patient size and/or use of iterative reconstruction technique. CONTRAST:  5m OMNIPAQUE IOHEXOL 350 MG/ML SOLN COMPARISON:  CT head without contrast 01/10/2022 mild at 8:05 a.m. FINDINGS: CTA NECK FINDINGS Aortic arch: A common origin of the left common  carotid artery and innominate artery noted. No significant stenosis or atherosclerotic disease is present at the aortic arch. Right carotid system: The right common carotid artery is within normal limits. Mild atherosclerotic changes are present along the posterior aspect of the bifurcation without significant stenosis. Mild tortuosity is present the distal cervical right ICA without significant stenosis. Left carotid system: Left common carotid artery is within normal limits. Atherosclerotic changes are present at the bifurcation without significant stenosis. The cervical left ICA is normal. Vertebral arteries: Right vertebral artery is the dominant vessel. Both vertebral arteries originate from the subclavian arteries without significant stenosis. Skeleton: Multilevel degenerative changes are present cervical spine. No focal disc disease or stenosis is present. Other neck: Soft tissues the neck are otherwise unremarkable. Salivary glands are within normal limits. Thyroid is normal. No significant adenopathy is present. No focal mucosal or submucosal lesions are present. Upper chest: Mild dependent atelectasis is present. No nodule or mass lesion is present. Thoracic inlet is within normal limits. Review of the MIP images confirms the  above findings CTA HEAD FINDINGS Anterior circulation: The internal carotid arteries are within normal limits from high cervical segments through the scratched at the internal carotid arteries are within normal limits through the ICA termini bilaterally. The A1 and M1 segments are normal. The anterior communicating artery is patent. MCA bifurcations are within normal limits bilaterally. The ACA and MCA branch vessels are normal. Posterior circulation: The right vertebral artery dominant vessel. Right PICA origin is visualized and normal. Dominant left AICA vessel is present. Vertebrobasilar junction and basilar artery normal. Left posterior cerebral artery originates from basilar tip. The right posterior cerebral artery is of fetal type. Small right P1 segment is present. PCA branch vessels are within normal limits bilaterally. Venous sinuses: The dural sinuses are patent. The straight sinus and deep cerebral veins are intact. Cortical veins are within normal limits. No significant vascular malformation is evident. Anatomic variants: Fetal type right posterior cerebral artery. Review of the MIP images confirms the above findings IMPRESSION: 1. Mild atherosclerotic changes at the carotid bifurcations bilaterally without significant stenosis. 2. Normal variant CTA Circle of Willis without significant proximal stenosis, aneurysm, or branch vessel occlusion. Electronically Signed   By: San Morelle M.D.   On: 01/10/2022 14:06   CT Head Wo Contrast  Result Date: 01/10/2022 CLINICAL DATA:  Vertigo, peripheral Headache, new onset (Age >= 51y) EXAM: CT HEAD WITHOUT CONTRAST TECHNIQUE: Contiguous axial images were obtained from the base of the skull through the vertex without intravenous contrast. RADIATION DOSE REDUCTION: This exam was performed according to the departmental dose-optimization program which includes automated exposure control, adjustment of the mA and/or kV according to patient size and/or use  of iterative reconstruction technique. COMPARISON:  None Available. FINDINGS: Brain: No evidence of acute infarction, hemorrhage, hydrocephalus, extra-axial collection or mass lesion/mass effect. Vascular: No hyperdense vessel or unexpected calcification. Skull: Normal. Negative for fracture or focal lesion. Sinuses/Orbits: Mucoperiosteal thickening consistent with mild chronic pansinusitis. IMPRESSION: No acute intracranial process. Electronically Signed   By: Sammie Bench M.D.   On: 01/10/2022 08:38   DG Chest 2 View  Result Date: 01/10/2022 CLINICAL DATA:  dizziness EXAM: CHEST - 2 VIEW COMPARISON:  08/05/2016. FINDINGS: Cardiac silhouette is unremarkable. No pneumothorax or pleural effusion. The lungs are clear. Right hemidiaphragm is elevated. The visualized skeletal structures are unremarkable. IMPRESSION: Elevated right hemidiaphragm. Otherwise no acute cardiopulmonary process. Electronically Signed   By: Sammie Bench M.D.   On: 01/10/2022 08:33  EKG: Independently reviewed. Sinus rhythm, non-specific IVCD with QRS 115.   Assessment/Plan   1. Acute cerebellar stroke  - Acute infarcts involving right mid- and inferior cerebellum noted on MRI  - Continue cardiac monitoring and frequent neuro checks, keep NPO pending swallow eval, check echocardiogram, lipids, and A1c, consult PT/OT/SLP, permit HTN for now, and follow-up neurology recommendations regarding carotid imaging modality and antiplatelet   2. AKI  - SCr is 1.37 on admission, up from 0.81 in 2018  - Presumed to be acute in setting of N/V  - Renally-dose medications, hydrate with IVF, repeat chem panel in am    3. Hypertension  - Hold antihypertensives in acute-phase of ischemic CVA    4. Asthma; OSA  - Not in exacerbation   - Continue Breo, Singulair, as-needed albuterol, and CPAP qHS    DVT prophylaxis: Lovenox  Code Status: Full  Level of Care: Level of care: Progressive Family Communication: None present    Disposition Plan:  Patient is from: home  Anticipated d/c is to: TBD Anticipated d/c date is: 12/18 or 01/12/22  Patient currently: Pending stroke workup  Consults called: Neurology  Admission status: Observation     Vianne Bulls, MD [facility]  01/11/2022, 12:08 AM

## 2022-01-11 NOTE — Progress Notes (Addendum)
STROKE TEAM PROGRESS NOTE   INTERVAL HISTORY Patient was evaluated at bedside. His wife was present during the encounter. Patient continues to endorse headache, nausea. On evaluation there were no focal neurologic deficits, though patient states he tried to walk with occupational therapy and was unsteady when ambulating. MRI scan of the brain shows right PICA infarct with mild mass effect on fourth ventricle but no hydrocephalus.  CT angiogram of the brain and neck shows no large vessel stenosis or occlusion.  LDL cholesterol 111 mg percent. Vitals:   01/11/22 0400 01/11/22 0500 01/11/22 0600 01/11/22 0630  BP: (!) 150/86 (!) 141/76 131/75 131/75  Pulse: 96 76 67 67  Resp:    16  Temp:    98.1 F (36.7 C)  TempSrc:    Oral  SpO2: 99% 99% 98% 98%  Weight:      Height:       CBC:  Recent Labs  Lab 01/10/22 0631 01/11/22 0441  WBC 4.9 7.6  NEUTROABS 3.2  --   HGB 13.8 13.4  HCT 39.5 38.5*  MCV 83.7 84.8  PLT 192 664   Basic Metabolic Panel:  Recent Labs  Lab 01/10/22 0631 01/11/22 0441  NA 137 136  K 4.3 3.7  CL 104 103  CO2 24 21*  GLUCOSE 151* 121*  BUN 21 28*  CREATININE 1.37* 1.15  CALCIUM 8.8* 8.3*   Lipid Panel:  Recent Labs  Lab 01/11/22 0441  CHOL 159  TRIG 104  HDL 27*  CHOLHDL 5.9  VLDL 21  LDLCALC 111*   HgbA1c: No results for input(s): "HGBA1C" in the last 168 hours. Urine Drug Screen: No results for input(s): "LABOPIA", "COCAINSCRNUR", "LABBENZ", "AMPHETMU", "THCU", "LABBARB" in the last 168 hours.  Alcohol Level No results for input(s): "ETH" in the last 168 hours.  IMAGING past 24 hours MR BRAIN WO CONTRAST  Result Date: 01/10/2022 CLINICAL DATA:  Headache, vertigo EXAM: MRI HEAD WITHOUT CONTRAST MRV HEAD WITHOUT AND WITH CONTRAST TECHNIQUE: Multiplanar, multi-echo pulse sequences of the brain and surrounding structures were acquired without intravenous contrast. Angiographic images of the intracranial venous structures were acquired using MRV  technique without and with intravenous contrast. COMPARISON:  None Available. CONTRAST:  10 mL gadobutrol FINDINGS: MRI HEAD WITHOUT CONTRAST Brain: Restricted diffusion with ADC correlate in the right mid and inferior cerebellum (series 2, images 3-13), primarily the right PICA territory. This areas associated with mild edema and sulcal effacement. No definite effacement of the fourth ventricle. Focal susceptibility is noted in the expected location of the right PICA (series 7, image 15), concerning for thrombosis. No acute hemorrhage, mass, mass effect, or midline shift. No hemosiderin deposition to suggest remote infarct. Scattered T2 hyperintense signal in the periventricular white matter, likely the sequela of minimal cerebral volume is normal for age. Chronic small vessel ischemic disease. Vascular: Major arterial flow voids appear patent. As described above, there is susceptibility in the expected location of the right PICA, concerning for thrombosis (series 7, image 15). On postcontrast imaging, contrast is noted in the major intracranial arteries and venous sinuses. Skull and upper cervical spine: Normal marrow signal. Sinuses/Orbits: Mild mucosal thickening in the maxillary sinuses with air-fluid level in the right maxillary sinus. Additional mucosal thickening in the ethmoid air cells. The orbits are unremarkable. Other: Trace fluid in the left mastoid air cells. MR VENOGRAM WITHOUT CONTRAST There is no evidence of dural venous sinus or deep cerebral vein thrombosis. No dural venous sinus stenosis. IMPRESSION: 1. Acute infarct in the  right mid and inferior cerebellum, primarily in the right PICA territory, with mild edema and sulcal effacement but no definite effacement of the fourth ventricle. 2. Focal susceptibility in the expected location of the right PICA, concerning for thrombosis. 3. No evidence of dural venous sinus or deep cerebral vein thrombosis. 4. Air-fluid level in the right maxillary sinus,  which is nonspecific but can be seen in the setting of acute sinusitis. These results were called by telephone at the time of interpretation on 01/10/2022 at 9:47 pm to provider [facility] , who verbally acknowledged these results. Electronically Signed   By: Merilyn Baba M.D.   On: 01/10/2022 21:47   MR MRV HEAD W WO CONTRAST  Result Date: 01/10/2022 CLINICAL DATA:  Headache, vertigo EXAM: MRI HEAD WITHOUT CONTRAST MRV HEAD WITHOUT AND WITH CONTRAST TECHNIQUE: Multiplanar, multi-echo pulse sequences of the brain and surrounding structures were acquired without intravenous contrast. Angiographic images of the intracranial venous structures were acquired using MRV technique without and with intravenous contrast. COMPARISON:  None Available. CONTRAST:  10 mL gadobutrol FINDINGS: MRI HEAD WITHOUT CONTRAST Brain: Restricted diffusion with ADC correlate in the right mid and inferior cerebellum (series 2, images 3-13), primarily the right PICA territory. This areas associated with mild edema and sulcal effacement. No definite effacement of the fourth ventricle. Focal susceptibility is noted in the expected location of the right PICA (series 7, image 15), concerning for thrombosis. No acute hemorrhage, mass, mass effect, or midline shift. No hemosiderin deposition to suggest remote infarct. Scattered T2 hyperintense signal in the periventricular white matter, likely the sequela of minimal cerebral volume is normal for age. Chronic small vessel ischemic disease. Vascular: Major arterial flow voids appear patent. As described above, there is susceptibility in the expected location of the right PICA, concerning for thrombosis (series 7, image 15). On postcontrast imaging, contrast is noted in the major intracranial arteries and venous sinuses. Skull and upper cervical spine: Normal marrow signal. Sinuses/Orbits: Mild mucosal thickening in the maxillary sinuses with air-fluid level in the right maxillary sinus. Additional  mucosal thickening in the ethmoid air cells. The orbits are unremarkable. Other: Trace fluid in the left mastoid air cells. MR VENOGRAM WITHOUT CONTRAST There is no evidence of dural venous sinus or deep cerebral vein thrombosis. No dural venous sinus stenosis. IMPRESSION: 1. Acute infarct in the right mid and inferior cerebellum, primarily in the right PICA territory, with mild edema and sulcal effacement but no definite effacement of the fourth ventricle. 2. Focal susceptibility in the expected location of the right PICA, concerning for thrombosis. 3. No evidence of dural venous sinus or deep cerebral vein thrombosis. 4. Air-fluid level in the right maxillary sinus, which is nonspecific but can be seen in the setting of acute sinusitis. These results were called by telephone at the time of interpretation on 01/10/2022 at 9:47 pm to provider [facility] , who verbally acknowledged these results. Electronically Signed   By: Merilyn Baba M.D.   On: 01/10/2022 21:47   CT ANGIO HEAD NECK W WO CM  Result Date: 01/10/2022 CLINICAL DATA:  Vertigo, central. Dizziness last night with nausea and vomiting. Patient awoke at 3 a.m. with similar symptoms. EXAM: CT ANGIOGRAPHY HEAD AND NECK TECHNIQUE: Multidetector CT imaging of the head and neck was performed using the standard protocol during bolus administration of intravenous contrast. Multiplanar CT image reconstructions and MIPs were obtained to evaluate the vascular anatomy. Carotid stenosis measurements (when applicable) are obtained utilizing NASCET criteria, using the distal internal carotid  diameter as the denominator. RADIATION DOSE REDUCTION: This exam was performed according to the departmental dose-optimization program which includes automated exposure control, adjustment of the mA and/or kV according to patient size and/or use of iterative reconstruction technique. CONTRAST:  86m OMNIPAQUE IOHEXOL 350 MG/ML SOLN COMPARISON:  CT head without contrast 01/10/2022  mild at 8:05 a.m. FINDINGS: CTA NECK FINDINGS Aortic arch: A common origin of the left common carotid artery and innominate artery noted. No significant stenosis or atherosclerotic disease is present at the aortic arch. Right carotid system: The right common carotid artery is within normal limits. Mild atherosclerotic changes are present along the posterior aspect of the bifurcation without significant stenosis. Mild tortuosity is present the distal cervical right ICA without significant stenosis. Left carotid system: Left common carotid artery is within normal limits. Atherosclerotic changes are present at the bifurcation without significant stenosis. The cervical left ICA is normal. Vertebral arteries: Right vertebral artery is the dominant vessel. Both vertebral arteries originate from the subclavian arteries without significant stenosis. Skeleton: Multilevel degenerative changes are present cervical spine. No focal disc disease or stenosis is present. Other neck: Soft tissues the neck are otherwise unremarkable. Salivary glands are within normal limits. Thyroid is normal. No significant adenopathy is present. No focal mucosal or submucosal lesions are present. Upper chest: Mild dependent atelectasis is present. No nodule or mass lesion is present. Thoracic inlet is within normal limits. Review of the MIP images confirms the above findings CTA HEAD FINDINGS Anterior circulation: The internal carotid arteries are within normal limits from high cervical segments through the scratched at the internal carotid arteries are within normal limits through the ICA termini bilaterally. The A1 and M1 segments are normal. The anterior communicating artery is patent. MCA bifurcations are within normal limits bilaterally. The ACA and MCA branch vessels are normal. Posterior circulation: The right vertebral artery dominant vessel. Right PICA origin is visualized and normal. Dominant left AICA vessel is present. Vertebrobasilar  junction and basilar artery normal. Left posterior cerebral artery originates from basilar tip. The right posterior cerebral artery is of fetal type. Small right P1 segment is present. PCA branch vessels are within normal limits bilaterally. Venous sinuses: The dural sinuses are patent. The straight sinus and deep cerebral veins are intact. Cortical veins are within normal limits. No significant vascular malformation is evident. Anatomic variants: Fetal type right posterior cerebral artery. Review of the MIP images confirms the above findings IMPRESSION: 1. Mild atherosclerotic changes at the carotid bifurcations bilaterally without significant stenosis. 2. Normal variant CTA Circle of Willis without significant proximal stenosis, aneurysm, or branch vessel occlusion. Electronically Signed   By: CSan MorelleM.D.   On: 01/10/2022 14:06   CT Head Wo Contrast  Result Date: 01/10/2022 CLINICAL DATA:  Vertigo, peripheral Headache, new onset (Age >= 51y) EXAM: CT HEAD WITHOUT CONTRAST TECHNIQUE: Contiguous axial images were obtained from the base of the skull through the vertex without intravenous contrast. RADIATION DOSE REDUCTION: This exam was performed according to the departmental dose-optimization program which includes automated exposure control, adjustment of the mA and/or kV according to patient size and/or use of iterative reconstruction technique. COMPARISON:  None Available. FINDINGS: Brain: No evidence of acute infarction, hemorrhage, hydrocephalus, extra-axial collection or mass lesion/mass effect. Vascular: No hyperdense vessel or unexpected calcification. Skull: Normal. Negative for fracture or focal lesion. Sinuses/Orbits: Mucoperiosteal thickening consistent with mild chronic pansinusitis. IMPRESSION: No acute intracranial process. Electronically Signed   By: JSammie BenchM.D.   On: 01/10/2022 08:38  DG Chest 2 View  Result Date: 01/10/2022 CLINICAL DATA:  dizziness EXAM: CHEST -  2 VIEW COMPARISON:  08/05/2016. FINDINGS: Cardiac silhouette is unremarkable. No pneumothorax or pleural effusion. The lungs are clear. Right hemidiaphragm is elevated. The visualized skeletal structures are unremarkable. IMPRESSION: Elevated right hemidiaphragm. Otherwise no acute cardiopulmonary process. Electronically Signed   By: Sammie Bench M.D.   On: 01/10/2022 08:33    PHYSICAL EXAM General: well-appearing and in no acute distress HEENT: normocephalic and atraumatic Cardiovascular: regular rate Respiratory: normal respiratory effort and on RA Gastrointestinal: non-tender and non-distended Extremities: moving all extremities spontaneously  Mental Status: Calel P Tillis is alert; he is oriented to person, place, time, and situation. Speech was clear and fluent without evidence of aphasia. He was able to follow 3 step commands without difficulty.  Cranial Nerves: II:  Visual fields grossly normal III,IV, VI: no ptosis, extra-ocular motions intact bilaterally.  Mild saccadic dysmetria on lateral gaze. V,VII: smile symmetric, facial light touch sensation intact bilaterally XII: midline tongue extension without atrophy and without fasciculations  Motor: Right : Upper extremity   5/5 full power  Lower extremity   5/5 full power  Left: Upper extremity   5/5 full power Lower extremity   5/5 full power  Tone and bulk: normal tone throughout; no atrophy noted  Sensory: sensation to light touch intact throughout bilaterally  Cerebellar: Finger-to-nose test normal, heel-to-shin test normal  Gait: not observed during encounter, though patient states he was unsteady when ambulating with OT   NIHSS 0. Premorbid modified Rankin scale 0  ASSESSMENT/PLAN Tyler [name] is a 66 y.o. male past medical history of hypertension, sleep apnea, allergies, presenting to the emergency room for evaluation of symptoms of headache and dizzy spells.  Difficult to ascertain last known well  because he complains of a headache that has been going on for 2 to 3 days but had sudden onset of dizzy spells that started Saturday evening around 7 PM.  Before that he was only having mild headache and no other neurological symptoms. MRI of the head showed a large acute ischemic infarct in the right mid and inferior cerebellum primarily in the right PICA territory with mild edema and sulcal effacement but no definitive fourth ventricular effacement or hydrocephalus.  R PICA Stroke-cryptogenic etiology Code Stroke CT head no acute intracranial process.    CTA head & neck showed mild atherosclerotic changes at the carotid bifurcations bilaterally without significant stenosis. Normal variant CTA Circle of Willis without significant proximal stenosis, aneurysm, or branch vessel occlusion. MRV W WO contrast showed acute infarct in the right mid and inferior cerebellum, primarily in the right PICA territory, with mild edema and sulcal effacement but no definite effacement of the fourth ventricle. Focal susceptibility in the expected location of the right PICA, concerning for thrombosis. No evidence of dural venous sinus or deep cerebral vein thrombosis. Air-fluid level in the right maxillary sinus, which is nonspecific but can be seen in the setting of acute sinusitis. Repeat CT head for 01/12/2022 AM scheduled 2D Echo EF 55-60%. No LVH LDL 111 HgbA1c pending Consider loop recorder at discharge VTE prophylaxis - enoxaparin    Diet   Diet NPO time specified   No antithrombotic prior to admission, now on aspirin 81 mg daily. Aspirin 81 mg and clopidogrel 75 mg daily for 21 days, the aspirin 81 mg alone daily indefinitely Therapy recommendations:  HH therapy Disposition:  home pending medical clearance, stroke workup  Hypertension, chronic Home meds:  hydrochlorothiazide, olmesartan, tamsulosin Stable Permissive hypertension (OK if < 220/120) but gradually normalize in 5-7 days Long-term BP goal  normotensive  Hyperlipidemia Home meds:  none LDL 111, goal < 70 Add atorvastatin 40 mg  High intensity statin indicated, continue statin at discharge  Other Stroke Risk Factors Advanced Age >/= 39  Obesity, Body mass index is 31.57 kg/m., BMI >/= 30 associated with increased stroke risk, recommend weight loss, diet and exercise as appropriate  Obstructive sleep apnea, on CPAP at home  Other Active Problems per primary team OSA AKI  Hospital day # 0   STROKE MD NOTE :  I have personally obtained history,examined this patient, reviewed notes, independently viewed imaging studies, participated in medical decision making and plan of care.ROS completed by me personally and pertinent positives fully documented  I have made any additions or clarifications directly to the above note. Agree with note above.  Patient presented with 2 days of nausea dizziness gait ataxia with MRI showing right PICA infarct of cryptogenic etiology.  Recommend close neurological monitoring and repeat CT scan of the head tomorrow morning.  Aspirin and Plavix for 3 weeks followed by aspirin alone.  Aggressive risk factor modification.  Continue ongoing stroke workup.  Long discussion with the patient and wife and daughter and answered questions. Patient also appears interested in [place]  stroke prevention study ( Milvexian and stamdard antiplatelet therapu vesus standard antiplatelet therapy alone ).  He was given written information about the study to review and discuss with his family and decide Greater than 50% time during this 50-minute visit was spent on counseling and coordination of care about his cryptogenic stroke and discussion of stroke evaluation and treatment questions.  Discussed with Dr. Tyrell Antonio.  Antony Contras, MD Medical Director [facility] Pager: [phone] 01/11/2022 2:32 PM  To contact Stroke Continuity provider, please refer to [email or web address] After hours, contact General  Neurology

## 2022-01-11 NOTE — Evaluation (Signed)
Physical Therapy Evaluation Patient Details Name: Tyler [name] MRN: [id number] DOB: 02/10/1955 Today's Date: 01/11/2022  History of Present Illness  Pt is a 66 y/o male presenting on 12/17 with headache and dizziness. 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  PMH includes: arthritis, HTN, asthma.  Clinical Impression  Pt admitted with above diagnosis. At baseline, pt independent.  He has home support.  Today, pt demonstrates good strength, sensation, and coordination.  His main deficit from CVA is dizziness and balance.  Pt reports has improved since admission.  He required min guard for transfers and to ambulate 100' with RW.  Cued for slow transitions, focus points, and segmental turns.  Provided with HEP of habituation exercises.  Will benefit from acute PT and recommend outpt PT at d/c.  Pt currently with functional limitations due to the deficits listed below (see PT Problem List). Pt will benefit from skilled PT to increase their independence and safety with mobility to allow discharge to the venue listed below.          Recommendations for follow up therapy are one component of a multi-disciplinary discharge planning process, led by the attending physician.  Recommendations may be updated based on patient status, additional functional criteria and insurance authorization.  Follow Up Recommendations Outpatient PT (pt [facility])      Assistance Recommended at Discharge Intermittent Supervision/Assistance  Patient can return home with the following  A little help with walking and/or transfers;A little help with bathing/dressing/bathroom;Assistance with cooking/housework;Help with stairs or ramp for entrance    Equipment Recommendations Rolling walker (2 wheels)  Recommendations for Other Services       Functional Status Assessment Patient has had a recent decline in their functional status and demonstrates the ability to make  significant improvements in function in a reasonable and predictable amount of time.     Precautions / Restrictions Precautions Precautions: Fall      Mobility  Bed Mobility Overal bed mobility: Needs Assistance Bed Mobility: Supine to Sit, Sit to Supine     Supine to sit: Min guard Sit to supine: Min guard   General bed mobility comments: cues for slow turns, segmental turns; min guard for safety but no assist needed    Transfers Overall transfer level: Needs assistance Equipment used: Rolling walker (2 wheels) Transfers: Sit to/from Stand Sit to Stand: Min guard           General transfer comment: Min guard safety but no assist needed; cues for slow transition and focus point    Ambulation/Gait Ambulation/Gait assistance: Min guard Gait Distance (Feet): 100 Feet Assistive device: Rolling walker (2 wheels) Gait Pattern/deviations: Step-through pattern Gait velocity: decreased     General Gait Details: Step through pattern with cautious speed but no LOB; cues for segmental turns  Stairs            Wheelchair Mobility    Modified Rankin (Stroke Patients Only) Modified Rankin (Stroke Patients Only) Pre-Morbid Rankin Score: No symptoms Modified Rankin: Moderate disability     Balance Overall balance assessment: Needs assistance Sitting-balance support: No upper extremity supported, Feet supported Sitting balance-Leahy Scale: Good     Standing balance support: No upper extremity supported, Bilateral upper extremity supported Standing balance-Leahy Scale: Fair Standing balance comment: Feels better with bil UE support and RW to ambulate but did stand at sink and wash hands without support  Pertinent Vitals/Pain Pain Assessment Pain Assessment: No/denies pain    Home Living Family/patient expects to be discharged to:: Private residence Living Arrangements: Spouse/significant other Available Help at  Discharge: Family Type of Home: House Home Access: Stairs to enter Entrance Stairs-Rails: None Entrance Stairs-Number of Steps: 2-3 garage, 4-5 front with rail Alternate Level Stairs-Number of Steps: flight Home Layout: Two level;1/2 bath on main level;Bed/bath upstairs Home Equipment: Other (comment);Grab bars - tub/shower Additional Comments: hiking stick    Prior Function Prior Level of Function : Independent/Modified Independent;Driving             Mobility Comments: using hiking stick on walks ("more to keep dogs away if needed"), but not needed for mobility ADLs Comments: retired, 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, hospital; driving     Journalist, newspaper   Dominant Hand: Left    Extremity/Trunk Assessment   Upper Extremity Assessment Upper Extremity Assessment: Overall WFL for tasks assessed    Lower Extremity Assessment Lower Extremity Assessment: Overall WFL for tasks assessed (ROM normal, MMT 5/5, coordination and sensation normla)    Cervical / Trunk Assessment Cervical / Trunk Assessment: Normal  Communication   Communication: No difficulties  Cognition Arousal/Alertness: Awake/alert Behavior During Therapy: WFL for tasks assessed/performed Overall Cognitive Status: Within Functional Limits for tasks assessed                                          General Comments General comments (skin integrity, edema, etc.): VSS; will provide with habituation HEP    Exercises     Assessment/Plan    PT Assessment Patient needs continued PT services  PT Problem List Decreased knowledge of use of DME;Decreased activity tolerance;Decreased balance       PT Treatment Interventions DME instruction;Balance training;Gait training;Neuromuscular re-education;Other (comment);Stair training;Functional mobility training;Patient/family education;Therapeutic activities;Therapeutic exercise (habituation HEP)    PT Goals (Current goals can be found in the Care Plan section)  Acute  Rehab PT Goals Patient Stated Goal: return home PT Goal Formulation: With patient/family Time For Goal Achievement: 01/25/22 Potential to Achieve Goals: Good    Frequency Min 4X/week     Co-evaluation               AM-PAC PT "6 Clicks" Mobility  Outcome Measure Help needed turning from your back to your side while in a flat bed without using bedrails?: None Help needed moving from lying on your back to sitting on the side of a flat bed without using bedrails?: A Little Help needed moving to and from a bed to a chair (including a wheelchair)?: A Little Help needed standing up from a chair using your arms (e.g., wheelchair or bedside chair)?: A Little Help needed to walk in hospital room?: A Little Help needed climbing 3-5 steps with a railing? : A Little 6 Click Score: 19    End of Session Equipment Utilized During Treatment: Gait belt Activity Tolerance: Patient tolerated treatment well Patient left: in bed;with call bell/phone within reach Nurse Communication: Mobility status PT Visit Diagnosis: Dizziness and giddiness (R42);Other abnormalities of gait and mobility (R26.89)    Time: 3532-9924 PT Time Calculation (min) (ACUTE ONLY): 26 min   Charges:   PT Evaluation $PT Eval Low Complexity: 1 Low PT Treatments $Gait Training: 8-22 mins        Abran Richard, PT [facility] [phone]   Karlton Lemon 01/11/2022, 2:01  PM

## 2022-01-11 NOTE — Progress Notes (Signed)
RT placed patient on CPAP HS. 2L O2 bleed in needed. Patient tolerating well at this time. 

## 2022-01-11 NOTE — Progress Notes (Signed)
  [place] STUDY NOTE  Patient is participating in [facility] stroke prevention study ( standard of care antiplatelte therapy plus Milvexian-factor 11 inhibitor versus standard of care antiplatelet therapy plus placebo ).  Patient was given study material to review and informed consent form at 11:50 AM.  Patient was advised to take as much time as she wanted to review the consent form and encouraged to ask questions which were answered.  Patient's wife was also present at the bedside and were also encouraged to review the material and ask questions.  It was made clear to the patient that study participation is voluntary and patient will still get the same excellent standard of care irrespective of whether she chooses to participate in the study or not.  The benefits of participation in the study as well as the risk involved including but not limited to bleeding as well as alternatives to not participating in the study were clearly discussed with the patient and family who expressed understanding.  The patient was clearly informed that patient has no obligation to's stay in the study for the entire duration and is free to discontinue study medication or study participation if she is dissatisfied at any time in the future.  The research study office visit scheduled was explained to the patient and study obligations were clearly explained as well.  Patient voiced understanding and willingness to participate in the study.  The study inclusion exclusion criteria reviewed by me personally as well as study coordinator and verified that patient met all criteria.  Patient signed informed consent with study coordinator in my presence and 3 56PM.  The hospital research pharmacy was notified in advance about potential patient participation and after patient randomization and patient received first dose of medication before discharge and she was discharged home with the study medication bottle and instructed to call [place]  research office with any questions.  No study specific procedure was done prior to patient signing informed consent form.  A copy of informed consent form and patient [place] of Rights signed by the patient was given to her.  Antony Contras, MD

## 2022-01-11 NOTE — ED Notes (Signed)
Dr. Rory Percy at bedside assessing patient

## 2022-01-12 ENCOUNTER — Encounter (HOSPITAL_COMMUNITY)
Admission: EM | Disposition: A | Discharge status: Home / Self Care | Payer: Self-pay | Source: Home / Self Care | Attending: Emergency Medicine

## 2022-01-12 ENCOUNTER — Observation Stay (HOSPITAL_COMMUNITY): Payer: Medicare PPO

## 2022-01-12 DIAGNOSIS — R42 Dizziness and giddiness: Secondary | ICD-10-CM | POA: Diagnosis not present

## 2022-01-12 DIAGNOSIS — I639 Cerebral infarction, unspecified: Secondary | ICD-10-CM | POA: Diagnosis not present

## 2022-01-12 HISTORY — PX: LOOP RECORDER INSERTION: EP1214

## 2022-01-12 LAB — BASIC METABOLIC PANEL
Anion gap: 10 (ref 5–15)
BUN: 23 mg/dL (ref 8–23)
CO2: 23 mmol/L (ref 22–32)
Calcium: 8.2 mg/dL — ABNORMAL LOW (ref 8.9–10.3)
Chloride: 104 mmol/L (ref 98–111)
Creatinine, Ser: 1.1 mg/dL (ref 0.61–1.24)
GFR, Estimated: >60 mL/min (ref >60)
Glucose, Bld: 100 mg/dL — ABNORMAL HIGH (ref 70–99)
Potassium: 3.7 mmol/L (ref 3.5–5.1)
Sodium: 137 mmol/L (ref 135–145)

## 2022-01-12 LAB — HEMOGLOBIN A1C
Hgb A1c MFr Bld: 6.1 % — ABNORMAL HIGH (ref 4.8–5.6)
Mean Plasma Glucose: 128 mg/dL

## 2022-01-12 SURGERY — LOOP RECORDER INSERTION

## 2022-01-12 MED ORDER — ATORVASTATIN CALCIUM 40 MG PO TABS: 40.0000 mg | ORAL_TABLET | Freq: Every day | ORAL | 3 refills | Status: DC

## 2022-01-12 MED ORDER — LIDOCAINE-EPINEPHRINE 1 %-1:100000 IJ SOLN
INTRAMUSCULAR | Status: AC
  Filled 2022-01-12: qty 1

## 2022-01-12 MED ORDER — STUDY - LIBREXIA-STROKE - MILVEXIAN 25 MG OR PLACEBO TABLET (PI-SETHI): 1.0000 | ORAL_TABLET | Freq: Two times a day (BID) | ORAL | 0 refills | Status: DC

## 2022-01-12 MED ORDER — CLOPIDOGREL BISULFATE 75 MG PO TABS: 75.0000 mg | ORAL_TABLET | Freq: Every day | ORAL | 0 refills | Status: AC

## 2022-01-12 MED ORDER — ASPIRIN 81 MG PO TBEC: 81.0000 mg | DELAYED_RELEASE_TABLET | Freq: Every day | ORAL | 12 refills | Status: AC

## 2022-01-12 MED ORDER — LIDOCAINE-EPINEPHRINE 1 %-1:100000 IJ SOLN
INTRAMUSCULAR | Status: DC | PRN
  Administered 2022-01-12: 10 mL

## 2022-01-12 SURGICAL SUPPLY — 2 items
PACK LOOP INSERTION (CUSTOM PROCEDURE TRAY) ×2 IMPLANT
SYSTEM MONITOR REVEAL LINQ II (Prosthesis & Implant Heart) IMPLANT

## 2022-01-12 NOTE — Care Management Obs Status (Signed)
[place] NOTIFICATION   Patient Details  Name: Tyler [name] MRN: [id number] Date of Birth: [birth year]   Medicare Observation Status Notification Given:  Yes    Pollie Friar, RN 01/12/2022, 11:10 AM

## 2022-01-12 NOTE — Progress Notes (Signed)
STROKE TEAM PROGRESS NOTE   INTERVAL HISTORY Patient is sitting up comfortably in the bedside chair.  He did have some mild headache last night but this morning seems quite comfortable.  CT head this morning shows slightly increased cytotoxic edema partial effacement of fourth ventricle but no hydrocephalus..  Patient states he did much better when he walked today and was less off balance.  Telemetry monitoring does not show any paroxysmal A-fib. Vitals:   01/12/22 0320 01/12/22 0744 01/12/22 0744 01/12/22 1113  BP: 135/77 133/78 133/78 135/84  Pulse: 73 64 64 71  Resp: '20 17 17 18  '$ Temp: 98.1 F (36.7 C) 98.5 F (36.9 C) 98.5 F (36.9 C) 98 F (36.7 C)  TempSrc: Oral Oral Oral Oral  SpO2: 95% 96% 96% 95%  Weight:      Height:       CBC:  Recent Labs  Lab 01/10/22 0631 01/11/22 0441  WBC 4.9 7.6  NEUTROABS 3.2  --   HGB 13.8 13.4  HCT 39.5 38.5*  MCV 83.7 84.8  PLT 192 737   Basic Metabolic Panel:  Recent Labs  Lab 01/11/22 0441 01/12/22 0654  NA 136 137  K 3.7 3.7  CL 103 104  CO2 21* 23  GLUCOSE 121* 100*  BUN 28* 23  CREATININE 1.15 1.10  CALCIUM 8.3* 8.2*   Lipid Panel:  Recent Labs  Lab 01/11/22 0441  CHOL 159  TRIG 104  HDL 27*  CHOLHDL 5.9  VLDL 21  LDLCALC 111*   HgbA1c:  Recent Labs  Lab 01/11/22 0441  HGBA1C 6.1*   Urine Drug Screen: No results for input(s): "LABOPIA", "COCAINSCRNUR", "LABBENZ", "AMPHETMU", "THCU", "LABBARB" in the last 168 hours.  Alcohol Level No results for input(s): "ETH" in the last 168 hours.  IMAGING past 24 hours CT HEAD WO CONTRAST (5MM)  Result Date: 01/12/2022 CLINICAL DATA:  66 year old male with dizziness. Right PICA infarct. EXAM: CT HEAD WITHOUT CONTRAST TECHNIQUE: Contiguous axial images were obtained from the base of the skull through the vertex without intravenous contrast. RADIATION DOSE REDUCTION: This exam was performed according to the departmental dose-optimization program which includes automated  exposure control, adjustment of the mA and/or kV according to patient size and/or use of iterative reconstruction technique. COMPARISON:  Brain MRI, CTA head and neck, head CT 01/10/2022. FINDINGS: Brain: Confluent right PICA cerebellar infarct now plainly visible by CT. Right cerebellar Cytotoxic edema with no hemorrhagic transformation. No brainstem edema identified. Mild to moderate mass effect on the 4th ventricle (series 2, image 9 versus series 3, image 102 days ago). But lateral and 3rd ventricle size remains within normal limits. No transependymal edema. Other basilar cisterns remain patent. No supratentorial mass effect or midline shift. Supratentorial gray-white differentiation is stable and within normal limits. Vascular: Mild Calcified atherosclerosis at the skull base. Skull: Negative. Sinuses/Orbits: Scattered bubbly opacity and mild mucosal thickening in the paranasal sinuses is stable. Tympanic cavities and mastoids remain clear. Other: Visualized orbits and scalp soft tissues are within normal limits. IMPRESSION: 1. Expected CT appearance of confluent Right PICA infarct. No hemorrhagic transformation. Mass effect on the 4th ventricle, but no ventriculomegaly and basilar cisterns remain patent. 2. No new intracranial abnormality. Electronically Signed   By: Tyler [name] M.D.   On: 01/12/2022 07:26    PHYSICAL EXAM General: well-appearing and in no acute distress HEENT: normocephalic and atraumatic Cardiovascular: regular rate Respiratory: normal respiratory effort and on RA Gastrointestinal: non-tender and non-distended Extremities: moving all extremities spontaneously  Mental  Status: Tyler [name] is alert; he is oriented to person, place, time, and situation. Speech was clear and fluent without evidence of aphasia. He was able to follow 3 step commands without difficulty.  Cranial Nerves: II:  Visual fields grossly normal III,IV, VI: no ptosis, extra-ocular motions intact  bilaterally.  Mild saccadic dysmetria on lateral gaze. V,VII: smile symmetric, facial light touch sensation intact bilaterally XII: midline tongue extension without atrophy and without fasciculations  Motor: Right : Upper extremity   5/5 full power  Lower extremity   5/5 full power  Left: Upper extremity   5/5 full power Lower extremity   5/5 full power  Tone and bulk: normal tone throughout; no atrophy noted  Sensory: sensation to light touch intact throughout bilaterally  Cerebellar: Finger-to-nose test normal, heel-to-shin test normal  Gait: not observed during encounter, though patient states he was unsteady when ambulating with OT   NIHSS 0. Premorbid modified Rankin scale 0  ASSESSMENT/PLAN Tyler [name] is a 66 y.o. male past medical history of hypertension, sleep apnea, allergies, presenting to the emergency room for evaluation of symptoms of headache and dizzy spells.  Difficult to ascertain last known well because he complains of a headache that has been going on for 2 to 3 days but had sudden onset of dizzy spells that started Saturday evening around 7 PM.  Before that he was only having mild headache and no other neurological symptoms. MRI of the head showed a large acute ischemic infarct in the right mid and inferior cerebellum primarily in the right PICA territory with mild edema and sulcal effacement but no definitive fourth ventricular effacement or hydrocephalus.  R PICA Stroke-cryptogenic etiology Code Stroke CT head no acute intracranial process.    CTA head & neck showed mild atherosclerotic changes at the carotid bifurcations bilaterally without significant stenosis. Normal variant CTA Circle of Willis without significant proximal stenosis, aneurysm, or branch vessel occlusion. MRV W WO contrast showed acute infarct in the right mid and inferior cerebellum, primarily in the right PICA territory, with mild edema and sulcal effacement but no definite effacement  of the fourth ventricle. Focal susceptibility in the expected location of the right PICA, concerning for thrombosis. No evidence of dural venous sinus or deep cerebral vein thrombosis. Air-fluid level in the right maxillary sinus, which is nonspecific but can be seen in the setting of acute sinusitis. Repeat CT head for 01/12/2022 AM scheduled 2D Echo EF 55-60%. No LVH LDL 111 HgbA1c 6.1 Recommend loop recorder at discharge VTE prophylaxis - enoxaparin    Diet   Diet Heart Room service appropriate? Yes; Fluid consistency: Thin   No antithrombotic prior to admission, now on aspirin 81 mg daily. Aspirin 81 mg and clopidogrel 75 mg daily for 21 days, the aspirin 81 mg alone daily indefinitely Therapy recommendations:  HH therapy Disposition:  home pending medical clearance, stroke workup  Hypertension, chronic Home meds:  hydrochlorothiazide, olmesartan, tamsulosin Stable Permissive hypertension (OK if < 220/120) but gradually normalize in 5-7 days Long-term BP goal normotensive  Hyperlipidemia Home meds:  none LDL 111, goal < 70 Add atorvastatin 40 mg  High intensity statin indicated, continue statin at discharge  Other Stroke Risk Factors Advanced Age >/= 42  Obesity, Body mass index is 31.57 kg/m., BMI >/= 30 associated with increased stroke risk, recommend weight loss, diet and exercise as appropriate  Obstructive sleep apnea, on CPAP at home  Other Active Problems per primary team OSA AKI  Hospital day # 0  Patient presented with 2 days of nausea dizziness gait ataxia with MRI showing right PICA infarct of cryptogenic etiology.  Recommend  Aspirin and Plavix for 3 weeks followed by aspirin alone.  Aggressive risk factor modification.  .  Loop recorder prior to discharge for paroxysmal A-fib..  Long discussion with the patient  and answered questions.  Patient is participating in [place]  stroke prevention study ( [place] and [place] antiplatelet therapu vesus standard  antiplatelet therapy alone ).  Likely discharge home today after loop recorder.  Follow-up as an outpatient in the clinic as per stroke study protocol.  Discussed with Dr. Tyrell Antonio and EP nurse practitioner greater than 50% time during this 35-minute visit was spent on counseling and coordination of care about his cryptogenic stroke and discussion of stroke evaluation and treatment questions.     Antony Contras, MD Medical Director [facility] Pager: [phone] 01/12/2022 1:01 PM  To contact Stroke Continuity provider, please refer to [email or web address] After hours, contact General Neurology

## 2022-01-12 NOTE — Progress Notes (Signed)
Physical Therapy Treatment Patient Details Name: Tyler [name] MRN: [id number] DOB: September 03, 1955 Today's Date: 01/12/2022   History of Present Illness Pt is a 66 y/o male presenting on 12/17 with headache and dizziness. 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  PMH includes: arthritis, HTN, asthma.    PT Comments    Pt greeted in bed and agreeable to session with continued progress towards acute goals. Pt continues to be limited by impaired balance/postural reactions, global fatigue, headache and dizziness. Pt completing bed mobility at supervision level and transfers sit<>stand with min guard for safety. Pt continues to endorse preference of gait with RW, demonstrating gait in room with RW at supervision level, however pt completing bout without AD at min guard level with no overt LOB noted. Pt with small wobbles when changing speeds and turning quickly however pt able to self correct in all instances. Educated pt re; importance of continued mobility, activity recommendations, safety with mobility, and benefits of time up OOB with pt verbalizing understanding. Pt continues to benefit from skilled PT services to progress toward functional mobility goals.    Recommendations for follow up therapy are one component of a multi-disciplinary discharge planning process, led by the attending physician.  Recommendations may be updated based on patient status, additional functional criteria and insurance authorization.  Follow Up Recommendations  Outpatient PT (pt reports familiar with [facility])     Assistance Recommended at Discharge Intermittent Supervision/Assistance  Patient can return home with the following A little help with walking and/or transfers;A little help with bathing/dressing/bathroom;Assistance with cooking/housework;Help with stairs or ramp for entrance   Equipment Recommendations  Rolling walker (2 wheels)    Recommendations for Other Services        Precautions / Restrictions Precautions Precautions: Fall Precaution Comments: dizziness Restrictions Weight Bearing Restrictions: No     Mobility  Bed Mobility Overal bed mobility: Needs Assistance Bed Mobility: Supine to Sit, Sit to Supine     Supine to sit: Supervision          Transfers Overall transfer level: Needs assistance Equipment used: Rolling walker (2 wheels), None Transfers: Sit to/from Stand Sit to Stand: Min guard           General transfer comment: Min guard safety but no assist needed; cues for slow transition and focus point    Ambulation/Gait Ambulation/Gait assistance: Min guard, Supervision Gait Distance (Feet): 400 Feet Assistive device: None Gait Pattern/deviations: Step-through pattern Gait velocity: decreased     General Gait Details: Step through pattern with cautious speed but no LOB; focus on stop/starts, turns, changing direction, changing speed   Stairs Stairs: Yes Stairs assistance: Min guard Stair Management: Two rails, Alternating pattern Number of Stairs: 4 General stair comments: up/down without fault, min guard for safety   Wheelchair Mobility    Modified Rankin (Stroke Patients Only) Modified Rankin (Stroke Patients Only) Pre-Morbid Rankin Score: No symptoms Modified Rankin: Moderate disability     Balance Overall balance assessment: Needs assistance Sitting-balance support: No upper extremity supported, Feet supported Sitting balance-Leahy Scale: Good     Standing balance support: No upper extremity supported, Bilateral upper extremity supported Standing balance-Leahy Scale: Fair Standing balance comment: Feels better with bil UE support                            Cognition Arousal/Alertness: Awake/alert Behavior During Therapy: WFL for tasks assessed/performed Overall Cognitive Status: Within Functional Limits for tasks assessed  General  Comments: pt very aware of deficits and need for increased assistance        Exercises      General Comments General comments (skin integrity, edema, etc.): VSS on RA      Pertinent Vitals/Pain Pain Assessment Pain Assessment: Faces Faces Pain Scale: Hurts a little bit Pain Location: R sided headache Pain Descriptors / Indicators: Headache    Home Living                          Prior Function            PT Goals (current goals can now be found in the care plan section) Acute Rehab PT Goals Patient Stated Goal: return home PT Goal Formulation: With patient/family Time For Goal Achievement: 01/25/22    Frequency    Min 4X/week      PT Plan      Co-evaluation              AM-PAC PT "6 Clicks" Mobility   Outcome Measure  Help needed turning from your back to your side while in a flat bed without using bedrails?: None Help needed moving from lying on your back to sitting on the side of a flat bed without using bedrails?: A Little Help needed moving to and from a bed to a chair (including a wheelchair)?: A Little Help needed standing up from a chair using your arms (e.g., wheelchair or bedside chair)?: A Little Help needed to walk in hospital room?: A Little Help needed climbing 3-5 steps with a railing? : A Little 6 Click Score: 19    End of Session Equipment Utilized During Treatment: Gait belt Activity Tolerance: Patient tolerated treatment well Patient left: with call bell/phone within reach;in chair Nurse Communication: Mobility status PT Visit Diagnosis: Dizziness and giddiness (R42);Other abnormalities of gait and mobility (R26.89)     Time: 3662-9476 PT Time Calculation (min) (ACUTE ONLY): 27 min  Charges:  $Gait Training: 23-37 mins                     Rande Roylance R. PTA Acute Rehabilitation Services Office: [place] 01/12/2022, 11:14 AM

## 2022-01-12 NOTE — Consult Note (Addendum)
ELECTROPHYSIOLOGY CONSULT NOTE  Patient ID: Tyler [name] MRN: [id number], DOB/AGE: 09-14-1955   Admit date: 01/10/2022 Date of Consult: 01/12/2022  Primary Physician: Haywood Pao, MD Primary Cardiologist: none Reason for Consultation: Cryptogenic stroke ; recommendations regarding Implantable Loop Recorder, requested by Dr. Leonie Man  History of Present Illness Tyler [name] was admitted on 01/10/2022 with HA/dizziness, found with stroke.    PMHx includes: HTN, sleep apnea  Neurology notes: R PICA Stroke-cryptogenic etiology .  he has undergone workup for stroke including echocardiogram and carotid angio.  The patient has been monitored on telemetry which has demonstrated sinus rhythm with no arrhythmias.   Neurology has deferred TEE Echocardiogram this admission demonstrated .    1. Left ventricular ejection fraction, by estimation, is 55 to 60%. The  left ventricle has normal function. The left ventricle has no regional  wall motion abnormalities. Left ventricular diastolic parameters were  normal.   2. Right ventricular systolic function is normal. The right ventricular  size is normal. Tricuspid regurgitation signal is inadequate for assessing  PA pressure.   3. The mitral valve is degenerative. Trivial mitral valve regurgitation.  No evidence of mitral stenosis.   4. The aortic valve is tricuspid. Aortic valve regurgitation is not  visualized. No aortic stenosis is present.   5. The inferior vena cava is dilated in size with >50% respiratory  variability, suggesting right atrial pressure of 8 mmHg.   Conclusion(s)/Recommendation(s): No intracardiac source of embolism  detected on this transthoracic study. Consider a transesophageal  echocardiogram to exclude cardiac source of embolism if clinically  indicated.   Lab work is reviewed.  Prior to admission, the patient denies chest pain, shortness of breath, dizziness, palpitations, or syncope.  They  are recovering from their stroke with plans to home at discharge.    Past Medical History:  Diagnosis Date   Arthritis    Asthma    Complication of anesthesia 08/17/2016   pt had urinary retention following last sinus surgery that required him to go to the ED to be in and out [id number]   History of hiatal hernia    Hypertension    Sleep apnea      Surgical History:  Past Surgical History:  Procedure Laterality Date   NASAL SINUS SURGERY  2002   ROTATOR CUFF REPAIR     SEPTOPLASTY WITH ETHMOIDECTOMY, AND MAXILLARY ANTROSTOMY N/A 08/19/2016   Procedure: SEPTOPLASTY WITH ETHMOIDECTOMY, AND MAXILLARY ANTROSTOMY;  Surgeon: [name], MD;  Location: [facility];  Service: ENT;  Laterality: N/A;   TURBINATE REDUCTION N/A 08/19/2016   Procedure: [place];  Surgeon: [name], MD;  Location: [place];  Service: ENT;  Laterality: N/A;   WISDOM TOOTH EXTRACTION       Medications Prior to Admission  Medication Sig Dispense Refill Last Dose   ALBUTEROL IN Inhale 1-2 puffs into the lungs every 4 (four) hours as needed (shortness of breath).   Past Month   amLODipine (NORVASC) 10 MG tablet Take 10 mg by mouth daily.   Past Week   Calcium-Magnesium-Zinc (CAL-MAG-ZINC PO) Take 2 tablets by mouth daily.   Past Week   Cholecalciferol (VITAMIN D-3 PO) Take 1 tablet by mouth daily.   Past Week   Cyanocobalamin (B-12 PO) Take 1 tablet by mouth daily.   Past Week   ferrous sulfate 325 (65 FE) MG tablet Take 325 mg by mouth daily.    Past Week   fluticasone (FLONASE) 50 MCG/ACT nasal spray  Place 2 sprays into both nostrils daily as needed for allergies or rhinitis.   Past Month   fluticasone furoate-vilanterol (BREO ELLIPTA) 200-25 MCG/ACT AEPB Inhale 1 puff into the lungs daily.   Past Week   hydrochlorothiazide (HYDRODIURIL) 12.5 MG tablet Take 12.5 mg by mouth daily.   Past Week   levocetirizine (XYZAL) 5 MG tablet Take 5 mg by mouth every evening.   Past Week   meclizine  (ANTIVERT) 12.5 MG tablet Take 12.5 mg by mouth 3 (three) times daily as needed for dizziness.   Past Week   montelukast (SINGULAIR) 10 MG tablet Take 10 mg by mouth at bedtime.   Past Week   olmesartan (BENICAR) 40 MG tablet Take 40 mg by mouth daily.   Past Week   prochlorperazine (COMPAZINE) 10 MG tablet Take 10 mg by mouth every 6 (six) hours as needed for nausea or vomiting.   Past Week   sildenafil (VIAGRA) 50 MG tablet Take 50 mg by mouth daily as needed for erectile dysfunction.   Past Month   spironolactone (ALDACTONE) 25 MG tablet Take 25 mg by mouth daily.   Past Week   Tamsulosin HCl (FLOMAX) 0.4 MG CAPS Take 0.4 mg by mouth daily.   Past Week   Investigational - Study Medication Take 1 tablet by mouth in the morning and at bedtime. Study name: Librexia-Stroke Study Additional study details: Milvexian 25 mg or placebo       Inpatient Medications:   aspirin EC  81 mg Oral Daily   atorvastatin  40 mg Oral q1800   clopidogrel  75 mg Oral Daily   LIBREXIA-STROKE milvexian or placebo  1 tablet Oral BID   tamsulosin  0.8 mg Oral Daily    Allergies:  Allergies  Allergen Reactions   Lisinopril Other (See Comments)    cough   Augmentin [Amoxicillin-Pot Clavulanate] Rash    Has patient had a PCN reaction causing immediate rash, facial/tongue/throat swelling, SOB or lightheadedness with hypotension: Yes Has patient had a PCN reaction causing severe rash involving mucus membranes or skin necrosis: No Has patient had a PCN reaction that required hospitalization: No Has patient had a PCN reaction occurring within the last 10 years: Yes If all of the above answers are "NO", then may proceed with Cephalosporin use.    Doxycycline Rash    Social History   Socioeconomic History   Marital status: Married    Spouse name: Not on file   Number of children: 2   Years of education: Not on file   Highest education level: Not on file  Occupational History   Occupation: Teacher  Tobacco  Use   Smoking status: Some Days    Types: Cigars   Smokeless tobacco: Never   Tobacco comments:    occasional cigar  Vaping Use   Vaping Use: Never used  Substance and Sexual Activity   Alcohol use: Yes    Alcohol/week: 4.0 standard drinks of alcohol    Types: 4 Glasses of wine per week   Drug use: No   Sexual activity: Not on file  Other Topics Concern   Not on file  Social History Narrative   Not on file   Social Determinants of Health   Financial Resource Strain: Not on file  Food Insecurity: Not on file  Transportation Needs: Not on file  Physical Activity: Not on file  Stress: Not on file  Social Connections: Not on file  Intimate Partner Violence: Not on file  Family History  Problem Relation Age of Onset   Heart failure Mother    Aneurysm Father    Colon cancer Neg Hx    Esophageal cancer Neg Hx    Rectal cancer Neg Hx    Stomach cancer Neg Hx    Sleep apnea Neg Hx       Review of Systems: All other systems reviewed and are otherwise negative except as noted above.  Physical Exam: Vitals:   01/12/22 0320 01/12/22 0744 01/12/22 0744 01/12/22 1113  BP: 135/77 133/78 133/78 135/84  Pulse: 73 64 64 71  Resp: '20 17 17 18  '$ Temp: 98.1 F (36.7 C) 98.5 F (36.9 C) 98.5 F (36.9 C) 98 F (36.7 C)  TempSrc: Oral Oral Oral Oral  SpO2: 95% 96% 96% 95%  Weight:      Height:        GEN- The patient is well appearing, alert and oriented x 3 today.   Head- normocephalic, atraumatic Eyes-  Sclera clear, conjunctiva pink Ears- hearing intact Oropharynx- clear Neck- supple Lungs- CTA b/l, normal work of breathing Heart- RRR, no murmurs, rubs or gallops  GI- soft, NT, ND Extremities- no clubbing, cyanosis, or edema MS- no significant deformity or atrophy Skin- no rash or lesion Psych- euthymic mood, full affect   Labs:   Lab Results  Component Value Date   WBC 7.6 01/11/2022   HGB 13.4 01/11/2022   HCT 38.5 (L) 01/11/2022   MCV 84.8 01/11/2022    PLT 197 01/11/2022    Recent Labs  Lab 01/10/22 0631 01/11/22 0441 01/12/22 0654  NA 137   < > 137  K 4.3   < > 3.7  CL 104   < > 104  CO2 24   < > 23  BUN 21   < > 23  CREATININE 1.37*   < > 1.10  CALCIUM 8.8*   < > 8.2*  PROT 6.8  --   --   BILITOT 0.5  --   --   ALKPHOS 40  --   --   ALT 23  --   --   AST 26  --   --   GLUCOSE 151*   < > 100*   < > = values in this interval not displayed.   No results found for: "CKTOTAL", "CKMB", "CKMBINDEX", "TROPONINI" Lab Results  Component Value Date   CHOL 159 01/11/2022   Lab Results  Component Value Date   HDL 27 (L) 01/11/2022   Lab Results  Component Value Date   LDLCALC 111 (H) 01/11/2022   Lab Results  Component Value Date   TRIG 104 01/11/2022   Lab Results  Component Value Date   CHOLHDL 5.9 01/11/2022   No results found for: "LDLDIRECT"  No results found for: "DDIMER"   Radiology/Studies:   CT HEAD WO CONTRAST (5MM) Result Date: 01/12/2022 CLINICAL DATA:  66 year old male with dizziness. Right PICA infarct. EXAM: CT HEAD WITHOUT CONTRAST TECHNIQUE: Contiguous axial images were obtained from the base of the skull through the vertex without intravenous contrast. RADIATION DOSE REDUCTION: This exam was performed according to the departmental dose-optimization program which includes automated exposure control, adjustment of the mA and/or kV according to patient size and/or use of iterative reconstruction technique. COMPARISON:  Brain MRI, CTA head and neck, head CT 01/10/2022. FINDINGS: Brain: Confluent right PICA cerebellar infarct now plainly visible by CT. Right cerebellar Cytotoxic edema with no hemorrhagic transformation. No brainstem edema identified. Mild to moderate mass  effect on the 4th ventricle (series 2, image 9 versus series 3, image 102 days ago). But lateral and 3rd ventricle size remains within normal limits. No transependymal edema. Other basilar cisterns remain patent. No supratentorial mass effect  or midline shift. Supratentorial gray-white differentiation is stable and within normal limits. Vascular: Mild Calcified atherosclerosis at the skull base. Skull: Negative. Sinuses/Orbits: Scattered bubbly opacity and mild mucosal thickening in the paranasal sinuses is stable. Tympanic cavities and mastoids remain clear. Other: Visualized orbits and scalp soft tissues are within normal limits. IMPRESSION: 1. Expected CT appearance of confluent Right PICA infarct. No hemorrhagic transformation. Mass effect on the 4th ventricle, but no ventriculomegaly and basilar cisterns remain patent. 2. No new intracranial abnormality. Electronically Signed   By: Genevie Ann M.D.   On: 01/12/2022 07:26    MR BRAIN WO CONTRAST Result Date: 01/10/2022 CLINICAL DATA:  Headache, vertigo EXAM: MRI HEAD WITHOUT CONTRAST MRV HEAD WITHOUT AND WITH CONTRAST TECHNIQUE: Multiplanar, multi-echo pulse sequences of the brain and surrounding structures were acquired without intravenous contrast. Angiographic images of the intracranial venous structures were acquired using MRV technique without and with intravenous contrast. COMPARISON:  None Available. CONTRAST:  10 mL gadobutrol FINDINGS: MRI HEAD WITHOUT CONTRAST Brain: Restricted diffusion with ADC correlate in the right mid and inferior cerebellum (series 2, images 3-13), primarily the right PICA territory. This areas associated with mild edema and sulcal effacement. No definite effacement of the fourth ventricle. Focal susceptibility is noted in the expected location of the right PICA (series 7, image 15), concerning for thrombosis. No acute hemorrhage, mass, mass effect, or midline shift. No hemosiderin deposition to suggest remote infarct. Scattered T2 hyperintense signal in the periventricular white matter, likely the sequela of minimal cerebral volume is normal for age. Chronic small vessel ischemic disease. Vascular: Major arterial flow voids appear patent. As described above, there  is susceptibility in the expected location of the right PICA, concerning for thrombosis (series 7, image 15). On postcontrast imaging, contrast is noted in the major intracranial arteries and venous sinuses. Skull and upper cervical spine: Normal marrow signal. Sinuses/Orbits: Mild mucosal thickening in the maxillary sinuses with air-fluid level in the right maxillary sinus. Additional mucosal thickening in the ethmoid air cells. The orbits are unremarkable. Other: Trace fluid in the left mastoid air cells. MR VENOGRAM WITHOUT CONTRAST There is no evidence of dural venous sinus or deep cerebral vein thrombosis. No dural venous sinus stenosis. IMPRESSION: 1. Acute infarct in the right mid and inferior cerebellum, primarily in the right PICA territory, with mild edema and sulcal effacement but no definite effacement of the fourth ventricle. 2. Focal susceptibility in the expected location of the right PICA, concerning for thrombosis. 3. No evidence of dural venous sinus or deep cerebral vein thrombosis. 4. Air-fluid level in the right maxillary sinus, which is nonspecific but can be seen in the setting of acute sinusitis. These results were called by telephone at the time of interpretation on 01/10/2022 at 9:47 pm to provider [facility] , who verbally acknowledged these results. Electronically Signed   By: Merilyn Baba M.D.   On: 01/10/2022 21:47    CT ANGIO HEAD NECK W WO CM Result Date: 01/10/2022 CLINICAL DATA:  Vertigo, central. Dizziness last night with nausea and vomiting. Patient awoke at 3 a.m. with similar symptoms. EXAM: CT ANGIOGRAPHY HEAD AND NECK TECHNIQUE: Multidetector CT imaging of the head and neck was performed using the standard protocol during bolus administration of intravenous contrast. Multiplanar CT image reconstructions  and MIPs were obtained to evaluate the vascular anatomy. Carotid stenosis measurements (when applicable) are obtained utilizing NASCET criteria, using the distal internal  carotid diameter as the denominator. RADIATION DOSE REDUCTION: This exam was performed according to the departmental dose-optimization program which includes automated exposure control, adjustment of the mA and/or kV according to patient size and/or use of iterative reconstruction technique. CONTRAST:  6m OMNIPAQUE IOHEXOL 350 MG/ML SOLN COMPARISON:  CT head without contrast 01/10/2022 mild at 8:05 a.m. FINDINGS: CTA NECK FINDINGS Aortic arch: A common origin of the left common carotid artery and innominate artery noted. No significant stenosis or atherosclerotic disease is present at the aortic arch. Right carotid system: The right common carotid artery is within normal limits. Mild atherosclerotic changes are present along the posterior aspect of the bifurcation without significant stenosis. Mild tortuosity is present the distal cervical right ICA without significant stenosis. Left carotid system: Left common carotid artery is within normal limits. Atherosclerotic changes are present at the bifurcation without significant stenosis. The cervical left ICA is normal. Vertebral arteries: Right vertebral artery is the dominant vessel. Both vertebral arteries originate from the subclavian arteries without significant stenosis. Skeleton: Multilevel degenerative changes are present cervical spine. No focal disc disease or stenosis is present. Other neck: Soft tissues the neck are otherwise unremarkable. Salivary glands are within normal limits. Thyroid is normal. No significant adenopathy is present. No focal mucosal or submucosal lesions are present. Upper chest: Mild dependent atelectasis is present. No nodule or mass lesion is present. Thoracic inlet is within normal limits. Review of the MIP images confirms the above findings CTA HEAD FINDINGS Anterior circulation: The internal carotid arteries are within normal limits from high cervical segments through the scratched at the internal carotid arteries are within  normal limits through the ICA termini bilaterally. The A1 and M1 segments are normal. The anterior communicating artery is patent. MCA bifurcations are within normal limits bilaterally. The ACA and MCA branch vessels are normal. Posterior circulation: The right vertebral artery dominant vessel. Right PICA origin is visualized and normal. Dominant left AICA vessel is present. Vertebrobasilar junction and basilar artery normal. Left posterior cerebral artery originates from basilar tip. The right posterior cerebral artery is of fetal type. Small right P1 segment is present. PCA branch vessels are within normal limits bilaterally. Venous sinuses: The dural sinuses are patent. The straight sinus and deep cerebral veins are intact. Cortical veins are within normal limits. No significant vascular malformation is evident. Anatomic variants: Fetal type right posterior cerebral artery. Review of the MIP images confirms the above findings IMPRESSION: 1. Mild atherosclerotic changes at the carotid bifurcations bilaterally without significant stenosis. 2. Normal variant CTA Circle of Willis without significant proximal stenosis, aneurysm, or branch vessel occlusion. Electronically Signed   By: CSan MorelleM.D.   On: 01/10/2022 14:06   CT Head Wo Contrast Result Date: 01/10/2022 CLINICAL DATA:  Vertigo, peripheral Headache, new onset (Age >= 51y) EXAM: CT HEAD WITHOUT CONTRAST TECHNIQUE: Contiguous axial images were obtained from the base of the skull through the vertex without intravenous contrast. RADIATION DOSE REDUCTION: This exam was performed according to the departmental dose-optimization program which includes automated exposure control, adjustment of the mA and/or kV according to patient size and/or use of iterative reconstruction technique. COMPARISON:  None Available. FINDINGS: Brain: No evidence of acute infarction, hemorrhage, hydrocephalus, extra-axial collection or mass lesion/mass effect. Vascular:  No hyperdense vessel or unexpected calcification. Skull: Normal. Negative for fracture or focal lesion. Sinuses/Orbits: Mucoperiosteal thickening consistent  with mild chronic pansinusitis. IMPRESSION: No acute intracranial process. Electronically Signed   By: Sammie Bench M.D.   On: 01/10/2022 08:38   DG Chest 2 View Result Date: 01/10/2022 CLINICAL DATA:  dizziness EXAM: CHEST - 2 VIEW COMPARISON:  08/05/2016. FINDINGS: Cardiac silhouette is unremarkable. No pneumothorax or pleural effusion. The lungs are clear. Right hemidiaphragm is elevated. The visualized skeletal structures are unremarkable. IMPRESSION: Elevated right hemidiaphragm. Otherwise no acute cardiopulmonary process. Electronically Signed   By: Sammie Bench M.D.   On: 01/10/2022 08:33    12-lead ECG SR All prior EKG's in EPIC reviewed with no documented atrial fibrillation  Telemetry SR, occasional PVCs, rare PAC, 3-4beat PAT  Assessment and Plan:  1. Cryptogenic stroke The patient presents with cryptogenic stroke.   I spoke at length with the patient about monitoring for afib with either a 30 day event monitor or an implantable loop recorder.  Risks, benefits, and alteratives to implantable loop recorder were discussed with the patient today.   At this time, the patient is very clear in their decision to proceed with implantable loop recorder.   Wound care was reviewed with the patient (keep incision clean and dry for 3 days).  Wound check follow up Yoshika Vensel be scheduled for the patient.  Please call with questions.   Baldwin Jamaica, PA-C 01/12/2022  I have seen and examined this patient with Tommye Standard.  Agree with above, note added to reflect my findings.  On exam, RRR, no murmurs, lungs clear.  Patient presented to the hospital with cryptogenic stroke. To date, no cause has been found. Durga Saldarriaga plan for LINQ monitor to look for atrial fibrillation. Risks and benefits discussed. Risks include but not limited to bleeding  and infection. The patient understands the risks and has agreed to the procedure.  Meia Emley M. Makaila Windle MD 01/12/2022 2:44 PM

## 2022-01-12 NOTE — Progress Notes (Signed)
SLP Cancellation Note  Patient Details Name: Tyler [name] MRN: [id number] DOB: 1955-08-13   Cancelled treatment:       Reason Eval/Treat Not Completed: SLP screened, no needs identified, will sign off  Pt presents with intact speech, language and cognitive function. He was encouraged to rest at home with family members and notify PCP or call 911 if changes in these areas are noted.   Delbra Zellars B. Quentin Ore, [facility], [place] Speech Language Pathologist Office: [phone]  Shonna Chock 01/12/2022, 9:53 AM

## 2022-01-12 NOTE — Discharge Summary (Signed)
Physician Discharge Summary   Patient: Tyler [name] MRN: [id number] DOB: 02-14-1955  Admit date:     01/10/2022  Discharge date: 01/12/22  Discharge Physician: Elmarie Shiley   PCP: Haywood Pao, MD   Recommendations at discharge:   Follow up with PCP for further adjustment of BP medications.  Follow up with Neurology after stroke.  A 1c 6.1--- Carb modified diet recommended.   Discharge Diagnoses: Principal Problem:   Cerebellar stroke, acute ([place]) Active Problems:   Asthma   Essential hypertension   Obstructive sleep apnea syndrome   AKI (acute kidney injury) ([place])  Resolved Problems:   * No resolved hospital problems. [facility] Course: 66 year old with past medical history significant for asthma, hypertension, sleep apnea who presents complaining of headache, dizziness, nausea and vomiting.  Patient reports that he has been under a lot of stress, he developed right-sided throbbing headache, subsequently to 11/16 he developed spinning sensation and nausea.  He report vomiting anytime he moves.    MRI of the brain with concern for acute infarct in the right mid and inferior cerebellum.  Neurology was consulted and patient was admitted for further evaluation.    Assessment and Plan: 1-Acute cerebellar stroke: -Presented with headache, nausea and vomiting. -MRI: Acute infarct in the right mid and inferior cerebellum, primarily in the right PICA territory with mild edema and sulcal effacement.   -MRV: Focal susceptibility in the expected location of the right PICA, concerning for thrombosis. No evidence of dural venous sinus or deep cerebral vein thrombosis. -CTA: Mild atherosclerotic changes at the carotid bifurcation bilaterally without significant stenosis.  Normal variant CTA circle of Willis without significant proximal stenosis, aneurysm or branch vessel occlusion.  -LDL: 111, A1c: 6.1 Pre diabetic. Recommendation for carb modified diet.  -Neurology  recommend aspirin and Plavix together for 21 days then aspirin alone. He was also started on drug study.  ECHO no source of thrombosis identified.  CT head stable.  His neurological deficit are stable, no worsening headaches.   2-AKI: In setting of vomiting.  Prior Cr; 0.8 Cr peak to 1.3 Improved with IV fluids.    3-Hypertension: Permissive HTN.  Resume HCTZ and Omelsartan 12/20 if BP more than 140. Marland Kitchen   4-Asthma, OSA: Continue with Breo, Singular, PRN alv=buterol.  CPAP/            Consultants: Neurology, EP Procedures performed: Loop recorder placement.  Disposition: Home Diet recommendation:  Discharge Diet Orders (From admission, onward)     Start     Ordered   01/12/22 0000  Diet - low sodium heart healthy        01/12/22 1308           Carb modified diet DISCHARGE MEDICATION: Allergies as of 01/12/2022       Reactions   Lisinopril Other (See Comments)   cough   Augmentin [amoxicillin-pot Clavulanate] Rash   Has patient had a PCN reaction causing immediate rash, facial/tongue/throat swelling, SOB or lightheadedness with hypotension: Yes Has patient had a PCN reaction causing severe rash involving mucus membranes or skin necrosis: No Has patient had a PCN reaction that required hospitalization: No Has patient had a PCN reaction occurring within the last 10 years: Yes If all of the above answers are "NO", then may proceed with Cephalosporin use.   Doxycycline Rash        Medication List     STOP taking these medications    amLODipine 10 MG tablet Commonly known as:  NORVASC   fluticasone 50 MCG/ACT nasal spray Commonly known as: FLONASE   sildenafil 50 MG tablet Commonly known as: VIAGRA   spironolactone 25 MG tablet Commonly known as: ALDACTONE       TAKE these medications    ALBUTEROL IN Inhale 1-2 puffs into the lungs every 4 (four) hours as needed (shortness of breath).   aspirin EC 81 MG tablet Take 1 tablet (81 mg total) by  mouth daily. Swallow whole. Start taking on: January 13, 2022   atorvastatin 40 MG tablet Commonly known as: LIPITOR Take 1 tablet (40 mg total) by mouth daily at 6 PM.   B-12 PO Take 1 tablet by mouth daily.   Breo Ellipta 200-25 MCG/ACT Aepb Generic drug: fluticasone furoate-vilanterol Inhale 1 puff into the lungs daily.   CAL-MAG-ZINC PO Take 2 tablets by mouth daily.   clopidogrel 75 MG tablet Commonly known as: PLAVIX Take 1 tablet (75 mg total) by mouth daily for 21 days. Start taking on: January 13, 2022   ferrous sulfate 325 (65 FE) MG tablet Take 325 mg by mouth daily.   hydrochlorothiazide 12.5 MG tablet Commonly known as: HYDRODIURIL Take 12.5 mg by mouth daily.   Investigational - Study Medication Take 1 tablet by mouth in the morning and at bedtime. Study name: Librexia-Stroke Study Additional study details: Milvexian 25 mg or placebo   levocetirizine 5 MG tablet Commonly known as: XYZAL Take 5 mg by mouth every evening.   LIBREXIA-STROKE milvexian or placebo 25 mg tablet Take 1 tablet by mouth 2 (two) times daily.   meclizine 12.5 MG tablet Commonly known as: ANTIVERT Take 1 tablet (12.5 mg total) by mouth 3 (three) times daily as needed for dizziness.   montelukast 10 MG tablet Commonly known as: SINGULAIR Take 10 mg by mouth at bedtime.   olmesartan 40 MG tablet Commonly known as: BENICAR Take 40 mg by mouth daily.   prochlorperazine 10 MG tablet Commonly known as: COMPAZINE Take 1 tablet (10 mg total) by mouth 2 (two) times daily as needed for nausea or vomiting. What changed: when to take this   tamsulosin 0.4 MG Caps capsule Commonly known as: FLOMAX Take 0.4 mg by mouth daily.   VITAMIN D-3 PO Take 1 tablet by mouth daily.               Durable Medical Equipment  (From admission, onward)           Start     Ordered   01/12/22 1123  For home use only DME Walker rolling  Once       Question Answer Comment  Walker:  With [place]   Patient needs a walker to treat with the following condition Stroke [facility])      01/12/22 1123   01/11/22 1354  For home use only DME Walker rolling  Once       Question Answer Comment  Walker: With [place]   Patient needs a walker to treat with the following condition Gait abnormality      01/11/22 1354            Follow-up Information     Tisovec, Fransico Him, MD. Schedule an appointment as soon as possible for a visit .   Specialty: Internal Medicine Contact information: [place] [address] [phone]         [facility]. Schedule an appointment as soon as possible for a visit in 1 week(s).  Specialty: Rehabilitation Contact information: [place] [address] [place], [address] [place] [phone] [place]        Garvin Fila, MD Follow up in 1 month(s).   Specialties: Neurology, Radiology Contact information: [address] [place] [address] [phone]                Discharge Exam: Danley Danker Weights   01/10/22 0617  Weight: 99.8 kg   General; NAD   Condition at discharge: stable  The results of significant diagnostics from this hospitalization (including imaging, microbiology, ancillary and laboratory) are listed below for reference.   Imaging Studies: CT HEAD WO CONTRAST (5MM)  Result Date: 01/12/2022 CLINICAL DATA:  66 year old male with dizziness. Right PICA infarct. EXAM: CT HEAD WITHOUT CONTRAST TECHNIQUE: Contiguous axial images were obtained from the base of the skull through the vertex without intravenous contrast. RADIATION DOSE REDUCTION: This exam was performed according to the departmental dose-optimization program which includes automated exposure control, adjustment of the mA and/or kV according to patient size and/or use of iterative reconstruction technique. COMPARISON:  Brain MRI, CTA head and neck, head CT 01/10/2022.  FINDINGS: Brain: Confluent right PICA cerebellar infarct now plainly visible by CT. Right cerebellar Cytotoxic edema with no hemorrhagic transformation. No brainstem edema identified. Mild to moderate mass effect on the 4th ventricle (series 2, image 9 versus series 3, image 102 days ago). But lateral and 3rd ventricle size remains within normal limits. No transependymal edema. Other basilar cisterns remain patent. No supratentorial mass effect or midline shift. Supratentorial gray-white differentiation is stable and within normal limits. Vascular: Mild Calcified atherosclerosis at the skull base. Skull: Negative. Sinuses/Orbits: Scattered bubbly opacity and mild mucosal thickening in the paranasal sinuses is stable. Tympanic cavities and mastoids remain clear. Other: Visualized orbits and scalp soft tissues are within normal limits. IMPRESSION: 1. Expected CT appearance of confluent Right PICA infarct. No hemorrhagic transformation. Mass effect on the 4th ventricle, but no ventriculomegaly and basilar cisterns remain patent. 2. No new intracranial abnormality. Electronically Signed   By: Genevie Ann M.D.   On: 01/12/2022 07:26   ECHOCARDIOGRAM COMPLETE  Result Date: 01/11/2022    ECHOCARDIOGRAM REPORT   Patient Name:   AHLIJAH RAIA Date of Exam: 01/11/2022 Medical Rec #:  [id number]          Height:       70.0 in Accession #:    [id number]         Weight:       220.0 lb Date of Birth:  Sep 05, 1955         BSA:          2.174 m Patient Age:    66 years           BP:           136/76 mmHg Patient Gender: M                  HR:           66 bpm. Exam Location:  Inpatient Procedure: 2D Echo, Color Doppler and Cardiac Doppler Indications:    Stroke i63.9  History:        Patient has no prior history of Echocardiogram examinations.                 Risk Factors:Hypertension, Dyslipidemia and Sleep Apnea.  Sonographer:    Raquel Sarna Senior RDCS Referring Phys: [id number] [place]  1. Left ventricular  ejection fraction, by estimation, is 55 to 60%. The left ventricle has normal function. The left ventricle has no regional wall motion abnormalities. Left ventricular diastolic parameters were normal.  2. Right ventricular systolic function is normal. The right ventricular size is normal. Tricuspid regurgitation signal is inadequate for assessing PA pressure.  3. The mitral valve is degenerative. Trivial mitral valve regurgitation. No evidence of mitral stenosis.  4. The aortic valve is tricuspid. Aortic valve regurgitation is not visualized. No aortic stenosis is present.  5. The inferior vena cava is dilated in size with >50% respiratory variability, suggesting right atrial pressure of 8 mmHg. Conclusion(s)/Recommendation(s): No intracardiac source of embolism detected on this transthoracic study. Consider a transesophageal echocardiogram to exclude cardiac source of embolism if clinically indicated. FINDINGS  Left Ventricle: Left ventricular ejection fraction, by estimation, is 55 to 60%. The left ventricle has normal function. The left ventricle has no regional wall motion abnormalities. The left ventricular internal cavity size was normal in size. There is  no left ventricular hypertrophy. Left ventricular diastolic parameters were normal. Right Ventricle: The right ventricular size is normal. No increase in right ventricular wall thickness. Right ventricular systolic function is normal. Tricuspid regurgitation signal is inadequate for assessing PA pressure. Left Atrium: Left atrial size was normal in size. Right Atrium: Right atrial size was normal in size. Pericardium: There is no evidence of pericardial effusion. Mitral Valve: The mitral valve is degenerative in appearance. There is mild calcification of the anterior mitral valve leaflet(s). Mild mitral annular calcification. Trivial mitral valve regurgitation. No evidence of mitral valve stenosis. Tricuspid Valve: The tricuspid valve is grossly normal.  Tricuspid valve regurgitation is trivial. No evidence of tricuspid stenosis. Aortic Valve: The aortic valve is tricuspid. Aortic valve regurgitation is not visualized. No aortic stenosis is present. Pulmonic Valve: The pulmonic valve was grossly normal. Pulmonic valve regurgitation is not visualized. No evidence of pulmonic stenosis. Aorta: The aortic root and ascending aorta are structurally normal, with no evidence of dilitation. Venous: The inferior vena cava is dilated in size with greater than 50% respiratory variability, suggesting right atrial pressure of 8 mmHg. IAS/Shunts: The atrial septum is grossly normal.  LEFT VENTRICLE PLAX 2D LVIDd:         5.20 cm   Diastology LVIDs:         3.30 cm   LV e' medial:    7.83 cm/s LV PW:         1.10 cm   LV E/e' medial:  11.5 LV IVS:        0.80 cm   LV e' lateral:   11.30 cm/s LVOT diam:     2.20 cm   LV E/e' lateral: 8.0 LV SV:         92 LV SV Index:   42 LVOT Area:     3.80 cm  RIGHT VENTRICLE RV S prime:     10.10 cm/s TAPSE (M-mode): 2.6 cm LEFT ATRIUM             Index        RIGHT ATRIUM           Index LA diam:        3.60 cm 1.66 cm/m   RA Area:     18.40 cm LA Vol (A2C):   41.5 ml 19.09 ml/m  RA Volume:   47.50 ml  21.85 ml/m LA Vol (A4C):   78.7 ml 36.21 ml/m LA Biplane Vol: 62.4 ml 28.71 ml/m  AORTIC VALVE LVOT Vmax:   112.00 cm/s LVOT Vmean:  81.000 cm/s LVOT VTI:    0.241 m  AORTA Ao Root diam: 3.10 cm Ao Asc diam:  3.30 cm MITRAL VALVE MV Area (PHT): 3.10 cm    SHUNTS MV Decel Time: 245 msec    Systemic VTI:  0.24 m MV E velocity: 90.30 cm/s  Systemic Diam: 2.20 cm MV A velocity: 85.90 cm/s MV E/A ratio:  1.05 Eleonore Chiquito MD Electronically signed by Eleonore Chiquito MD Signature Date/Time: 01/11/2022/12:20:09 PM    Final    MR BRAIN WO CONTRAST  Result Date: 01/10/2022 CLINICAL DATA:  Headache, vertigo EXAM: MRI HEAD WITHOUT CONTRAST MRV HEAD WITHOUT AND WITH CONTRAST TECHNIQUE: Multiplanar, multi-echo pulse sequences of the brain and  surrounding structures were acquired without intravenous contrast. Angiographic images of the intracranial venous structures were acquired using MRV technique without and with intravenous contrast. COMPARISON:  None Available. CONTRAST:  10 mL gadobutrol FINDINGS: MRI HEAD WITHOUT CONTRAST Brain: Restricted diffusion with ADC correlate in the right mid and inferior cerebellum (series 2, images 3-13), primarily the right PICA territory. This areas associated with mild edema and sulcal effacement. No definite effacement of the fourth ventricle. Focal susceptibility is noted in the expected location of the right PICA (series 7, image 15), concerning for thrombosis. No acute hemorrhage, mass, mass effect, or midline shift. No hemosiderin deposition to suggest remote infarct. Scattered T2 hyperintense signal in the periventricular white matter, likely the sequela of minimal cerebral volume is normal for age. Chronic small vessel ischemic disease. Vascular: Major arterial flow voids appear patent. As described above, there is susceptibility in the expected location of the right PICA, concerning for thrombosis (series 7, image 15). On postcontrast imaging, contrast is noted in the major intracranial arteries and venous sinuses. Skull and upper cervical spine: Normal marrow signal. Sinuses/Orbits: Mild mucosal thickening in the maxillary sinuses with air-fluid level in the right maxillary sinus. Additional mucosal thickening in the ethmoid air cells. The orbits are unremarkable. Other: Trace fluid in the left mastoid air cells. MR VENOGRAM WITHOUT CONTRAST There is no evidence of dural venous sinus or deep cerebral vein thrombosis. No dural venous sinus stenosis. IMPRESSION: 1. Acute infarct in the right mid and inferior cerebellum, primarily in the right PICA territory, with mild edema and sulcal effacement but no definite effacement of the fourth ventricle. 2. Focal susceptibility in the expected location of the right  PICA, concerning for thrombosis. 3. No evidence of dural venous sinus or deep cerebral vein thrombosis. 4. Air-fluid level in the right maxillary sinus, which is nonspecific but can be seen in the setting of acute sinusitis. These results were called by telephone at the time of interpretation on 01/10/2022 at 9:47 pm to provider [facility] , who verbally acknowledged these results. Electronically Signed   By: Merilyn Baba M.D.   On: 01/10/2022 21:47   MR MRV HEAD W WO CONTRAST  Result Date: 01/10/2022 CLINICAL DATA:  Headache, vertigo EXAM: MRI HEAD WITHOUT CONTRAST MRV HEAD WITHOUT AND WITH CONTRAST TECHNIQUE: Multiplanar, multi-echo pulse sequences of the brain and surrounding structures were acquired without intravenous contrast. Angiographic images of the intracranial venous structures were acquired using MRV technique without and with intravenous contrast. COMPARISON:  None Available. CONTRAST:  10 mL gadobutrol FINDINGS: MRI HEAD WITHOUT CONTRAST Brain: Restricted diffusion with ADC correlate in the right mid and inferior cerebellum (series 2, images 3-13), primarily the right PICA territory. This areas associated with mild edema and sulcal effacement.  No definite effacement of the fourth ventricle. Focal susceptibility is noted in the expected location of the right PICA (series 7, image 15), concerning for thrombosis. No acute hemorrhage, mass, mass effect, or midline shift. No hemosiderin deposition to suggest remote infarct. Scattered T2 hyperintense signal in the periventricular white matter, likely the sequela of minimal cerebral volume is normal for age. Chronic small vessel ischemic disease. Vascular: Major arterial flow voids appear patent. As described above, there is susceptibility in the expected location of the right PICA, concerning for thrombosis (series 7, image 15). On postcontrast imaging, contrast is noted in the major intracranial arteries and venous sinuses. Skull and upper cervical  spine: Normal marrow signal. Sinuses/Orbits: Mild mucosal thickening in the maxillary sinuses with air-fluid level in the right maxillary sinus. Additional mucosal thickening in the ethmoid air cells. The orbits are unremarkable. Other: Trace fluid in the left mastoid air cells. MR VENOGRAM WITHOUT CONTRAST There is no evidence of dural venous sinus or deep cerebral vein thrombosis. No dural venous sinus stenosis. IMPRESSION: 1. Acute infarct in the right mid and inferior cerebellum, primarily in the right PICA territory, with mild edema and sulcal effacement but no definite effacement of the fourth ventricle. 2. Focal susceptibility in the expected location of the right PICA, concerning for thrombosis. 3. No evidence of dural venous sinus or deep cerebral vein thrombosis. 4. Air-fluid level in the right maxillary sinus, which is nonspecific but can be seen in the setting of acute sinusitis. These results were called by telephone at the time of interpretation on 01/10/2022 at 9:47 pm to provider [facility] , who verbally acknowledged these results. Electronically Signed   By: Merilyn Baba M.D.   On: 01/10/2022 21:47   CT ANGIO HEAD NECK W WO CM  Result Date: 01/10/2022 CLINICAL DATA:  Vertigo, central. Dizziness last night with nausea and vomiting. Patient awoke at 3 a.m. with similar symptoms. EXAM: CT ANGIOGRAPHY HEAD AND NECK TECHNIQUE: Multidetector CT imaging of the head and neck was performed using the standard protocol during bolus administration of intravenous contrast. Multiplanar CT image reconstructions and MIPs were obtained to evaluate the vascular anatomy. Carotid stenosis measurements (when applicable) are obtained utilizing NASCET criteria, using the distal internal carotid diameter as the denominator. RADIATION DOSE REDUCTION: This exam was performed according to the departmental dose-optimization program which includes automated exposure control, adjustment of the mA and/or kV according to  patient size and/or use of iterative reconstruction technique. CONTRAST:  90m OMNIPAQUE IOHEXOL 350 MG/ML SOLN COMPARISON:  CT head without contrast 01/10/2022 mild at 8:05 a.m. FINDINGS: CTA NECK FINDINGS Aortic arch: A common origin of the left common carotid artery and innominate artery noted. No significant stenosis or atherosclerotic disease is present at the aortic arch. Right carotid system: The right common carotid artery is within normal limits. Mild atherosclerotic changes are present along the posterior aspect of the bifurcation without significant stenosis. Mild tortuosity is present the distal cervical right ICA without significant stenosis. Left carotid system: Left common carotid artery is within normal limits. Atherosclerotic changes are present at the bifurcation without significant stenosis. The cervical left ICA is normal. Vertebral arteries: Right vertebral artery is the dominant vessel. Both vertebral arteries originate from the subclavian arteries without significant stenosis. Skeleton: Multilevel degenerative changes are present cervical spine. No focal disc disease or stenosis is present. Other neck: Soft tissues the neck are otherwise unremarkable. Salivary glands are within normal limits. Thyroid is normal. No significant adenopathy is present. No focal mucosal or  submucosal lesions are present. Upper chest: Mild dependent atelectasis is present. No nodule or mass lesion is present. Thoracic inlet is within normal limits. Review of the MIP images confirms the above findings CTA HEAD FINDINGS Anterior circulation: The internal carotid arteries are within normal limits from high cervical segments through the scratched at the internal carotid arteries are within normal limits through the ICA termini bilaterally. The A1 and M1 segments are normal. The anterior communicating artery is patent. MCA bifurcations are within normal limits bilaterally. The ACA and MCA branch vessels are normal.  Posterior circulation: The right vertebral artery dominant vessel. Right PICA origin is visualized and normal. Dominant left AICA vessel is present. Vertebrobasilar junction and basilar artery normal. Left posterior cerebral artery originates from basilar tip. The right posterior cerebral artery is of fetal type. Small right P1 segment is present. PCA branch vessels are within normal limits bilaterally. Venous sinuses: The dural sinuses are patent. The straight sinus and deep cerebral veins are intact. Cortical veins are within normal limits. No significant vascular malformation is evident. Anatomic variants: Fetal type right posterior cerebral artery. Review of the MIP images confirms the above findings IMPRESSION: 1. Mild atherosclerotic changes at the carotid bifurcations bilaterally without significant stenosis. 2. Normal variant CTA Circle of Willis without significant proximal stenosis, aneurysm, or branch vessel occlusion. Electronically Signed   By: San Morelle M.D.   On: 01/10/2022 14:06   CT Head Wo Contrast  Result Date: 01/10/2022 CLINICAL DATA:  Vertigo, peripheral Headache, new onset (Age >= 51y) EXAM: CT HEAD WITHOUT CONTRAST TECHNIQUE: Contiguous axial images were obtained from the base of the skull through the vertex without intravenous contrast. RADIATION DOSE REDUCTION: This exam was performed according to the departmental dose-optimization program which includes automated exposure control, adjustment of the mA and/or kV according to patient size and/or use of iterative reconstruction technique. COMPARISON:  None Available. FINDINGS: Brain: No evidence of acute infarction, hemorrhage, hydrocephalus, extra-axial collection or mass lesion/mass effect. Vascular: No hyperdense vessel or unexpected calcification. Skull: Normal. Negative for fracture or focal lesion. Sinuses/Orbits: Mucoperiosteal thickening consistent with mild chronic pansinusitis. IMPRESSION: No acute intracranial  process. Electronically Signed   By: Sammie Bench M.D.   On: 01/10/2022 08:38   DG Chest 2 View  Result Date: 01/10/2022 CLINICAL DATA:  dizziness EXAM: CHEST - 2 VIEW COMPARISON:  08/05/2016. FINDINGS: Cardiac silhouette is unremarkable. No pneumothorax or pleural effusion. The lungs are clear. Right hemidiaphragm is elevated. The visualized skeletal structures are unremarkable. IMPRESSION: Elevated right hemidiaphragm. Otherwise no acute cardiopulmonary process. Electronically Signed   By: Sammie Bench M.D.   On: 01/10/2022 08:33    Microbiology: Results for orders placed or performed during the hospital encounter of 01/10/22  Resp panel by RT-PCR (RSV, Flu A&B, Covid) Anterior Nasal Swab     Status: None   Collection Time: 01/10/22  6:55 AM   Specimen: Anterior Nasal Swab  Result Value Ref Range Status   SARS Coronavirus 2 by RT PCR NEGATIVE NEGATIVE Final    Comment: (NOTE) SARS-CoV-2 target nucleic acids are NOT DETECTED.  The SARS-CoV-2 RNA is generally detectable in upper respiratory specimens during the acute phase of infection. The lowest concentration of SARS-CoV-2 viral copies this assay can detect is 138 copies/mL. A negative result does not preclude SARS-Cov-2 infection and should not be used as the sole basis for treatment or other patient management decisions. A negative result may occur with  improper specimen collection/handling, submission of specimen other than nasopharyngeal swab,  presence of viral mutation(s) within the areas targeted by this assay, and inadequate number of viral copies(<138 copies/mL). A negative result must be combined with clinical observations, patient history, and epidemiological information. The expected result is Negative.  Fact Sheet for Patients:  EntrepreneurPulse.com.au  Fact Sheet for Healthcare Providers:  IncredibleEmployment.be  This test is no t yet approved or cleared by the [place] FDA and  has been authorized for detection and/or diagnosis of SARS-CoV-2 by FDA under an Emergency Use Authorization (EUA). This EUA will remain  in effect (meaning this test can be used) for the duration of the COVID-19 declaration under Section 564(b)(1) of the Act, 21 U.S.C.section 360bbb-3(b)(1), unless the authorization is terminated  or revoked sooner.       Influenza A by PCR NEGATIVE NEGATIVE Final   Influenza B by PCR NEGATIVE NEGATIVE Final    Comment: (NOTE) The Xpert Xpress SARS-CoV-2/FLU/RSV plus assay is intended as an aid in the diagnosis of influenza from Nasopharyngeal swab specimens and should not be used as a sole basis for treatment. Nasal washings and aspirates are unacceptable for Xpert Xpress SARS-CoV-2/FLU/RSV testing.  Fact Sheet for Patients: EntrepreneurPulse.com.au  Fact Sheet for Healthcare Providers: IncredibleEmployment.be  This test is not yet approved or cleared by the [place] FDA and has been authorized for detection and/or diagnosis of SARS-CoV-2 by FDA under an Emergency Use Authorization (EUA). This EUA will remain in effect (meaning this test can be used) for the duration of the COVID-19 declaration under Section 564(b)(1) of the Act, 21 U.S.C. section 360bbb-3(b)(1), unless the authorization is terminated or revoked.     Resp Syncytial Virus by PCR NEGATIVE NEGATIVE Final    Comment: (NOTE) Fact Sheet for Patients: EntrepreneurPulse.com.au  Fact Sheet for Healthcare Providers: IncredibleEmployment.be  This test is not yet approved or cleared by the [place] FDA and has been authorized for detection and/or diagnosis of SARS-CoV-2 by FDA under an Emergency Use Authorization (EUA). This EUA will remain in effect (meaning this test can be used) for the duration of the COVID-19 declaration under Section 564(b)(1) of the Act, 21 U.S.C. section  360bbb-3(b)(1), unless the authorization is terminated or revoked.  Performed at [facility], [place] [address] [place], [place] [address]     Labs: CBC: Recent Labs  Lab 01/10/22 0631 01/11/22 0441  WBC 4.9 7.6  NEUTROABS 3.2  --   HGB 13.8 13.4  HCT 39.5 38.5*  MCV 83.7 84.8  PLT 192 086   Basic Metabolic Panel: Recent Labs  Lab 01/10/22 0631 01/11/22 0441 01/12/22 0654  NA 137 136 137  K 4.3 3.7 3.7  CL 104 103 104  CO2 24 21* 23  GLUCOSE 151* 121* 100*  BUN 21 28* 23  CREATININE 1.37* 1.15 1.10  CALCIUM 8.8* 8.3* 8.2*   Liver Function Tests: Recent Labs  Lab 01/10/22 0631  AST 26  ALT 23  ALKPHOS 40  BILITOT 0.5  PROT 6.8  ALBUMIN 3.8   CBG: No results for input(s): "GLUCAP" in the last 168 hours.  Discharge time spent: greater than 30 minutes.  Signed: Elmarie Shiley, MD [facility] 01/12/2022

## 2022-01-12 NOTE — TOC Transition Note (Signed)
Transition of Care [facility]) - CM/SW Discharge Note   Patient Details  Name: Tyler [name] MRN: [id number] Date of Birth: 1955-04-01  Transition of [facility] CM/SW Contact:  Pollie Friar, RN Phone Number: 01/12/2022, 11:36 AM   Clinical Narrative:    Pt is from home with his spouse. Daughter will also be staying at the home for the next 2 weeks.  Walker for home ordered through [facility] and will be delivered to the room.  Pt has recommendations  for outpatient and [place] services. Pt prefers outpatient at [facility]. CM has sent the referral and pt to call to schedule an appointment after d/c.  Pt manages his own medications at home without any issues.  Wife is able to provide needed transportation. She will transport him home when d/ced.   Final next level of care: OP Rehab Barriers to Discharge: No Barriers Identified   Patient Goals and CMS Choice     Choice offered to / list presented to : Patient  Discharge Placement                       Discharge Plan and Services                DME Arranged: Walker rolling DME Agency: [employer] Date DME Agency Contacted: 01/12/22   Representative spoke with at DME Agency: Clarisse Gouge            Social Determinants of Health ([place]) Interventions     Readmission Risk Interventions     No data to display

## 2022-01-13 ENCOUNTER — Encounter (HOSPITAL_COMMUNITY): Payer: Self-pay | Admitting: Cardiology

## 2022-01-14 ENCOUNTER — Encounter: Payer: Self-pay | Admitting: Adult Health

## 2022-01-15 ENCOUNTER — Telehealth: Payer: Self-pay

## 2022-01-15 NOTE — Telephone Encounter (Signed)
-----   Message from [place], [place] sent at 01/12/2022  1:30 PM EST ----- Loop today with WC  MDT

## 2022-01-15 NOTE — Telephone Encounter (Signed)
Loop Recorder Follow up   Is patient connected to [facility]? Yes   Have steri-strips fallen off or been removed? No-Pt has follow up with PCP after Christmas to evaluate site   Does the patient need in office follow up? No   Please continue to monitor your cardiac device site for redness, swelling, and drainage. Call the device clinic at [phone] if you experience these symptoms, fever/chills, or have questions about your device.   Remote monitoring is used to monitor your cardiac device from home. This monitoring is scheduled every month by our office. It allows [place] to keep an eye on the functioning of your device to ensure it is working properly.   All questions answered.

## 2022-01-19 DIAGNOSIS — E669 Obesity, unspecified: Secondary | ICD-10-CM | POA: Diagnosis not present

## 2022-01-19 DIAGNOSIS — Z8673 Personal history of transient ischemic attack (TIA), and cerebral infarction without residual deficits: Secondary | ICD-10-CM | POA: Diagnosis not present

## 2022-01-19 DIAGNOSIS — G4733 Obstructive sleep apnea (adult) (pediatric): Secondary | ICD-10-CM | POA: Diagnosis not present

## 2022-01-19 DIAGNOSIS — E7800 Pure hypercholesterolemia, unspecified: Secondary | ICD-10-CM | POA: Diagnosis not present

## 2022-01-19 DIAGNOSIS — I10 Essential (primary) hypertension: Secondary | ICD-10-CM | POA: Diagnosis not present

## 2022-01-28 ENCOUNTER — Other Ambulatory Visit: Payer: Self-pay

## 2022-01-28 ENCOUNTER — Ambulatory Visit: Payer: Medicare PPO | Admitting: Occupational Therapy

## 2022-01-28 ENCOUNTER — Ambulatory Visit: Payer: Medicare PPO | Attending: Internal Medicine

## 2022-01-28 DIAGNOSIS — J3089 Other allergic rhinitis: Secondary | ICD-10-CM | POA: Diagnosis not present

## 2022-01-28 DIAGNOSIS — J301 Allergic rhinitis due to pollen: Secondary | ICD-10-CM | POA: Diagnosis not present

## 2022-01-28 DIAGNOSIS — R2681 Unsteadiness on feet: Secondary | ICD-10-CM

## 2022-01-28 DIAGNOSIS — R29818 Other symptoms and signs involving the nervous system: Secondary | ICD-10-CM

## 2022-01-28 DIAGNOSIS — J3081 Allergic rhinitis due to animal (cat) (dog) hair and dander: Secondary | ICD-10-CM | POA: Diagnosis not present

## 2022-01-28 DIAGNOSIS — R262 Difficulty in walking, not elsewhere classified: Secondary | ICD-10-CM | POA: Diagnosis not present

## 2022-01-28 NOTE — Therapy (Signed)
OUTPATIENT OCCUPATIONAL THERAPY NEURO EVALUATION  Patient Name: Tyler [name] MRN: [id number] DOB:July 20, 1955, 67 y.o., male Today's Date: 01/28/2022  PCP: Haywood Pao, MD REFERRING PROVIDER: Elmarie Shiley, MD  END OF SESSION:  OT End of Session - 01/28/22 1740     Visit Number 1    Number of Visits 1    Authorization Type [employer] Medicare    OT Start Time 0803    OT Stop Time 0843    OT Time Calculation (min) 40 min    Activity Tolerance Patient tolerated treatment well    Behavior During Therapy [facility] for tasks assessed/performed             Past Medical History:  Diagnosis Date   Arthritis    Asthma    Complication of anesthesia 08/17/2016   pt had urinary retention following last sinus surgery that required him to go to the ED to be in and out cathedx1   History of hiatal hernia    Hypertension    Sleep apnea    Past Surgical History:  Procedure Laterality Date   LOOP RECORDER INSERTION N/A 01/12/2022   Procedure: LOOP RECORDER INSERTION;  Surgeon: Constance Haw, MD;  Location: [place] [facility];  Service: Cardiovascular;  Laterality: N/A;   NASAL SINUS SURGERY  2002   ROTATOR CUFF REPAIR     SEPTOPLASTY WITH ETHMOIDECTOMY, AND MAXILLARY ANTROSTOMY N/A 08/19/2016   Procedure: SEPTOPLASTY WITH ETHMOIDECTOMY, AND MAXILLARY ANTROSTOMY;  Surgeon: [name], MD;  Location: [place] [facility];  Service: ENT;  Laterality: N/A;   TURBINATE REDUCTION N/A 08/19/2016   Procedure: BILATERAL TURBINATE REDUCTION;  Surgeon: [name], MD;  Location: [place];  Service: ENT;  Laterality: N/A;   WISDOM TOOTH EXTRACTION     Patient Active Problem List   Diagnosis Date Noted   Cerebellar stroke, acute ([place]) 01/11/2022   AKI (acute kidney injury) ([place]) 01/11/2022   Pure hypercholesterolemia 05/06/2020   Bilateral impacted cerumen 03/11/2020   Conductive hearing loss, bilateral 03/11/2020   Cholelithiasis without obstruction 11/17/2018   Fatty liver 11/17/2018    Deviated nasal septum 08/19/2016   Asthma 08/05/2016   Cough 07/09/2016   Wheezing 07/09/2016   Hypertrophy of nasal turbinates 05/10/2016   Chronic left maxillary sinusitis 04/08/2016   Bronchitis 01/23/2016   Rhinitis, chronic 01/23/2016   Benign prostatic hyperplasia with lower urinary tract symptoms 03/01/2015   Male erectile disorder 03/01/2015   Encounter for general adult medical examination without abnormal findings 02/27/2015   Testicular hypofunction 02/15/2013   Dysphagia, unspecified(787.20) 03/20/2012   Nonspecific abnormal finding in stool contents 03/20/2012   Obesity 02/18/2012   Essential hypertension 01/30/2009   Migraine 01/30/2009   Obstructive sleep apnea syndrome 01/30/2009    ONSET DATE: 01/10/22  REFERRING DIAG: I63.9 (ICD-10-CM) - Cerebellar stroke, acute  THERAPY DIAG:  Other symptoms and signs involving the nervous system  Unsteadiness on feet  Rationale for Evaluation and Treatment: Rehabilitation  SUBJECTIVE:   SUBJECTIVE STATEMENT: Pt reports noticing change in temperature in L arm with sensation of hot/cold in arm and tingly sensation in L arm, side and leg.  Pt reports restless leg prior but is more prevalent and noticeable. Pt also reports he feels that he gets tired easier and that is when he reports still having some dizziness.  Pt reports that he still uses RW when he is in unfamiliar environments or walking long distances, but is not using it in the home. Pt accompanied by: self and significant other (wife,  Izora Gala)  PERTINENT HISTORY: Pt is a 67 y/o male presenting on 12/17 with headache and dizziness. 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 PMH includes: arthritis, HTN, asthma.  PRECAUTIONS: Fall  WEIGHT BEARING RESTRICTIONS: No  PAIN:  Are you having pain? No  FALLS: Has patient fallen in last 6 months? No  LIVING ENVIRONMENT: Lives with: lives with their spouse Lives in: House/apartment Stairs: Yes: Internal: full  flight of steps with bedroom upstairs steps; on right going up and External: 3 steps to come in through garage, 6 to front door steps; on right going up Has following equipment at home: Walker - 2 wheeled, shower chair, and Grab bars  PLOF: Independent  PATIENT GOALS: to move with better balance  OBJECTIVE:   HAND DOMINANCE: Left  ADLs: Overall ADLs: Independent overall, reports shaving without any issue. Transfers/ambulation related to ADLs: Mod I, no AD this session Tub Shower transfers: reports brand new tub but has been able to get in/out of tub without issue Equipment: Shower seat without back and Grab bars  IADLs: Shopping: Mod I  Light housekeeping: Mod I Meal Prep: Mod I Community mobility: has not returned to driving; has noticed increased dizziness with increased visual stimulus such as grocery store or in church Medication management: adult daughter wrote up morning/evening meds and pt is then able to fill pill box and take appropriately Financial management: spouse does that primarily Handwriting: 100% legible  MOBILITY STATUS: Independent  POSTURE COMMENTS:  No Significant postural limitations  ACTIVITY TOLERANCE: Activity tolerance: WFL for tasks assessed on eval   UPPER EXTREMITY ROM:  WFL bilaterally  UPPER EXTREMITY MMT:   Grossly 4+/5 to 5/5 bilaterally  HAND FUNCTION: Grip strength: Right: 64 lbs; Left: 80 lbs  COORDINATION: Finger Nose Finger test: [facility] Peg test: Right: 25.85 sec; Left: 22.75 sec  SENSATION: WFL Reports sensation of extreme hot and extreme cold in arm at times  COGNITION: Overall cognitive status: Within functional limits for tasks assessed  VISION: Subjective report: notices slight  lag when following L > R Baseline vision: Wears glasses all the time and progressives Visual history:  beginnings of cataracts  VISION ASSESSMENT: Eye alignment: WFL Ocular ROM: WFL Tracking/Visual pursuits: Able to track stimulus in  all quads without difficulty Saccades: decreased speed of saccadic movements Visual Fields: no apparent deficits  Patient has difficulty with following activities due to following visual impairments: reports engaging in visual exercises from ACUTE care and noticing mild delay in tracking from L > R  PERCEPTION: WFL  PRAXIS: WFL   TODAY'S TREATMENT:                                                                    NA eval only  PATIENT EDUCATION: Education details: Educated on role and purpose of OT as well as potential interventions and goals for therapy based on initial evaluation findings. Person educated: Patient and Spouse Education method: Explanation Education comprehension: verbalized understanding  HOME EXERCISE PROGRAM: NA  ASSESSMENT:  CLINICAL IMPRESSION: Patient is a 67 y.o. male who was seen today for occupational therapy evaluation for impairments s/p cerebellar stroke. Pt currently lives with spouse in a 2 story home with bedroom on 2nd floor and is retired from [employer].  Pt reports changes in sensation in L hemi-body but no coordination or strength deficits and no impairments impacting ability to carry out ADLs or IADLs at PLOF. Pt does still report mild dizziness with fatigue and increased visual stimulus.  OT encouraged pt to f/u with PCP or neurologist prior to return to driving.  Pt will not require skilled occupational therapy services at this time.  PERFORMANCE DEFICITS: none  IMPAIRMENTS: are limiting patient from  N/A .   CO-MORBIDITIES: may have co-morbidities  that affects occupational performance. Reports of migraines that also affect patient's function.  MODIFICATION OR ASSISTANCE TO COMPLETE EVALUATION: No modification of tasks or assist necessary to complete an evaluation.  OT OCCUPATIONAL PROFILE AND HISTORY: Problem focused assessment: Including review of records relating to presenting problem.  CLINICAL DECISION MAKING: LOW - limited treatment  options, no task modification necessary  REHAB POTENTIAL: N/A  EVALUATION COMPLEXITY: Low    PLAN:  OT FREQUENCY: one time visit  OT DURATION: other: eval only  PLANNED INTERVENTIONS:  evaluation only  RECOMMENDED OTHER SERVICES: N/A  CONSULTED AND AGREED WITH PLAN OF CARE: Patient and family member/caregiver  PLAN FOR NEXT SESSION: Dillon Bjork, OTR/L 01/28/2022, 9:38 AM

## 2022-01-28 NOTE — Therapy (Signed)
OUTPATIENT PHYSICAL THERAPY NEURO EVALUATION   Patient Name: Tyler [name] MRN: [id number] DOB:August 16, 1955, 67 y.o., male Today's Date: 01/28/2022   PCP: Haywood Pao, MD REFERRING PROVIDER: Elmarie Shiley, MD  END OF SESSION:  PT End of Session - 01/28/22 0809     Visit Number 1    Authorization Type [employer] Medicare    Authorization Time Period auth required: [place]    PT Start Time 0845    PT Stop Time 0930    PT Time Calculation (min) 45 min             Past Medical History:  Diagnosis Date   Arthritis    Asthma    Complication of anesthesia 08/17/2016   pt had urinary retention following last sinus surgery that required him to go to the ED to be in and out cathedx1   History of hiatal hernia    Hypertension    Sleep apnea    Past Surgical History:  Procedure Laterality Date   LOOP RECORDER INSERTION N/A 01/12/2022   Procedure: LOOP RECORDER INSERTION;  Surgeon: Constance Haw, MD;  Location: [place] [facility];  Service: Cardiovascular;  Laterality: N/A;   NASAL SINUS SURGERY  2002   ROTATOR CUFF REPAIR     SEPTOPLASTY WITH ETHMOIDECTOMY, AND MAXILLARY ANTROSTOMY N/A 08/19/2016   Procedure: SEPTOPLASTY WITH ETHMOIDECTOMY, AND MAXILLARY ANTROSTOMY;  Surgeon: [name], MD;  Location: [place] [facility];  Service: ENT;  Laterality: N/A;   TURBINATE REDUCTION N/A 08/19/2016   Procedure: BILATERAL TURBINATE REDUCTION;  Surgeon: [name], MD;  Location: [place];  Service: ENT;  Laterality: N/A;   WISDOM TOOTH EXTRACTION     Patient Active Problem List   Diagnosis Date Noted   Cerebellar stroke, acute ([place]) 01/11/2022   AKI (acute kidney injury) ([place]) 01/11/2022   Pure hypercholesterolemia 05/06/2020   Bilateral impacted cerumen 03/11/2020   Conductive hearing loss, bilateral 03/11/2020   Cholelithiasis without obstruction 11/17/2018   Fatty liver 11/17/2018   Deviated nasal septum 08/19/2016   Asthma 08/05/2016   Cough 07/09/2016   Wheezing  07/09/2016   Hypertrophy of nasal turbinates 05/10/2016   Chronic left maxillary sinusitis 04/08/2016   Bronchitis 01/23/2016   Rhinitis, chronic 01/23/2016   Benign prostatic hyperplasia with lower urinary tract symptoms 03/01/2015   Male erectile disorder 03/01/2015   Encounter for general adult medical examination without abnormal findings 02/27/2015   Testicular hypofunction 02/15/2013   Dysphagia, unspecified(787.20) 03/20/2012   Nonspecific abnormal finding in stool contents 03/20/2012   Obesity 02/18/2012   Essential hypertension 01/30/2009   Migraine 01/30/2009   Obstructive sleep apnea syndrome 01/30/2009    ONSET DATE: 01/10/22  REFERRING DIAG: I63.9 (ICD-10-CM) - Cerebellar stroke, acute  THERAPY DIAG:  No diagnosis found.  Rationale for Evaluation and Treatment: Rehabilitation  SUBJECTIVE:  SUBJECTIVE STATEMENT: Pt notes that since experiencing stroke and notes some sensory disturbance in LUE/side/leg and notes some imbalance and some unsteadiness of gaze.  Notes hx of restless leg syndrome. Notes some sensations of dizziness but denies vertigo  Pt accompanied by: self  PERTINENT HISTORY: 67 year old with past medical history significant for asthma, hypertension, sleep apnea who presents complaining of headache, dizziness, nausea and vomiting.  Patient reports that he has been under a lot of stress, he developed right-sided throbbing headache, subsequently to 11/16 he developed spinning sensation and nausea.  He report vomiting anytime he moves.    MRI of the brain with concern for acute infarct in the right mid and inferior cerebellum.  Neurology was consulted and patient was admitted for further evaluation.  PAIN:  Are you having pain? No  PRECAUTIONS: None  WEIGHT BEARING  RESTRICTIONS: No  FALLS: Has patient fallen in last 6 months? No  LIVING ENVIRONMENT: Lives with: lives with their spouse Lives in: House/apartment Stairs: Yes: Internal: 2 floors steps; on right going up Has following equipment at home: Gilford Rile - 2 wheeled  PLOF: Independent  PATIENT GOALS:   OBJECTIVE:   Where's progressive lens glasses  DIAGNOSTIC FINDINGS: FINDINGS: Brain: Confluent right PICA cerebellar infarct now plainly visible by CT. Right cerebellar Cytotoxic edema with no hemorrhagic transformation. No brainstem edema identified.  COGNITION: Overall cognitive status: Within functional limits for tasks assessed   SENSATION: Not tested pt notes transient sensory disturbance along left side  COORDINATION: WNL  EDEMA:   Oculomotor exam WNL, no signs/reports of central or peripheral dizziness  MUSCLE TONE: WNL  MUSCLE LENGTH:   DTRs:  NT  POSTURE: No Significant postural limitations  LOWER EXTREMITY ROM:     WNL  LOWER EXTREMITY MMT:    5/5  BED MOBILITY:  Indep  TRANSFERS: Independent RAMP:  Independent  CURB:  Level of Assistance: Complete Independence Assistive device utilized: None Curb Comments:    STAIRS: Level of Assistance: Complete Independence Stair Negotiation Technique: Alternating Pattern  with No Rails Number of Stairs: 15  Height of Stairs: 4-6"  Comments:   GAIT: Gait pattern: WFL Distance walked: community-level Assistive device utilized: None Level of assistance: Complete Independence Comments: WNL  FUNCTIONAL TESTS:  5 times sit to stand: 10.25 sec Timed up and go (TUG): NT 10 meter walk test: 4.68 ft/sec Berg Balance Scale: 56/56 Dynamic Gait Index: 24/24 M-CTSIB  Condition 1: Firm Surface, EO 30 Sec, Normal Sway  Condition 2: Firm Surface, EC 30 Sec, Normal and Mild Sway  Condition 3: Foam Surface, EO 30 Sec, Normal and Mild Sway  Condition 4: Foam Surface, EC 30 Sec, Normal and Mild Sway      PATIENT SURVEYS:    TODAY'S TREATMENT:                                                                                                                              DATE:  PATIENT EDUCATION: Education details: assessment findings Person educated: Patient and Spouse Education method: Explanation Education comprehension: verbalized understanding  HOME EXERCISE PROGRAM: na  GOALS: Goals reviewed with patient? N.A  ASSESSMENT:  CLINICAL IMPRESSION: Patient is a 67 y.o. male who was seen today for physical therapy evaluation and treatment for recent acute CVA affecting cerebellum. Thankfully pt exhibits little in the way of deficits and limitations impacting functional mobility and demo low risk for falls per several fall risk assessments and demo normal gait pattern and 5/5 strength BLE.  Pt encouraged to f/u with MD regarding medical issues and resume typical physical exercise regimen as able  OBJECTIVE IMPAIRMENTS: none.   ACTIVITY LIMITATIONS: none  PARTICIPATION LIMITATIONS: none  PERSONAL FACTORS: 1 comorbidity: migraine  are also affecting patient's functional outcome.   REHAB POTENTIAL: N/A  CLINICAL DECISION MAKING: Stable/uncomplicated  EVALUATION COMPLEXITY: Low  PLAN:  PT FREQUENCY: one time visit  PT DURATION:   PLANNED INTERVENTIONS: None needed at this time, evaluation only  PLAN FOR NEXT SESSION: n/a   9:24 AM, 01/28/22 M. Sherlyn Lees, PT, DPT Physical Therapist- [place] Office Number: [phone]

## 2022-02-05 DIAGNOSIS — J301 Allergic rhinitis due to pollen: Secondary | ICD-10-CM | POA: Diagnosis not present

## 2022-02-05 DIAGNOSIS — J3081 Allergic rhinitis due to animal (cat) (dog) hair and dander: Secondary | ICD-10-CM | POA: Diagnosis not present

## 2022-02-05 DIAGNOSIS — J3089 Other allergic rhinitis: Secondary | ICD-10-CM | POA: Diagnosis not present

## 2022-02-11 DIAGNOSIS — J301 Allergic rhinitis due to pollen: Secondary | ICD-10-CM | POA: Diagnosis not present

## 2022-02-11 DIAGNOSIS — J3089 Other allergic rhinitis: Secondary | ICD-10-CM | POA: Diagnosis not present

## 2022-02-11 DIAGNOSIS — J3081 Allergic rhinitis due to animal (cat) (dog) hair and dander: Secondary | ICD-10-CM | POA: Diagnosis not present

## 2022-02-15 ENCOUNTER — Ambulatory Visit: Payer: Medicare PPO | Attending: Cardiology

## 2022-02-15 DIAGNOSIS — I495 Sick sinus syndrome: Secondary | ICD-10-CM

## 2022-02-16 ENCOUNTER — Inpatient Hospital Stay: Payer: Medicare PPO | Admitting: Neurology

## 2022-02-16 DIAGNOSIS — G4733 Obstructive sleep apnea (adult) (pediatric): Secondary | ICD-10-CM | POA: Diagnosis not present

## 2022-02-16 DIAGNOSIS — Z8673 Personal history of transient ischemic attack (TIA), and cerebral infarction without residual deficits: Secondary | ICD-10-CM | POA: Diagnosis not present

## 2022-02-16 DIAGNOSIS — Z658 Other specified problems related to psychosocial circumstances: Secondary | ICD-10-CM | POA: Diagnosis not present

## 2022-02-16 DIAGNOSIS — E669 Obesity, unspecified: Secondary | ICD-10-CM | POA: Diagnosis not present

## 2022-02-16 DIAGNOSIS — E7800 Pure hypercholesterolemia, unspecified: Secondary | ICD-10-CM | POA: Diagnosis not present

## 2022-02-16 DIAGNOSIS — G2581 Restless legs syndrome: Secondary | ICD-10-CM | POA: Diagnosis not present

## 2022-02-16 DIAGNOSIS — I10 Essential (primary) hypertension: Secondary | ICD-10-CM | POA: Diagnosis not present

## 2022-02-16 LAB — CUP PACEART REMOTE DEVICE CHECK
Date Time Interrogation Session: 20240122120719
Implantable Pulse Generator Implant Date: 20231219

## 2022-02-17 DIAGNOSIS — J301 Allergic rhinitis due to pollen: Secondary | ICD-10-CM | POA: Diagnosis not present

## 2022-02-17 DIAGNOSIS — J3089 Other allergic rhinitis: Secondary | ICD-10-CM | POA: Diagnosis not present

## 2022-02-17 DIAGNOSIS — J3081 Allergic rhinitis due to animal (cat) (dog) hair and dander: Secondary | ICD-10-CM | POA: Diagnosis not present

## 2022-02-24 DIAGNOSIS — J301 Allergic rhinitis due to pollen: Secondary | ICD-10-CM | POA: Diagnosis not present

## 2022-02-24 DIAGNOSIS — J3081 Allergic rhinitis due to animal (cat) (dog) hair and dander: Secondary | ICD-10-CM | POA: Diagnosis not present

## 2022-02-24 DIAGNOSIS — J3089 Other allergic rhinitis: Secondary | ICD-10-CM | POA: Diagnosis not present

## 2022-03-03 DIAGNOSIS — J301 Allergic rhinitis due to pollen: Secondary | ICD-10-CM | POA: Diagnosis not present

## 2022-03-03 DIAGNOSIS — J3089 Other allergic rhinitis: Secondary | ICD-10-CM | POA: Diagnosis not present

## 2022-03-03 DIAGNOSIS — J3081 Allergic rhinitis due to animal (cat) (dog) hair and dander: Secondary | ICD-10-CM | POA: Diagnosis not present

## 2022-03-11 DIAGNOSIS — I10 Essential (primary) hypertension: Secondary | ICD-10-CM | POA: Diagnosis not present

## 2022-03-12 DIAGNOSIS — J301 Allergic rhinitis due to pollen: Secondary | ICD-10-CM | POA: Diagnosis not present

## 2022-03-12 DIAGNOSIS — J3089 Other allergic rhinitis: Secondary | ICD-10-CM | POA: Diagnosis not present

## 2022-03-12 DIAGNOSIS — J3081 Allergic rhinitis due to animal (cat) (dog) hair and dander: Secondary | ICD-10-CM | POA: Diagnosis not present

## 2022-03-18 DIAGNOSIS — I10 Essential (primary) hypertension: Secondary | ICD-10-CM | POA: Diagnosis not present

## 2022-03-18 DIAGNOSIS — J3081 Allergic rhinitis due to animal (cat) (dog) hair and dander: Secondary | ICD-10-CM | POA: Diagnosis not present

## 2022-03-18 DIAGNOSIS — J3089 Other allergic rhinitis: Secondary | ICD-10-CM | POA: Diagnosis not present

## 2022-03-18 DIAGNOSIS — J301 Allergic rhinitis due to pollen: Secondary | ICD-10-CM | POA: Diagnosis not present

## 2022-03-22 ENCOUNTER — Ambulatory Visit: Payer: Medicare PPO

## 2022-03-22 DIAGNOSIS — I495 Sick sinus syndrome: Secondary | ICD-10-CM | POA: Diagnosis not present

## 2022-03-22 LAB — CUP PACEART REMOTE DEVICE CHECK
Date Time Interrogation Session: 20240224120613
Implantable Pulse Generator Implant Date: 20231219

## 2022-03-25 DIAGNOSIS — J3089 Other allergic rhinitis: Secondary | ICD-10-CM | POA: Diagnosis not present

## 2022-03-25 DIAGNOSIS — J301 Allergic rhinitis due to pollen: Secondary | ICD-10-CM | POA: Diagnosis not present

## 2022-03-25 DIAGNOSIS — J3081 Allergic rhinitis due to animal (cat) (dog) hair and dander: Secondary | ICD-10-CM | POA: Diagnosis not present

## 2022-03-31 ENCOUNTER — Telehealth: Payer: Self-pay | Admitting: Neurology

## 2022-03-31 NOTE — Telephone Encounter (Signed)
Pt states that he is needing to discuss reactions he is having like Leg pain, joint pain trouble sleeping etc ever since he started the atorvastatin (LIPITOR) 40 MG tablet  Please advise.

## 2022-04-02 DIAGNOSIS — J301 Allergic rhinitis due to pollen: Secondary | ICD-10-CM | POA: Diagnosis not present

## 2022-04-02 DIAGNOSIS — J3089 Other allergic rhinitis: Secondary | ICD-10-CM | POA: Diagnosis not present

## 2022-04-02 DIAGNOSIS — J3081 Allergic rhinitis due to animal (cat) (dog) hair and dander: Secondary | ICD-10-CM | POA: Diagnosis not present

## 2022-04-02 NOTE — Progress Notes (Signed)
[facility]link Summary Report / Loop Recorder 

## 2022-04-08 DIAGNOSIS — J3089 Other allergic rhinitis: Secondary | ICD-10-CM | POA: Diagnosis not present

## 2022-04-08 DIAGNOSIS — J3081 Allergic rhinitis due to animal (cat) (dog) hair and dander: Secondary | ICD-10-CM | POA: Diagnosis not present

## 2022-04-08 DIAGNOSIS — J301 Allergic rhinitis due to pollen: Secondary | ICD-10-CM | POA: Diagnosis not present

## 2022-04-12 ENCOUNTER — Other Ambulatory Visit (HOSPITAL_COMMUNITY): Payer: Self-pay

## 2022-04-12 MED ORDER — STUDY - LIBREXIA-STROKE - MILVEXIAN 25 MG OR PLACEBO TABLET (PI-SETHI): 1.0000 | ORAL_TABLET | Freq: Two times a day (BID) | ORAL | 0 refills | Status: DC

## 2022-04-14 DIAGNOSIS — J3081 Allergic rhinitis due to animal (cat) (dog) hair and dander: Secondary | ICD-10-CM | POA: Diagnosis not present

## 2022-04-14 DIAGNOSIS — J3089 Other allergic rhinitis: Secondary | ICD-10-CM | POA: Diagnosis not present

## 2022-04-14 DIAGNOSIS — J301 Allergic rhinitis due to pollen: Secondary | ICD-10-CM | POA: Diagnosis not present

## 2022-04-26 ENCOUNTER — Ambulatory Visit (INDEPENDENT_AMBULATORY_CARE_PROVIDER_SITE_OTHER): Payer: Medicare PPO

## 2022-04-26 DIAGNOSIS — I495 Sick sinus syndrome: Secondary | ICD-10-CM | POA: Diagnosis not present

## 2022-04-26 LAB — CUP PACEART REMOTE DEVICE CHECK
Date Time Interrogation Session: 20240331232020
Implantable Pulse Generator Implant Date: 20231219

## 2022-04-28 DIAGNOSIS — J3081 Allergic rhinitis due to animal (cat) (dog) hair and dander: Secondary | ICD-10-CM | POA: Diagnosis not present

## 2022-04-28 DIAGNOSIS — J3089 Other allergic rhinitis: Secondary | ICD-10-CM | POA: Diagnosis not present

## 2022-04-28 DIAGNOSIS — J301 Allergic rhinitis due to pollen: Secondary | ICD-10-CM | POA: Diagnosis not present

## 2022-04-30 NOTE — Progress Notes (Signed)
[facility]link Summary Report / Loop Recorder 

## 2022-05-06 DIAGNOSIS — J3081 Allergic rhinitis due to animal (cat) (dog) hair and dander: Secondary | ICD-10-CM | POA: Diagnosis not present

## 2022-05-06 DIAGNOSIS — J301 Allergic rhinitis due to pollen: Secondary | ICD-10-CM | POA: Diagnosis not present

## 2022-05-06 DIAGNOSIS — J3089 Other allergic rhinitis: Secondary | ICD-10-CM | POA: Diagnosis not present

## 2022-05-09 ENCOUNTER — Other Ambulatory Visit: Payer: Self-pay | Admitting: Neurology

## 2022-05-12 DIAGNOSIS — J3089 Other allergic rhinitis: Secondary | ICD-10-CM | POA: Diagnosis not present

## 2022-05-12 DIAGNOSIS — J3081 Allergic rhinitis due to animal (cat) (dog) hair and dander: Secondary | ICD-10-CM | POA: Diagnosis not present

## 2022-05-12 DIAGNOSIS — J301 Allergic rhinitis due to pollen: Secondary | ICD-10-CM | POA: Diagnosis not present

## 2022-05-18 DIAGNOSIS — R052 Subacute cough: Secondary | ICD-10-CM | POA: Diagnosis not present

## 2022-05-18 DIAGNOSIS — J3089 Other allergic rhinitis: Secondary | ICD-10-CM | POA: Diagnosis not present

## 2022-05-18 DIAGNOSIS — J301 Allergic rhinitis due to pollen: Secondary | ICD-10-CM | POA: Diagnosis not present

## 2022-05-18 DIAGNOSIS — J3081 Allergic rhinitis due to animal (cat) (dog) hair and dander: Secondary | ICD-10-CM | POA: Diagnosis not present

## 2022-05-24 ENCOUNTER — Encounter: Payer: Self-pay | Admitting: Adult Health

## 2022-05-26 ENCOUNTER — Other Ambulatory Visit: Payer: Self-pay | Admitting: Neurology

## 2022-05-26 DIAGNOSIS — E7800 Pure hypercholesterolemia, unspecified: Secondary | ICD-10-CM | POA: Diagnosis not present

## 2022-05-26 DIAGNOSIS — E291 Testicular hypofunction: Secondary | ICD-10-CM | POA: Diagnosis not present

## 2022-05-26 DIAGNOSIS — R7989 Other specified abnormal findings of blood chemistry: Secondary | ICD-10-CM | POA: Diagnosis not present

## 2022-05-26 DIAGNOSIS — Z125 Encounter for screening for malignant neoplasm of prostate: Secondary | ICD-10-CM | POA: Diagnosis not present

## 2022-05-26 DIAGNOSIS — I10 Essential (primary) hypertension: Secondary | ICD-10-CM | POA: Diagnosis not present

## 2022-05-26 DIAGNOSIS — Z1212 Encounter for screening for malignant neoplasm of rectum: Secondary | ICD-10-CM | POA: Diagnosis not present

## 2022-05-26 MED ORDER — ROSUVASTATIN CALCIUM 5 MG PO TABS: 5.0000 mg | ORAL_TABLET | Freq: Every day | ORAL | 3 refills | Status: DC

## 2022-05-27 DIAGNOSIS — J3089 Other allergic rhinitis: Secondary | ICD-10-CM | POA: Diagnosis not present

## 2022-05-27 DIAGNOSIS — J3081 Allergic rhinitis due to animal (cat) (dog) hair and dander: Secondary | ICD-10-CM | POA: Diagnosis not present

## 2022-05-27 DIAGNOSIS — J301 Allergic rhinitis due to pollen: Secondary | ICD-10-CM | POA: Diagnosis not present

## 2022-05-30 LAB — CUP PACEART REMOTE DEVICE CHECK
Date Time Interrogation Session: 20240503231215
Implantable Pulse Generator Implant Date: 20231219

## 2022-05-31 ENCOUNTER — Ambulatory Visit (INDEPENDENT_AMBULATORY_CARE_PROVIDER_SITE_OTHER): Payer: Medicare PPO

## 2022-05-31 DIAGNOSIS — I495 Sick sinus syndrome: Secondary | ICD-10-CM

## 2022-06-01 NOTE — Progress Notes (Signed)
[facility]link Summary Report / Loop Recorder 

## 2022-06-02 DIAGNOSIS — Z1339 Encounter for screening examination for other mental health and behavioral disorders: Secondary | ICD-10-CM | POA: Diagnosis not present

## 2022-06-02 DIAGNOSIS — G4733 Obstructive sleep apnea (adult) (pediatric): Secondary | ICD-10-CM | POA: Diagnosis not present

## 2022-06-02 DIAGNOSIS — Z8673 Personal history of transient ischemic attack (TIA), and cerebral infarction without residual deficits: Secondary | ICD-10-CM | POA: Diagnosis not present

## 2022-06-02 DIAGNOSIS — R82998 Other abnormal findings in urine: Secondary | ICD-10-CM | POA: Diagnosis not present

## 2022-06-02 DIAGNOSIS — Z0000 Encounter for general adult medical examination without abnormal findings: Secondary | ICD-10-CM | POA: Diagnosis not present

## 2022-06-02 DIAGNOSIS — M720 Palmar fascial fibromatosis [Dupuytren]: Secondary | ICD-10-CM | POA: Diagnosis not present

## 2022-06-02 DIAGNOSIS — Z1331 Encounter for screening for depression: Secondary | ICD-10-CM | POA: Diagnosis not present

## 2022-06-02 DIAGNOSIS — E669 Obesity, unspecified: Secondary | ICD-10-CM | POA: Diagnosis not present

## 2022-06-02 DIAGNOSIS — E7800 Pure hypercholesterolemia, unspecified: Secondary | ICD-10-CM | POA: Diagnosis not present

## 2022-06-03 DIAGNOSIS — J3081 Allergic rhinitis due to animal (cat) (dog) hair and dander: Secondary | ICD-10-CM | POA: Diagnosis not present

## 2022-06-03 DIAGNOSIS — J301 Allergic rhinitis due to pollen: Secondary | ICD-10-CM | POA: Diagnosis not present

## 2022-06-03 DIAGNOSIS — J3089 Other allergic rhinitis: Secondary | ICD-10-CM | POA: Diagnosis not present

## 2022-06-09 DIAGNOSIS — J3081 Allergic rhinitis due to animal (cat) (dog) hair and dander: Secondary | ICD-10-CM | POA: Diagnosis not present

## 2022-06-09 DIAGNOSIS — J301 Allergic rhinitis due to pollen: Secondary | ICD-10-CM | POA: Diagnosis not present

## 2022-06-09 DIAGNOSIS — J3089 Other allergic rhinitis: Secondary | ICD-10-CM | POA: Diagnosis not present

## 2022-06-10 DIAGNOSIS — J3081 Allergic rhinitis due to animal (cat) (dog) hair and dander: Secondary | ICD-10-CM | POA: Diagnosis not present

## 2022-06-10 DIAGNOSIS — J301 Allergic rhinitis due to pollen: Secondary | ICD-10-CM | POA: Diagnosis not present

## 2022-06-10 DIAGNOSIS — J3089 Other allergic rhinitis: Secondary | ICD-10-CM | POA: Diagnosis not present

## 2022-06-17 DIAGNOSIS — J3081 Allergic rhinitis due to animal (cat) (dog) hair and dander: Secondary | ICD-10-CM | POA: Diagnosis not present

## 2022-06-17 DIAGNOSIS — J301 Allergic rhinitis due to pollen: Secondary | ICD-10-CM | POA: Diagnosis not present

## 2022-06-17 DIAGNOSIS — J3089 Other allergic rhinitis: Secondary | ICD-10-CM | POA: Diagnosis not present

## 2022-06-24 DIAGNOSIS — J301 Allergic rhinitis due to pollen: Secondary | ICD-10-CM | POA: Diagnosis not present

## 2022-06-24 DIAGNOSIS — G4733 Obstructive sleep apnea (adult) (pediatric): Secondary | ICD-10-CM | POA: Diagnosis not present

## 2022-06-24 DIAGNOSIS — J3089 Other allergic rhinitis: Secondary | ICD-10-CM | POA: Diagnosis not present

## 2022-06-24 DIAGNOSIS — J3081 Allergic rhinitis due to animal (cat) (dog) hair and dander: Secondary | ICD-10-CM | POA: Diagnosis not present

## 2022-06-28 NOTE — Progress Notes (Signed)
[facility]link Summary Report / Loop Recorder 

## 2022-07-01 DIAGNOSIS — J301 Allergic rhinitis due to pollen: Secondary | ICD-10-CM | POA: Diagnosis not present

## 2022-07-01 DIAGNOSIS — J3089 Other allergic rhinitis: Secondary | ICD-10-CM | POA: Diagnosis not present

## 2022-07-01 DIAGNOSIS — J3081 Allergic rhinitis due to animal (cat) (dog) hair and dander: Secondary | ICD-10-CM | POA: Diagnosis not present

## 2022-07-05 ENCOUNTER — Ambulatory Visit (INDEPENDENT_AMBULATORY_CARE_PROVIDER_SITE_OTHER): Payer: Medicare PPO

## 2022-07-05 DIAGNOSIS — I495 Sick sinus syndrome: Secondary | ICD-10-CM | POA: Diagnosis not present

## 2022-07-05 LAB — CUP PACEART REMOTE DEVICE CHECK
Date Time Interrogation Session: 20240609231300
Implantable Pulse Generator Implant Date: 20231219

## 2022-07-08 DIAGNOSIS — J301 Allergic rhinitis due to pollen: Secondary | ICD-10-CM | POA: Diagnosis not present

## 2022-07-08 DIAGNOSIS — J3081 Allergic rhinitis due to animal (cat) (dog) hair and dander: Secondary | ICD-10-CM | POA: Diagnosis not present

## 2022-07-08 DIAGNOSIS — J3089 Other allergic rhinitis: Secondary | ICD-10-CM | POA: Diagnosis not present

## 2022-07-22 DIAGNOSIS — J301 Allergic rhinitis due to pollen: Secondary | ICD-10-CM | POA: Diagnosis not present

## 2022-07-22 DIAGNOSIS — J3081 Allergic rhinitis due to animal (cat) (dog) hair and dander: Secondary | ICD-10-CM | POA: Diagnosis not present

## 2022-07-22 DIAGNOSIS — J3089 Other allergic rhinitis: Secondary | ICD-10-CM | POA: Diagnosis not present

## 2022-07-26 NOTE — Progress Notes (Signed)
[facility]link Summary Report / Loop Recorder 

## 2022-08-02 DIAGNOSIS — J301 Allergic rhinitis due to pollen: Secondary | ICD-10-CM | POA: Diagnosis not present

## 2022-08-02 DIAGNOSIS — J3089 Other allergic rhinitis: Secondary | ICD-10-CM | POA: Diagnosis not present

## 2022-08-02 DIAGNOSIS — J3081 Allergic rhinitis due to animal (cat) (dog) hair and dander: Secondary | ICD-10-CM | POA: Diagnosis not present

## 2022-08-09 ENCOUNTER — Ambulatory Visit: Payer: Medicare PPO

## 2022-08-09 DIAGNOSIS — I639 Cerebral infarction, unspecified: Secondary | ICD-10-CM

## 2022-08-09 LAB — CUP PACEART REMOTE DEVICE CHECK
Date Time Interrogation Session: 20240712230923
Implantable Pulse Generator Implant Date: 20231219

## 2022-08-12 DIAGNOSIS — J3081 Allergic rhinitis due to animal (cat) (dog) hair and dander: Secondary | ICD-10-CM | POA: Diagnosis not present

## 2022-08-12 DIAGNOSIS — J3089 Other allergic rhinitis: Secondary | ICD-10-CM | POA: Diagnosis not present

## 2022-08-12 DIAGNOSIS — J301 Allergic rhinitis due to pollen: Secondary | ICD-10-CM | POA: Diagnosis not present

## 2022-08-19 DIAGNOSIS — J301 Allergic rhinitis due to pollen: Secondary | ICD-10-CM | POA: Diagnosis not present

## 2022-08-19 DIAGNOSIS — J3081 Allergic rhinitis due to animal (cat) (dog) hair and dander: Secondary | ICD-10-CM | POA: Diagnosis not present

## 2022-08-19 DIAGNOSIS — J3089 Other allergic rhinitis: Secondary | ICD-10-CM | POA: Diagnosis not present

## 2022-08-20 NOTE — Progress Notes (Signed)
[facility]link Summary Report / Loop Recorder 

## 2022-08-26 DIAGNOSIS — J3089 Other allergic rhinitis: Secondary | ICD-10-CM | POA: Diagnosis not present

## 2022-08-26 DIAGNOSIS — J3081 Allergic rhinitis due to animal (cat) (dog) hair and dander: Secondary | ICD-10-CM | POA: Diagnosis not present

## 2022-08-26 DIAGNOSIS — J301 Allergic rhinitis due to pollen: Secondary | ICD-10-CM | POA: Diagnosis not present

## 2022-09-03 DIAGNOSIS — J3081 Allergic rhinitis due to animal (cat) (dog) hair and dander: Secondary | ICD-10-CM | POA: Diagnosis not present

## 2022-09-03 DIAGNOSIS — J3089 Other allergic rhinitis: Secondary | ICD-10-CM | POA: Diagnosis not present

## 2022-09-03 DIAGNOSIS — J301 Allergic rhinitis due to pollen: Secondary | ICD-10-CM | POA: Diagnosis not present

## 2022-09-09 DIAGNOSIS — J3089 Other allergic rhinitis: Secondary | ICD-10-CM | POA: Diagnosis not present

## 2022-09-09 DIAGNOSIS — J3081 Allergic rhinitis due to animal (cat) (dog) hair and dander: Secondary | ICD-10-CM | POA: Diagnosis not present

## 2022-09-09 DIAGNOSIS — J301 Allergic rhinitis due to pollen: Secondary | ICD-10-CM | POA: Diagnosis not present

## 2022-09-13 ENCOUNTER — Ambulatory Visit (INDEPENDENT_AMBULATORY_CARE_PROVIDER_SITE_OTHER): Payer: Medicare PPO

## 2022-09-13 DIAGNOSIS — I639 Cerebral infarction, unspecified: Secondary | ICD-10-CM

## 2022-09-13 LAB — CUP PACEART REMOTE DEVICE CHECK
Date Time Interrogation Session: 20240818231607
Implantable Pulse Generator Implant Date: 20231219

## 2022-09-15 ENCOUNTER — Other Ambulatory Visit: Payer: Self-pay | Admitting: Neurology

## 2022-09-16 DIAGNOSIS — J301 Allergic rhinitis due to pollen: Secondary | ICD-10-CM | POA: Diagnosis not present

## 2022-09-16 DIAGNOSIS — J3081 Allergic rhinitis due to animal (cat) (dog) hair and dander: Secondary | ICD-10-CM | POA: Diagnosis not present

## 2022-09-16 DIAGNOSIS — J3089 Other allergic rhinitis: Secondary | ICD-10-CM | POA: Diagnosis not present

## 2022-09-18 ENCOUNTER — Encounter: Payer: Self-pay | Admitting: Adult Health

## 2022-09-20 MED ORDER — ROSUVASTATIN CALCIUM 5 MG PO TABS: 5.0000 mg | ORAL_TABLET | Freq: Every day | ORAL | 0 refills | Status: DC

## 2022-09-22 DIAGNOSIS — J301 Allergic rhinitis due to pollen: Secondary | ICD-10-CM | POA: Diagnosis not present

## 2022-09-22 DIAGNOSIS — J3081 Allergic rhinitis due to animal (cat) (dog) hair and dander: Secondary | ICD-10-CM | POA: Diagnosis not present

## 2022-09-22 DIAGNOSIS — J3089 Other allergic rhinitis: Secondary | ICD-10-CM | POA: Diagnosis not present

## 2022-09-23 NOTE — Progress Notes (Signed)
[facility]link Summary Report / Loop Recorder 

## 2022-09-30 DIAGNOSIS — J3081 Allergic rhinitis due to animal (cat) (dog) hair and dander: Secondary | ICD-10-CM | POA: Diagnosis not present

## 2022-09-30 DIAGNOSIS — J3089 Other allergic rhinitis: Secondary | ICD-10-CM | POA: Diagnosis not present

## 2022-09-30 DIAGNOSIS — J301 Allergic rhinitis due to pollen: Secondary | ICD-10-CM | POA: Diagnosis not present

## 2022-10-04 DIAGNOSIS — I788 Other diseases of capillaries: Secondary | ICD-10-CM | POA: Diagnosis not present

## 2022-10-04 DIAGNOSIS — L738 Other specified follicular disorders: Secondary | ICD-10-CM | POA: Diagnosis not present

## 2022-10-04 DIAGNOSIS — L814 Other melanin hyperpigmentation: Secondary | ICD-10-CM | POA: Diagnosis not present

## 2022-10-04 DIAGNOSIS — D2261 Melanocytic nevi of right upper limb, including shoulder: Secondary | ICD-10-CM | POA: Diagnosis not present

## 2022-10-04 DIAGNOSIS — L821 Other seborrheic keratosis: Secondary | ICD-10-CM | POA: Diagnosis not present

## 2022-10-14 DIAGNOSIS — J3081 Allergic rhinitis due to animal (cat) (dog) hair and dander: Secondary | ICD-10-CM | POA: Diagnosis not present

## 2022-10-14 DIAGNOSIS — J301 Allergic rhinitis due to pollen: Secondary | ICD-10-CM | POA: Diagnosis not present

## 2022-10-14 DIAGNOSIS — J3089 Other allergic rhinitis: Secondary | ICD-10-CM | POA: Diagnosis not present

## 2022-10-18 ENCOUNTER — Other Ambulatory Visit (HOSPITAL_COMMUNITY): Payer: Self-pay | Admitting: Neurology

## 2022-10-18 ENCOUNTER — Ambulatory Visit (INDEPENDENT_AMBULATORY_CARE_PROVIDER_SITE_OTHER): Payer: Medicare PPO

## 2022-10-18 DIAGNOSIS — I639 Cerebral infarction, unspecified: Secondary | ICD-10-CM

## 2022-10-18 LAB — CUP PACEART REMOTE DEVICE CHECK
Date Time Interrogation Session: 20240920230618
Implantable Pulse Generator Implant Date: 20231219

## 2022-10-18 MED ORDER — STUDY - LIBREXIA-STROKE - MILVEXIAN 25 MG OR PLACEBO TABLET (PI-SETHI): 1.0000 | ORAL_TABLET | Freq: Two times a day (BID) | ORAL | 0 refills | Status: DC

## 2022-10-26 NOTE — Progress Notes (Unsigned)
PATIENT: Tyler [name] DOB: [birth year]  REASON FOR VISIT: follow up HISTORY FROM: patient  No chief complaint on file.   HISTORY OF PRESENT ILLNESS:  GEARLD [name] is a 67 y.o. male with a history of OSA on CPAP. Returns today for follow-up.      10/26/21: Tyler [name] is a 67 year old male with a history of obstructive sleep apnea on CPAP.  He returns today for follow-up.  Reports that CPAP is working well for him.  Reports he does not like to sleep without his CPAP machine.  His download is below Today 10/26/22:  Tyler [name] is a 67 year old male with a history of obstructive sleep apnea on CPAP.  His download is below.  He reports that the CPAP is working well for him.  He recently had surgery on his left foot for hammertoe.  He is in a boot today.  He returns today for evaluation.    10/23/19: Tyler [name] is a 67 year old male with a history of obstructive sleep apnea on CPAP.  His download indicates that he uses machine nightly for compliance of 100%.  He uses machine greater than 4 hours each night.  On average he uses his machine 8 hours and 38 minutes.  His residual AHI is 2.6 on 5 to 12 cm of water.  His leak in the 95th percentile is 13.6 L/min.  He reports that the CPAP is working well for him.  He does not like to sleep without it.  HISTORY 10/23/18:   Tyler [name] is a 67 year old male with a history of obstructive sleep apnea on CPAP.  He returns today for follow-up.  His download indicates that he uses machine nightly for compliance of 100%.  Every night he uses machine greater than 4 hours.  On average he uses his machine 8 hours and 25 minutes.  His residual AHI is 3.2 on 5 to 12 cm of water with EPR of 1.  His leak in the 95th percentile is 12.7 L/min.  He reports that the CPAP is working well for him.  He definitely can tell the benefit.  He returns today for an evaluation.  REVIEW OF SYSTEMS: Out of a complete 14 system review of symptoms, the  patient complains only of the following symptoms, and all other reviewed systems are negative.  FSS 14 ESS 7  ALLERGIES: Allergies  Allergen Reactions   Lisinopril Other (See Comments)    cough   Augmentin [Amoxicillin-Pot Clavulanate] Rash    Has patient had a PCN reaction causing immediate rash, facial/tongue/throat swelling, SOB or lightheadedness with hypotension: Yes Has patient had a PCN reaction causing severe rash involving mucus membranes or skin necrosis: No Has patient had a PCN reaction that required hospitalization: No Has patient had a PCN reaction occurring within the last 10 years: Yes If all of the above answers are "NO", then may proceed with Cephalosporin use.    Doxycycline Rash    HOME MEDICATIONS: Outpatient Medications Prior to Visit  Medication Sig Dispense Refill   ALBUTEROL IN Inhale 1-2 puffs into the lungs every 4 (four) hours as needed (shortness of breath).     aspirin EC 81 MG tablet Take 1 tablet (81 mg total) by mouth daily. Swallow whole. 30 tablet 12   Calcium-Magnesium-Zinc (CAL-MAG-ZINC PO) Take 2 tablets by mouth daily.     Cholecalciferol (VITAMIN D-3 PO) Take 1 tablet by mouth daily.     Cyanocobalamin (B-12 PO) Take 1 tablet by mouth daily.  ferrous sulfate 325 (65 FE) MG tablet Take 325 mg by mouth daily.      fluticasone furoate-vilanterol (BREO ELLIPTA) 200-25 MCG/ACT AEPB Inhale 1 puff into the lungs daily.     hydrochlorothiazide (HYDRODIURIL) 12.5 MG tablet Take 12.5 mg by mouth daily.     Investigational - Study Medication Take 1 tablet by mouth in the morning and at bedtime. Study name: Librexia-Stroke Study Additional study details: Milvexian 25 mg or placebo     levocetirizine (XYZAL) 5 MG tablet Take 5 mg by mouth every evening.     meclizine (ANTIVERT) 12.5 MG tablet Take 1 tablet (12.5 mg total) by mouth 3 (three) times daily as needed for dizziness. 30 tablet 0   montelukast (SINGULAIR) 10 MG tablet Take 10 mg by mouth at  bedtime.     olmesartan (BENICAR) 40 MG tablet Take 40 mg by mouth daily.     prochlorperazine (COMPAZINE) 10 MG tablet Take 1 tablet (10 mg total) by mouth 2 (two) times daily as needed for nausea or vomiting. 10 tablet 0   rosuvastatin (CRESTOR) 5 MG tablet Take 1 tablet (5 mg total) by mouth daily. 90 tablet 0   Study - LIBREXIA-STROKE - milvexian 25 mg or placebo tablet (PI-Sethi) Take 1 tablet by mouth 2 (two) times daily. For Investigational Use Only. Take 1 tablet by mouth twice a day with or without food. Please bring bottle to every visit. Please contact [facility] for any questions or concerns. 280 tablet 0   Tamsulosin HCl (FLOMAX) 0.4 MG CAPS Take 0.4 mg by mouth daily.     No facility-administered medications prior to visit.    PAST MEDICAL HISTORY: Past Medical History:  Diagnosis Date   Arthritis    Asthma    Complication of anesthesia 08/17/2016   pt had urinary retention following last sinus surgery that required him to go to the ED to be in and out [id number]   History of hiatal hernia    Hypertension    Sleep apnea     PAST SURGICAL HISTORY: Past Surgical History:  Procedure Laterality Date   LOOP RECORDER INSERTION N/A 01/12/2022   Procedure: LOOP RECORDER INSERTION;  Surgeon: Regan Lemming, MD;  Location: [facility];  Service: Cardiovascular;  Laterality: N/A;   NASAL SINUS SURGERY  2002   ROTATOR CUFF REPAIR     SEPTOPLASTY WITH ETHMOIDECTOMY, AND MAXILLARY ANTROSTOMY N/A 08/19/2016   Procedure: SEPTOPLASTY WITH ETHMOIDECTOMY, AND MAXILLARY ANTROSTOMY;  Surgeon: Flo Shanks, MD;  Location: [facility] OR;  Service: ENT;  Laterality: N/A;   TURBINATE REDUCTION N/A 08/19/2016   Procedure: BILATERAL TURBINATE REDUCTION;  Surgeon: Flo Shanks, MD;  Location: [facility] OR;  Service: ENT;  Laterality: N/A;   WISDOM TOOTH EXTRACTION      FAMILY HISTORY: Family History  Problem Relation Age of Onset   Heart failure Mother    Aneurysm Father     Colon cancer Neg Hx    Esophageal cancer Neg Hx    Rectal cancer Neg Hx    Stomach cancer Neg Hx    Sleep apnea Neg Hx     SOCIAL HISTORY: Social History   Socioeconomic History   Marital status: Married    Spouse name: Not on file   Number of children: 2   Years of education: Not on file   Highest education level: Not on file  Occupational History   Occupation: Teacher  Tobacco Use   Smoking status: Some Days    Types:  Cigars   Smokeless tobacco: Never   Tobacco comments:    occasional cigar  Vaping Use   Vaping status: Never Used  Substance and Sexual Activity   Alcohol use: Yes    Alcohol/week: 4.0 standard drinks of alcohol    Types: 4 Glasses of wine per week   Drug use: No   Sexual activity: Not on file  Other Topics Concern   Not on file  Social History Narrative   Not on file   Social Determinants of Health   Financial Resource Strain: Not on file  Food Insecurity: Not on file  Transportation Needs: Not on file  Physical Activity: Not on file  Stress: Not on file  Social Connections: Not on file  Intimate Partner Violence: Not on file      PHYSICAL EXAM  There were no vitals filed for this visit.  There is no height or weight on file to calculate BMI.  Generalized: Well developed, in no acute distress  Chest: Lungs clear to auscultation bilaterally  Neurological examination  Mentation: Alert oriented to time, place, history taking. Follows all commands speech and language fluent Cranial nerve II-XII: Extraocular movements were full, visual field were full on confrontational test Head turning and shoulder shrug  were normal and symmetric.  Gait and station: Gait is normal.    DIAGNOSTIC DATA (LABS, IMAGING, TESTING) - I reviewed patient records, labs, notes, testing and imaging myself where available.  Lab Results  Component Value Date   WBC 7.6 01/11/2022   HGB 13.4 01/11/2022   HCT 38.5 (L) 01/11/2022   MCV 84.8 01/11/2022   PLT 197  01/11/2022      Component Value Date/Time   NA 137 01/12/2022 0654   K 3.7 01/12/2022 0654   CL 104 01/12/2022 0654   CO2 23 01/12/2022 0654   GLUCOSE 100 (H) 01/12/2022 0654   BUN 23 01/12/2022 0654   CREATININE 1.10 01/12/2022 0654   CALCIUM 8.2 (L) 01/12/2022 0654   PROT 6.8 01/10/2022 0631   ALBUMIN 3.8 01/10/2022 0631   AST 26 01/10/2022 0631   ALT 23 01/10/2022 0631   ALKPHOS 40 01/10/2022 0631   BILITOT 0.5 01/10/2022 0631   GFRNONAA >60 01/12/2022 0654   GFRAA >60 08/17/2016 0809      ASSESSMENT AND PLAN 67 y.o. year old male  has a past medical history of Arthritis, Asthma, Complication of anesthesia (08/17/2016), History of hiatal hernia, Hypertension, and Sleep apnea. here with:  OSA on CPAP  - CPAP compliance excellent - Good treatment of AHI  - Encourage patient to use CPAP nightly and > 4 hours each night - F/U in 1 year or sooner if needed    Butch Penny, MSN, NP-C 10/26/2022, 5:10 PM [facility] [address], [address] [place], [place] [address] [phone]

## 2022-10-27 ENCOUNTER — Ambulatory Visit: Payer: Medicare PPO | Admitting: Adult Health

## 2022-10-27 ENCOUNTER — Encounter: Payer: Self-pay | Admitting: Adult Health

## 2022-10-27 VITALS — BP 119/75 | HR 64 | Ht 70.0 in | Wt 230.0 lb

## 2022-10-27 DIAGNOSIS — G4733 Obstructive sleep apnea (adult) (pediatric): Secondary | ICD-10-CM | POA: Diagnosis not present

## 2022-10-27 NOTE — Patient Instructions (Signed)
Continue using CPAP nightly and greater than 4 hours each night °If your symptoms worsen or you develop new symptoms please let us know.  ° °

## 2022-10-28 DIAGNOSIS — J3089 Other allergic rhinitis: Secondary | ICD-10-CM | POA: Diagnosis not present

## 2022-10-28 DIAGNOSIS — J301 Allergic rhinitis due to pollen: Secondary | ICD-10-CM | POA: Diagnosis not present

## 2022-10-28 DIAGNOSIS — J3081 Allergic rhinitis due to animal (cat) (dog) hair and dander: Secondary | ICD-10-CM | POA: Diagnosis not present

## 2022-10-29 NOTE — Progress Notes (Signed)
[facility]link Summary Report / Loop Recorder 

## 2022-10-30 DIAGNOSIS — Z23 Encounter for immunization: Secondary | ICD-10-CM | POA: Diagnosis not present

## 2022-11-04 DIAGNOSIS — J3089 Other allergic rhinitis: Secondary | ICD-10-CM | POA: Diagnosis not present

## 2022-11-04 DIAGNOSIS — J3081 Allergic rhinitis due to animal (cat) (dog) hair and dander: Secondary | ICD-10-CM | POA: Diagnosis not present

## 2022-11-04 DIAGNOSIS — J301 Allergic rhinitis due to pollen: Secondary | ICD-10-CM | POA: Diagnosis not present

## 2022-11-18 DIAGNOSIS — J3089 Other allergic rhinitis: Secondary | ICD-10-CM | POA: Diagnosis not present

## 2022-11-18 DIAGNOSIS — J3081 Allergic rhinitis due to animal (cat) (dog) hair and dander: Secondary | ICD-10-CM | POA: Diagnosis not present

## 2022-11-18 DIAGNOSIS — J301 Allergic rhinitis due to pollen: Secondary | ICD-10-CM | POA: Diagnosis not present

## 2022-11-22 ENCOUNTER — Ambulatory Visit (INDEPENDENT_AMBULATORY_CARE_PROVIDER_SITE_OTHER): Payer: Medicare PPO

## 2022-11-22 DIAGNOSIS — I639 Cerebral infarction, unspecified: Secondary | ICD-10-CM | POA: Diagnosis not present

## 2022-11-23 LAB — CUP PACEART REMOTE DEVICE CHECK
Date Time Interrogation Session: 20241027231457
Implantable Pulse Generator Implant Date: 20231219

## 2022-11-26 DIAGNOSIS — R197 Diarrhea, unspecified: Secondary | ICD-10-CM | POA: Diagnosis not present

## 2022-11-26 DIAGNOSIS — R339 Retention of urine, unspecified: Secondary | ICD-10-CM | POA: Diagnosis not present

## 2022-11-26 DIAGNOSIS — R1084 Generalized abdominal pain: Secondary | ICD-10-CM | POA: Diagnosis not present

## 2022-11-26 DIAGNOSIS — R112 Nausea with vomiting, unspecified: Secondary | ICD-10-CM | POA: Diagnosis not present

## 2022-12-02 ENCOUNTER — Emergency Department (HOSPITAL_BASED_OUTPATIENT_CLINIC_OR_DEPARTMENT_OTHER)
Admission: EM | Admit: 2022-12-02 | Discharge: 2022-12-03 | Disposition: A | Discharge status: Home / Self Care | Payer: Medicare PPO | Attending: Emergency Medicine | Admitting: Emergency Medicine

## 2022-12-02 ENCOUNTER — Emergency Department (HOSPITAL_BASED_OUTPATIENT_CLINIC_OR_DEPARTMENT_OTHER): Payer: Medicare PPO

## 2022-12-02 ENCOUNTER — Other Ambulatory Visit: Payer: Self-pay

## 2022-12-02 DIAGNOSIS — Z7982 Long term (current) use of aspirin: Secondary | ICD-10-CM | POA: Insufficient documentation

## 2022-12-02 DIAGNOSIS — R197 Diarrhea, unspecified: Secondary | ICD-10-CM | POA: Insufficient documentation

## 2022-12-02 DIAGNOSIS — I700 Atherosclerosis of aorta: Secondary | ICD-10-CM | POA: Insufficient documentation

## 2022-12-02 DIAGNOSIS — I10 Essential (primary) hypertension: Secondary | ICD-10-CM | POA: Diagnosis not present

## 2022-12-02 DIAGNOSIS — K8020 Calculus of gallbladder without cholecystitis without obstruction: Secondary | ICD-10-CM | POA: Diagnosis not present

## 2022-12-02 DIAGNOSIS — K760 Fatty (change of) liver, not elsewhere classified: Secondary | ICD-10-CM | POA: Diagnosis not present

## 2022-12-02 DIAGNOSIS — K828 Other specified diseases of gallbladder: Secondary | ICD-10-CM | POA: Diagnosis not present

## 2022-12-02 DIAGNOSIS — N400 Enlarged prostate without lower urinary tract symptoms: Secondary | ICD-10-CM | POA: Insufficient documentation

## 2022-12-02 DIAGNOSIS — R1110 Vomiting, unspecified: Secondary | ICD-10-CM | POA: Insufficient documentation

## 2022-12-02 DIAGNOSIS — K4020 Bilateral inguinal hernia, without obstruction or gangrene, not specified as recurrent: Secondary | ICD-10-CM | POA: Diagnosis not present

## 2022-12-02 DIAGNOSIS — R140 Abdominal distension (gaseous): Secondary | ICD-10-CM | POA: Diagnosis not present

## 2022-12-02 DIAGNOSIS — K429 Umbilical hernia without obstruction or gangrene: Secondary | ICD-10-CM | POA: Diagnosis not present

## 2022-12-02 DIAGNOSIS — K838 Other specified diseases of biliary tract: Secondary | ICD-10-CM | POA: Diagnosis not present

## 2022-12-02 DIAGNOSIS — Z79899 Other long term (current) drug therapy: Secondary | ICD-10-CM | POA: Diagnosis not present

## 2022-12-02 DIAGNOSIS — K5730 Diverticulosis of large intestine without perforation or abscess without bleeding: Secondary | ICD-10-CM | POA: Diagnosis not present

## 2022-12-02 LAB — MAGNESIUM: Magnesium: 2.1 mg/dL (ref 1.7–2.4)

## 2022-12-02 LAB — CBC
HCT: 44.9 % (ref 39.0–52.0)
Hemoglobin: 15.4 g/dL (ref 13.0–17.0)
MCH: 28.4 pg (ref 26.0–34.0)
MCHC: 34.3 g/dL (ref 30.0–36.0)
MCV: 82.8 fL (ref 80.0–100.0)
Platelets: 289 10*3/uL (ref 150–400)
RBC: 5.42 MIL/uL (ref 4.22–5.81)
RDW: 14 % (ref 11.5–15.5)
WBC: 7 10*3/uL (ref 4.0–10.5)
nRBC: 0 % (ref 0.0–0.2)

## 2022-12-02 LAB — URINALYSIS, ROUTINE W REFLEX MICROSCOPIC
Bacteria, UA: NONE SEEN
Bilirubin Urine: NEGATIVE
Glucose, UA: NEGATIVE mg/dL
Hgb urine dipstick: NEGATIVE
Nitrite: NEGATIVE
Specific Gravity, Urine: 1.039 — ABNORMAL HIGH (ref 1.005–1.030)
pH: 6 (ref 5.0–8.0)

## 2022-12-02 LAB — COMPREHENSIVE METABOLIC PANEL
ALT: 45 U/L — ABNORMAL HIGH (ref 0–44)
AST: 44 U/L — ABNORMAL HIGH (ref 15–41)
Albumin: 4.4 g/dL (ref 3.5–5.0)
Alkaline Phosphatase: 27 U/L — ABNORMAL LOW (ref 38–126)
Anion gap: 12 (ref 5–15)
BUN: 31 mg/dL — ABNORMAL HIGH (ref 8–23)
CO2: 21 mmol/L — ABNORMAL LOW (ref 22–32)
Calcium: 9.3 mg/dL (ref 8.9–10.3)
Chloride: 107 mmol/L (ref 98–111)
Creatinine, Ser: 1.7 mg/dL — ABNORMAL HIGH (ref 0.61–1.24)
GFR, Estimated: 43 mL/min — ABNORMAL LOW (ref >60)
Glucose, Bld: 122 mg/dL — ABNORMAL HIGH (ref 70–99)
Potassium: 3.2 mmol/L — ABNORMAL LOW (ref 3.5–5.1)
Sodium: 140 mmol/L (ref 135–145)
Total Bilirubin: 0.5 mg/dL (ref <1.2)
Total Protein: 7.3 g/dL (ref 6.5–8.1)

## 2022-12-02 LAB — C DIFFICILE QUICK SCREEN W PCR REFLEX
C Diff antigen: NEGATIVE
C Diff interpretation: NOT DETECTED
C Diff toxin: NEGATIVE

## 2022-12-02 LAB — LIPASE, BLOOD: Lipase: 63 U/L — ABNORMAL HIGH (ref 11–51)

## 2022-12-02 MED ORDER — LOPERAMIDE HCL 2 MG PO CAPS
4.0000 mg | ORAL_CAPSULE | Freq: Once | ORAL | Status: AC
  Administered 2022-12-02: 4 mg via ORAL
  Filled 2022-12-02: qty 2

## 2022-12-02 MED ORDER — POTASSIUM CHLORIDE CRYS ER 20 MEQ PO TBCR
40.0000 meq | EXTENDED_RELEASE_TABLET | Freq: Once | ORAL | Status: AC
  Administered 2022-12-02: 40 meq via ORAL
  Filled 2022-12-02: qty 2

## 2022-12-02 MED ORDER — IOHEXOL 300 MG/ML  SOLN
100.0000 mL | Freq: Once | INTRAMUSCULAR | Status: AC | PRN
  Administered 2022-12-02: 80 mL via INTRAVENOUS

## 2022-12-02 MED ORDER — SODIUM CHLORIDE 0.9 % IV BOLUS
1000.0000 mL | Freq: Once | INTRAVENOUS | Status: AC
  Administered 2022-12-02: 1000 mL via INTRAVENOUS

## 2022-12-02 NOTE — ED Notes (Signed)
Report given to the next RN.Marland KitchenMarland Kitchen

## 2022-12-02 NOTE — ED Provider Notes (Incomplete)
  Physical Exam  BP 114/61 (BP Location: Right Arm)   Pulse 69   Temp 98.6 F (37 C) (Oral)   Resp 16   SpO2 95%   Physical Exam  Procedures  Procedures  ED Course / MDM    Medical Decision Making Amount and/or Complexity of Data Reviewed Labs: ordered. Radiology: ordered.  Risk Prescription drug management.   Patient care handed off from [facility] at shift change.  See prior note for more full details.  In short, 67 year old male presents to the ED with complaints of diarrhea.  Patient been having loose bowel movements for the past 2 weeks.  Was seen by primary care on Monday and prescribed Cipro Flagyl without significant improvement.  Patient subsequently developed nausea as well as emesis and was given Zofran by primary care on Friday which has since improved his nausea as well as vomiting.  Describes bowel movements as watery without obvious mucus or blood/melena.  Denies fever, abdominal pain, urinary symptoms.  Laboratory studies: No leukocytosis.  No evidence of anemia.  Placed within range.  Multiple electrolyte abnormalities including hypokalemia 3.2, decrease in bicarb of 21.  Mild transaminitis at 44 and 45 AST and ALT.  Patient with elevated creatinine 1.7 from baseline around 1.2.  UA with trace leukocytes but negative WBCs, bacteria.  UA also with ketones as well as protein.  C. difficile, stool culture pending.  Imaging studies: Right upper quadrant ultrasound:

## 2022-12-02 NOTE — ED Provider Notes (Signed)
Physical Exam  BP 115/74   Pulse 69   Temp 97.9 F (36.6 C)   Resp 16   SpO2 96%   Physical Exam Vitals and nursing note reviewed.  Constitutional:      General: He is not in acute distress.    Appearance: He is well-developed.  HENT:     Head: Normocephalic and atraumatic.  Eyes:     Conjunctiva/sclera: Conjunctivae normal.  Cardiovascular:     Rate and Rhythm: Normal rate and regular rhythm.     Heart sounds: No murmur heard. Pulmonary:     Effort: Pulmonary effort is normal. No respiratory distress.     Breath sounds: Normal breath sounds.  Abdominal:     Palpations: Abdomen is soft.     Tenderness: There is no abdominal tenderness. There is no guarding.  Musculoskeletal:        General: No swelling.     Cervical back: Neck supple.  Skin:    General: Skin is warm and dry.     Capillary Refill: Capillary refill takes less than 2 seconds.  Neurological:     Mental Status: He is alert.  Psychiatric:        Mood and Affect: Mood normal.     Procedures  Procedures  ED Course / MDM   Clinical Course as of 12/03/22 1214  Thu Dec 02, 2022  2301 Consulted GI Dr. Chales Abrahams who is reassured by CT imaging which is negative for choledocholithiasis or obvious CBD dilation.  Will recommend outpatient follow-up with GI but no urgent/emergent needed for additional imaging. [CR]    Clinical Course User Index [CR] Peter Garter, PA   Medical Decision Making Amount and/or Complexity of Data Reviewed Labs: ordered. Radiology: ordered.  Risk Prescription drug management.   Patient care handed off from [facility] at shift change.  See prior note for more full details.  In short, 67 year old male presents to the ED with complaints of diarrhea.  Patient been having loose bowel movements for the past 2 weeks.  Was seen by primary care on Monday and prescribed Cipro Flagyl without significant improvement.  Patient subsequently developed nausea as well as emesis and was given  Zofran by primary care on Friday which has since improved his nausea as well as vomiting.  Describes bowel movements as watery without obvious mucus or blood/melena.  Denies fever, abdominal pain, urinary symptoms.  Laboratory studies: No leukocytosis.  No evidence of anemia.  Placed within range.  Multiple electrolyte abnormalities including hypokalemia 3.2, decrease in bicarb of 21.  Mild transaminitis at 44 and 45 AST and ALT.  Patient with elevated creatinine 1.7 from baseline around 1.2.  UA with trace leukocytes but negative WBCs, bacteria.  UA also with ketones as well as protein.  C. difficile, stool culture pending.  Imaging studies: Right upper quadrant ultrasound: Sludge and stones in gallbladder without evidence of cholecystitis.  Distended CBD measuring up to 10.8 mm.  Hepatic steatosis. CT abdomen pelvis: Diffusely fluid-filled colon and occasional fluid-filled small bowel consistent with diarrheal illness.  Colonic wall thickening with pericolonic edema.  Moderate size bilateral fat-containing inguinal hernias.  Aortic atherosclerosis  Diarrhea Vital signs within normal range and stable throughout visit Laboratory/imaging studies significant for: See above 67 year old male presents to the emergency department with complaints of diarrhea for the past 2 weeks.  Patient has been on empiric treatment of diarrheal illness versus diverticulitis with Cipro and Flagyl by primary care provider since Monday.  Patient did develop nausea  and vomiting that seem to be controlled with Zofran all prescribed by primary care provider.  On exam, patient without any abdominal tenderness.  At shift change, plan was to assess patient's symptoms after antidiarrheal as well as after imaging studies have resulted.  Ultrasound of patient's right upper quadrant was obtained given mild transaminitis which was concerning for CBD dilation as well as gallbladder stones/sludge present.  Consulted GI regarding this finding  given the patient has normal total bilirubin, without right upper quadrant abdominal pain, fever, jaundice and normal CT imaging of patient's CBD, lower suspicion for CBD pathology.  Recommendation was for outpatient follow-up regarding finding.  Regarding patient's diarrheal illness for the past 2 weeks.  Already on antibiotics in the form of Cipro and Flagyl.  Patient does report history of being given Cipro in the past and causing diarrhea when he is being treated for a different infection in the past that he is unable to remember.  This could be worsening patient's diarrheal illness given history of adverse effect in the past.  Patient negative for C. difficile but awaiting additional stool sample results.  Patient's labs also showed evidence of mild AKI with elevation of creatinine at 1.7 from baseline of 1.2.  Treated with 1 L normal saline the patient subsequently tolerating p.o.  Will recommend continued use of broad-spectrum antibiotics given CT imaging concerning for colitis/diverticulitis.  Additional symptomatic therapy recommended as described in AVS.  Treatment plan discussed at length with patient and he knowledge understanding was agreeable to said plan.  Patient overall well-appearing, afebrile in no acute distress.   Worrisome signs and symptoms were discussed with the patient and the patient knowledge understanding to return if notice.  Patient stable upon discharge.    Peter Garter, [place] 12/03/22 1214    Rozelle Logan, DO 12/09/22 [phone]

## 2022-12-02 NOTE — ED Provider Notes (Signed)
[place] EMERGENCY DEPARTMENT AT [facility] Provider Note   CSN: [id number] Arrival date & time: 12/02/22  1537     History Chief Complaint  Patient presents with   Emesis    Tyler [name] is a 67 y.o. male history of hypertension who presents to the emergency department today for further evaluation of diarrhea for the last 2 weeks.  Patient was seen and evaluated by his PCP and was started on Cipro and Flagyl on Monday.  He states that this has not offered much improvement.  Was also started on Zofran on Friday when he saw his PCP.  He reports nausea and vomiting initially but that is since resolved apart from having 1 episode of vomiting today.  He states that he is having pretty constant watery diarrhea without any blood or mucus.  He denies any abdominal pain, fever currently, chills, urinary symptoms.   Emesis      Home Medications Prior to Admission medications   Medication Sig Start Date End Date Taking? Authorizing Provider  ALBUTEROL IN Inhale 1-2 puffs into the lungs every 4 (four) hours as needed (shortness of breath).    [provider]  aspirin EC 81 MG tablet Take 1 tablet (81 mg total) by mouth daily. Swallow whole. 01/13/22   Regalado, Belkys A, MD  Calcium-Magnesium-Zinc (CAL-MAG-ZINC PO) Take 2 tablets by mouth daily.    [provider]  Cholecalciferol (VITAMIN D-3 PO) Take 1 tablet by mouth daily.    [provider]  Cyanocobalamin (B-12 PO) Take 1 tablet by mouth daily.    [provider]  ferrous sulfate 325 (65 FE) MG tablet Take 325 mg by mouth daily.     [provider]  fluticasone furoate-vilanterol (BREO ELLIPTA) 200-25 MCG/ACT AEPB Inhale 1 puff into the lungs daily.    [provider]  hydrochlorothiazide (HYDRODIURIL) 12.5 MG tablet Take 12.5 mg by mouth daily.    [provider]  Investigational - Study Medication Take 1 tablet by mouth in the morning and at bedtime. Study  name: Librexia-Stroke Study Additional study details: Milvexian 25 mg or placebo    [provider]  levocetirizine (XYZAL) 5 MG tablet Take 5 mg by mouth every evening.    [provider]  montelukast (SINGULAIR) 10 MG tablet Take 10 mg by mouth at bedtime. 02/18/18   [provider]  olmesartan (BENICAR) 40 MG tablet Take 40 mg by mouth daily. 09/25/21   [provider]  rosuvastatin (CRESTOR) 5 MG tablet Take 1 tablet (5 mg total) by mouth daily. 09/20/22   Butch Penny, NP  Study - LIBREXIA-STROKE - milvexian 25 mg or placebo tablet (PI-Sethi) Take 1 tablet by mouth 2 (two) times daily. For Investigational Use Only. Take 1 tablet by mouth twice a day with or without food. Please bring bottle to every visit. Please contact [facility] for any questions or concerns. 10/18/22   Micki Riley, MD  Tamsulosin HCl (FLOMAX) 0.4 MG CAPS Take 0.4 mg by mouth daily.    [provider]      Allergies    Lisinopril, Augmentin [amoxicillin-pot clavulanate], and Doxycycline    Review of Systems   Review of Systems  Gastrointestinal:  Positive for vomiting.  All other systems reviewed and are negative.   Physical Exam Updated Vital Signs BP 102/69 (BP Location: Right Arm)   Pulse 99   Temp 98.6 F (37 C) (Oral)   Resp 16   SpO2 94%  Physical Exam Vitals and nursing note reviewed.  Constitutional:      General: He is not in acute distress.    Appearance: Normal appearance.  HENT:     Head: Normocephalic and atraumatic.  Eyes:     General:        Right eye: No discharge.        Left eye: No discharge.  Cardiovascular:     Comments: Regular rate and rhythm.  S1/S2 are distinct without any evidence of murmur, rubs, or gallops.  Radial pulses are 2+ bilaterally.  Dorsalis pedis pulses are 2+ bilaterally.  No evidence of pedal edema. Pulmonary:     Comments: Clear to auscultation bilaterally.  Normal effort.  No respiratory  distress.  No evidence of wheezes, rales, or rhonchi heard throughout. Abdominal:     General: Abdomen is flat. Bowel sounds are normal. There is no distension.     Tenderness: There is no abdominal tenderness. There is no guarding or rebound.  Musculoskeletal:        General: Normal range of motion.     Cervical back: Neck supple.  Skin:    General: Skin is warm and dry.     Findings: No rash.  Neurological:     General: No focal deficit present.     Mental Status: He is alert.  Psychiatric:        Mood and Affect: Mood normal.        Behavior: Behavior normal.     ED Results / Procedures / Treatments   Labs (all labs ordered are listed, but only abnormal results are displayed) Labs Reviewed  LIPASE, BLOOD - Abnormal; Notable for the following components:      Result Value   Lipase 63 (*)    All other components within normal limits  COMPREHENSIVE METABOLIC PANEL - Abnormal; Notable for the following components:   Potassium 3.2 (*)    CO2 21 (*)    Glucose, Bld 122 (*)    BUN 31 (*)    Creatinine, Ser 1.70 (*)    AST 44 (*)    ALT 45 (*)    Alkaline Phosphatase 27 (*)    GFR, Estimated 43 (*)    All other components within normal limits  URINALYSIS, ROUTINE W REFLEX MICROSCOPIC - Abnormal; Notable for the following components:   Specific Gravity, Urine 1.039 (*)    Ketones, ur TRACE (*)    Protein, ur TRACE (*)    Leukocytes,Ua TRACE (*)    All other components within normal limits  STOOL CULTURE  C DIFFICILE QUICK SCREEN W PCR REFLEX    CBC    EKG None  Radiology No results found.  Procedures Procedures    Medications Ordered in ED Medications  sodium chloride 0.9 % bolus 1,000 mL (1,000 mLs Intravenous New Bag/Given 12/02/22 1849)    ED Course/ Medical Decision Making/ A&P   {   Click here for ABCD2, HEART and other calculators  Medical Decision Making Tyler [name] is a 67 y.o. male patient who presents to the emergency department today  for further evaluation of 2 weeks worth of diarrhea and some associated vomiting.  Labs were ordered in triage interpreted by myself.  The patient does have evidence of transaminitis and elevated lipase.  I will likely get a right upper quadrant ultrasound to further investigate the transaminitis.  Patient is going to give a stool sample here so we will send that off for C. difficile testing and stool  culture.  Patient is not having any significant abdominal pain.  Vital signs are normal.  Emina give him a liter of fluids as well.  Due to shift change, the patient's care will be transferred to oncoming provider. If RUQ [place] doesn't give any clear reason as to why the patient has a transaminitis may consider CT abdomen pelvis with contrast to further assess. Disposition to be made by oncoming provider.    Amount and/or Complexity of Data Reviewed Labs: ordered. Radiology: ordered.    Final Clinical Impression(s) / ED Diagnoses Final diagnoses:  None    Rx / DC Orders ED Discharge Orders     None         Teressa Lower, PA-C 12/02/22 1853    Rozelle Logan, DO 12/03/22 0024

## 2022-12-02 NOTE — Discharge Instructions (Addendum)
As discussed, recommend continued brat diet as well as nausea medicine as needed.  Will send in additional nausea medicine if you run out of your home Zofran.  Will also send in potassium to take for the next several days.  Recommend electrolyte rich fluid supplementation in the form of Gatorade, Pedialyte, liquid IV, Body Armor.  Will send in referral for GI for follow-up.  Follow MyChart for the results of your stool culture.  Please not hesitate to return if the worrisome signs and symptoms we discussed become apparent.

## 2022-12-02 NOTE — ED Triage Notes (Signed)
2 weeks vomiting and diarrhea. PCP Friday- started zofran. Monday started flagyl, cipro. Continues to have symptoms with no improvement.   CVA last December- no deficits.

## 2022-12-03 DIAGNOSIS — J302 Other seasonal allergic rhinitis: Secondary | ICD-10-CM | POA: Diagnosis not present

## 2022-12-03 DIAGNOSIS — K760 Fatty (change of) liver, not elsewhere classified: Secondary | ICD-10-CM | POA: Diagnosis not present

## 2022-12-03 DIAGNOSIS — G4733 Obstructive sleep apnea (adult) (pediatric): Secondary | ICD-10-CM | POA: Diagnosis not present

## 2022-12-03 DIAGNOSIS — E7800 Pure hypercholesterolemia, unspecified: Secondary | ICD-10-CM | POA: Diagnosis not present

## 2022-12-03 DIAGNOSIS — Z8673 Personal history of transient ischemic attack (TIA), and cerebral infarction without residual deficits: Secondary | ICD-10-CM | POA: Diagnosis not present

## 2022-12-03 DIAGNOSIS — M720 Palmar fascial fibromatosis [Dupuytren]: Secondary | ICD-10-CM | POA: Diagnosis not present

## 2022-12-03 DIAGNOSIS — K529 Noninfective gastroenteritis and colitis, unspecified: Secondary | ICD-10-CM | POA: Diagnosis not present

## 2022-12-03 DIAGNOSIS — E669 Obesity, unspecified: Secondary | ICD-10-CM | POA: Diagnosis not present

## 2022-12-03 MED ORDER — POTASSIUM CHLORIDE CRYS ER 20 MEQ PO TBCR: 20.0000 meq | EXTENDED_RELEASE_TABLET | Freq: Every day | ORAL | 0 refills | Status: DC

## 2022-12-03 MED ORDER — ONDANSETRON 4 MG PO TBDP: 4.0000 mg | ORAL_TABLET | Freq: Three times a day (TID) | ORAL | 0 refills | Status: DC | PRN

## 2022-12-03 NOTE — ED Notes (Signed)
Reviewed AVS with patient, patient expressed understanding of directions, denies further questions at this time. 

## 2022-12-07 LAB — STOOL CULTURE REFLEX - RSASHR

## 2022-12-07 LAB — STOOL CULTURE REFLEX - CMPCXR

## 2022-12-07 LAB — STOOL CULTURE: E coli, Shiga toxin Assay: NEGATIVE

## 2022-12-08 ENCOUNTER — Encounter: Payer: Self-pay | Admitting: Gastroenterology

## 2022-12-08 ENCOUNTER — Ambulatory Visit: Payer: Medicare PPO | Admitting: Gastroenterology

## 2022-12-08 VITALS — BP 106/70 | HR 76 | Ht 70.0 in | Wt 212.0 lb

## 2022-12-08 DIAGNOSIS — K529 Noninfective gastroenteritis and colitis, unspecified: Secondary | ICD-10-CM | POA: Diagnosis not present

## 2022-12-08 DIAGNOSIS — R197 Diarrhea, unspecified: Secondary | ICD-10-CM | POA: Insufficient documentation

## 2022-12-08 MED ORDER — DIPHENOXYLATE-ATROPINE 2.5-0.025 MG PO TABS: 1.0000 | ORAL_TABLET | Freq: Two times a day (BID) | ORAL | 1 refills | Status: DC | PRN

## 2022-12-08 NOTE — Progress Notes (Signed)
12/08/2022 Tyler [name] [id number] [birth year]   HISTORY OF PRESENT ILLNESS: This is a 67 year old male who was previously patient of Dr. Marzetta Board.  He has not been seen here since July 2014 at which time he had a colonoscopy that showed a 2 mm polyp that was hyperplastic on pathology.  Also had moderate diverticulosis.  He is here today with complaints of diarrhea.  He tells me that on Saturday, October 25 he tried to sit down to eat lunch and started having vomiting and diarrhea.  He waited it out a couple days at least thinking that it was a virus or gastroenteritis of some sort.  Then saw his PCP at some point they treated him with Cipro and Flagyl for 10 days.  That did not help.  He uses Pepto-Bismol which helps some.  Imodium did not help at all.  He has been having more than 10 bowel movements a day, up to 10 times at night with bowel movements.  No blood in the stool.  Says that prior to all this he was having completely normal bowel movements.  He denies seeing any blood in the stool.  Having some lower abdominal cramping, but was not necessarily having any pain in particular to begin with.  He went to the ED last week and stool for C. difficile and culture were negative.  CT scan of the abdomen and pelvis with contrast showed the following:  IMPRESSION: 1. Diffusely fluid-filled colon and occasional fluid-filled small bowel, consistent with diarrheal illness. Mild colonic wall thickening with faint pericolonic edema at the junction of the descending and sigmoid colon, may represent mild focal colitis or diverticulitis. 2. Multiple additional noninflamed colonic diverticula. 3. Hepatic steatosis. 4. Enlarged prostate with mass effect on the bladder base. 5. Moderate size bilateral fat containing inguinal hernias. Small fat containing umbilical hernia.   Aortic Atherosclerosis (ICD10-I70.0).  Right upper quadrant abdominal ultrasound slowed sludge and stones in the  gallbladder without evidence of acute cholecystitis.  Also showed hepatic steatosis and distended, bile duct measuring 10.8 mm.  LFTs are normal.  Lipase elevated at 63.  Potassium was 3.2 and they gave him potassium to take for 4 days.  Has also been experiencing reflux recently since all this began as well.  Does not have issues with reflux historically.  Past Medical History:  Diagnosis Date   Arthritis    Asthma    Complication of anesthesia 08/17/2016   pt had urinary retention following last sinus surgery that required him to go to the ED to be in and out [id number]   History of hiatal hernia    Hypertension    Sleep apnea    Stroke [facility])    Past Surgical History:  Procedure Laterality Date   LOOP RECORDER INSERTION N/A 01/12/2022   Procedure: LOOP RECORDER INSERTION;  Surgeon: Regan Lemming, MD;  Location: [facility] INVASIVE CV LAB;  Service: Cardiovascular;  Laterality: N/A;   NASAL SINUS SURGERY  2002   ROTATOR CUFF REPAIR     SEPTOPLASTY WITH ETHMOIDECTOMY, AND MAXILLARY ANTROSTOMY N/A 08/19/2016   Procedure: SEPTOPLASTY WITH ETHMOIDECTOMY, AND MAXILLARY ANTROSTOMY;  Surgeon: Flo Shanks, MD;  Location[facility];  Service: ENT;  Laterality: N/A;   TURBINATE REDUCTION N/A 08/19/2016   Procedure: BILATERAL TURBINATE REDUCTION;  Surgeon: Flo Shanks, MD;  Location: [facility] OR;  Service: ENT;  Laterality: N/A;   WISDOM TOOTH EXTRACTION      reports that he has been smoking cigars. He has never  used smokeless tobacco. He reports current alcohol use of about 4.0 standard drinks of alcohol per week. He reports that he does not use drugs. family history includes Aneurysm in his father; Heart failure in his mother. Allergies  Allergen Reactions   Lisinopril Other (See Comments)    cough   Augmentin [Amoxicillin-Pot Clavulanate] Rash    Has patient had a PCN reaction causing immediate rash, facial/tongue/throat swelling, SOB or lightheadedness with hypotension: Yes Has patient had a  PCN reaction causing severe rash involving mucus membranes or skin necrosis: No Has patient had a PCN reaction that required hospitalization: No Has patient had a PCN reaction occurring within the last 10 years: Yes If all of the above answers are "NO", then may proceed with Cephalosporin use.    Doxycycline Rash      Outpatient Encounter Medications as of 12/08/2022  Medication Sig   albuterol (VENTOLIN HFA) 108 (90 Base) MCG/ACT inhaler Inhale 1 puff into the lungs as needed.   ALBUTEROL IN Inhale 1-2 puffs into the lungs every 4 (four) hours as needed (shortness of breath).   amLODipine (NORVASC) 10 MG tablet Take 10 mg by mouth daily.   aspirin EC 81 MG tablet Take 1 tablet (81 mg total) by mouth daily. Swallow whole.   Calcium-Magnesium-Zinc (CAL-MAG-ZINC PO) Take 2 tablets by mouth daily.   Cholecalciferol (VITAMIN D-3 PO) Take 1 tablet by mouth daily.   Cyanocobalamin (B-12 PO) Take 1 tablet by mouth daily.   ferrous sulfate 325 (65 FE) MG tablet Take 325 mg by mouth daily.    fluticasone furoate-vilanterol (BREO ELLIPTA) 200-25 MCG/ACT AEPB Inhale 1 puff into the lungs daily.   hydrochlorothiazide (HYDRODIURIL) 12.5 MG tablet Take 12.5 mg by mouth daily.   Investigational - Study Medication Take 1 tablet by mouth in the morning and at bedtime. Study name: Librexia-Stroke Study Additional study details: Milvexian 25 mg or placebo   levocetirizine (XYZAL) 5 MG tablet Take 5 mg by mouth every evening.   montelukast (SINGULAIR) 10 MG tablet Take 10 mg by mouth at bedtime.   olmesartan (BENICAR) 40 MG tablet Take 40 mg by mouth daily.   ondansetron (ZOFRAN-ODT) 4 MG disintegrating tablet Take 1 tablet (4 mg total) by mouth every 8 (eight) hours as needed.   potassium chloride SA (KLOR-CON M) 20 MEQ tablet Take 1 tablet (20 mEq total) by mouth daily.   rosuvastatin (CRESTOR) 5 MG tablet Take 1 tablet (5 mg total) by mouth daily.   sildenafil (VIAGRA) 50 MG tablet Take 50 mg by mouth as  needed.   Study - LIBREXIA-STROKE - milvexian 25 mg or placebo tablet (PI-Sethi) Take 1 tablet by mouth 2 (two) times daily. For Investigational Use Only. Take 1 tablet by mouth twice a day with or without food. Please bring bottle to every visit. Please contact [facility] for any questions or concerns.   Tamsulosin HCl (FLOMAX) 0.4 MG CAPS Take 0.4 mg by mouth daily.   No facility-administered encounter medications on file as of 12/08/2022.    REVIEW OF SYSTEMS  : All other systems reviewed and negative except where noted in the History of Present Illness.   PHYSICAL EXAM: BP 106/70   Pulse 76   Ht 5\' 10"  (1.778 m)   Wt 212 lb (96.2 kg)   BMI 30.42 kg/m  General: Well developed white male in no acute distress Head: Normocephalic and atraumatic Eyes:  Sclerae anicteric, conjunctiva pink. Ears: Normal auditory acuity Lungs: Clear throughout to auscultation; no  W/R/R. Heart: Regular rate and rhythm; no M/R/G. Abdomen: Soft, non-distended.  BS present and hyperactive.  Mild TTP. Rectal:  Will be done at the time of colonoscopy. Musculoskeletal: Symmetrical with no gross deformities  Skin: No lesions on visible extremities Extremities: No edema  Neurological: Alert oriented x 4, grossly non-focal Psychological:  Alert and cooperative. Normal mood and affect  ASSESSMENT AND PLAN: *Diarrhea that began suddenly in onset on 10/25.  Also initially had vomiting with it as well.  No improvement with a course of Cipro and Flagyl for 10 days.  No improvement with Pepto-Bismol or Imodium.  Stool studies negative.  CT scan last week showed diffusely fluid-filled colon and occasional fluid-filled small bowel consistent with diarrheal illness with mild colonic wall thickening with faint pericolonic edema at the junction of the descending and sigmoid colon may represent mild focal colitis or diverticulitis.  Story initially suspicious for infectious colitis.  His main complaint is  persistent severe diarrhea.  He was treated with a course of Cipro and Flagyl.  Pain is not a huge complaint, more just lower cramping.  I think we need to try to proceed with colonoscopy to rule out IBD, etc.  Will schedule with Dr. Meridee Score.  The risks, benefits, and alternatives to colonoscopy were discussed with the patient and he consents to proceed.  Will prescribe Lomotil to use to try to manage his symptoms in the interim.  Prescription sent to pharmacy. *GERD: Just really having reflux recently all of this began.  Will begin Pepcid 20 mg OTC twice daily. *Distended, bile duct measuring 10.8 mm but ultrasound.  LFTs are normal.  Lipase 63 last week in the setting of acute illness.  Will have Dr. Meridee Score review, but may want to proceed with the MRCP that was recommended once he is improved from this other illness.   CC:  Tisovec, Adelfa Koh, MD

## 2022-12-08 NOTE — Patient Instructions (Addendum)
We have sent the following medications to your pharmacy for you to pick up at your convenience: Lomotil every 8-12 hours as needed for diarrhea.  Please purchase the following medications over the counter and take as directed: Pepcid 20 mg OTC twice daily.    You have been scheduled for a colonoscopy. Please follow written instructions given to you at your visit today.   Please pick up your prep supplies at the pharmacy within the next 1-3 days.  If you use inhalers (even only as needed), please bring them with you on the day of your procedure.  DO NOT TAKE 7 DAYS PRIOR TO TEST- Trulicity (dulaglutide) Ozempic, Wegovy (semaglutide) Mounjaro (tirzepatide) Bydureon Bcise (exanatide extended release)  DO NOT TAKE 1 DAY PRIOR TO YOUR TEST Rybelsus (semaglutide) Adlyxin (lixisenatide) Victoza (liraglutide) Byetta (exanatide) ________________________________________________________________  _______________________________________________________  If your blood pressure at your visit was 140/90 or greater, please contact your primary care physician to follow up on this.  _______________________________________________________  If you are age 26 or older, your body mass index should be between 23-30. Your Body mass index is 30.42 kg/m. If this is out of the aforementioned range listed, please consider follow up with your Primary Care Provider.  If you are age 64 or younger, your body mass index should be between 19-25. Your Body mass index is 30.42 kg/m. If this is out of the aformentioned range listed, please consider follow up with your Primary Care Provider.   ________________________________________________________  The [facility] providers would like to encourage you to use [facility] to communicate with providers for non-urgent requests or questions.  Due to long hold times on the telephone, sending your provider a message by [facility] may be a faster and more efficient way to get a  response.  Please allow 48 business hours for a response.  Please remember that this is for non-urgent requests.  _______________________________________________________

## 2022-12-09 ENCOUNTER — Telehealth: Payer: Self-pay

## 2022-12-09 ENCOUNTER — Telehealth: Payer: Self-pay | Admitting: *Deleted

## 2022-12-09 DIAGNOSIS — K838 Other specified diseases of biliary tract: Secondary | ICD-10-CM

## 2022-12-09 NOTE — Progress Notes (Signed)
Attending Physician's Attestation   I have reviewed the chart.   I agree with the Advanced Practitioner's note, impression, and recommendations with any updates as below. We just ensure that we have a negative C. difficile PCR done ASAP to ensure no evidence of C. difficile before we proceed with colonoscopy.  Otherwise agree with diagnostic colonoscopy.  In regards to the biliary dilatation with the patient having normal LFTs, MRI/MRCP would be neck step in evaluation.  That may be ordered when he is ready and agreeable.   Corliss Parish, MD [facility] Advanced Endoscopy Office # [phone]

## 2022-12-09 NOTE — Telephone Encounter (Signed)
-----   Message from [facility] sent at 12/09/2022  5:40 AM EST -----    ----- Message ----- From: Leta Baptist, PA-C Sent: 12/08/2022   4:54 PM EST To: Lemar Lofty., MD

## 2022-12-09 NOTE — Progress Notes (Signed)
[facility]link Summary Report / Loop Recorder 

## 2022-12-09 NOTE — Telephone Encounter (Signed)
I spoke with the pt and made him aware that he will need MRI in a few weeks after the colon. He will setup for sometime the end of Dec first of Jan. He will keep appt as planned for colon.

## 2022-12-09 NOTE — Telephone Encounter (Signed)
If C. Difficile was already negative within the last few weeks, then it does not need to be resent. Thanks. GM

## 2022-12-09 NOTE — Telephone Encounter (Signed)
-----   Message from Munford D. Zehr sent at 12/08/2022  4:57 PM EST ----- Thank you.  From what I understand his stroke was last year.  Will plan to instruct him to hold it for 3 days prior to the procedure and resume postop as we think acceptable. ----- Message ----- From: Micki Riley, MD Sent: 12/08/2022   4:56 PM EST To: Leta Baptist, PA-C  Yes he is in the [place] stroke trial and is either getting a factor XI inhibitor which is a blood thinner or placebo.  I typically defer elective procedures for at least 3 months after the stroke.  You may hold the study medication 3 days prior to the procedure with small but acceptable periprocedural risk of stroke/TIA if patient is willing to resume the occasion after procedure when safe ----- Message ----- From: Leta Baptist, PA-C Sent: 12/08/2022  10:11 AM EST To: Micki Riley, MD  Hello.  This patient is on an investigational medication.  He is having a colonoscopy so I just need to know if that is a blood thinning medication that would need to be held prior to a procedure.  Thank you for your help!  Doug Sou

## 2022-12-09 NOTE — Telephone Encounter (Signed)
Patient informed he may hold study medication for 3 days prior to procedure.

## 2022-12-09 NOTE — Telephone Encounter (Signed)
Author: Mansouraty, Netty Starring., MD Service: Gastroenterology Author Type: Physician  Filed: 12/09/2022  5:41 AM Encounter Date: 12/08/2022 Status: Signed  Editor: Mansouraty, Netty Starring., MD (Physician)    Attending Physician's Attestation    I have reviewed the chart.    I agree with the Advanced Practitioner's note, impression, and recommendations with any updates as below. We just ensure that we have a negative C. difficile PCR done ASAP to ensure no evidence of C. difficile before we proceed with colonoscopy.  Otherwise agree with diagnostic colonoscopy.  In regards to the biliary dilatation with the patient having normal LFTs, MRI/MRCP would be neck step in evaluation.  That may be ordered when he is ready and agreeable.     Corliss Parish, MD [facility] Advanced Endoscopy Office # [phone]

## 2022-12-09 NOTE — Telephone Encounter (Signed)
C diff was negative on 11/7 does he need another test?  MRi MRCP order has been entered and sent to the schedulers.

## 2022-12-14 ENCOUNTER — Other Ambulatory Visit: Payer: Self-pay

## 2022-12-14 ENCOUNTER — Encounter: Payer: Self-pay | Admitting: Gastroenterology

## 2022-12-14 ENCOUNTER — Ambulatory Visit: Payer: Medicare PPO | Admitting: Gastroenterology

## 2022-12-14 VITALS — BP 104/71 | HR 60 | Temp 97.5°F | Resp 14 | Ht 70.0 in | Wt 212.0 lb

## 2022-12-14 DIAGNOSIS — K635 Polyp of colon: Secondary | ICD-10-CM | POA: Diagnosis not present

## 2022-12-14 DIAGNOSIS — J45909 Unspecified asthma, uncomplicated: Secondary | ICD-10-CM | POA: Diagnosis not present

## 2022-12-14 DIAGNOSIS — K644 Residual hemorrhoidal skin tags: Secondary | ICD-10-CM | POA: Diagnosis not present

## 2022-12-14 DIAGNOSIS — R933 Abnormal findings on diagnostic imaging of other parts of digestive tract: Secondary | ICD-10-CM

## 2022-12-14 DIAGNOSIS — K642 Third degree hemorrhoids: Secondary | ICD-10-CM

## 2022-12-14 DIAGNOSIS — R197 Diarrhea, unspecified: Secondary | ICD-10-CM | POA: Diagnosis not present

## 2022-12-14 DIAGNOSIS — K529 Noninfective gastroenteritis and colitis, unspecified: Secondary | ICD-10-CM

## 2022-12-14 DIAGNOSIS — K5730 Diverticulosis of large intestine without perforation or abscess without bleeding: Secondary | ICD-10-CM | POA: Diagnosis not present

## 2022-12-14 DIAGNOSIS — G4730 Sleep apnea, unspecified: Secondary | ICD-10-CM | POA: Diagnosis not present

## 2022-12-14 DIAGNOSIS — I10 Essential (primary) hypertension: Secondary | ICD-10-CM | POA: Diagnosis not present

## 2022-12-14 DIAGNOSIS — D122 Benign neoplasm of ascending colon: Secondary | ICD-10-CM

## 2022-12-14 MED ORDER — DIPHENOXYLATE-ATROPINE 2.5-0.025 MG PO TABS: 1.0000 | ORAL_TABLET | Freq: Four times a day (QID) | ORAL | 2 refills | Status: DC

## 2022-12-14 MED ORDER — SODIUM CHLORIDE 0.9 % IV SOLN: 500.0000 mL | Freq: Once | INTRAVENOUS | Status: DC

## 2022-12-14 NOTE — Op Note (Signed)
[facility] Patient Name: Tyler [name] Procedure Date: 12/14/2022 11:56 AM MRN: [id number] Endoscopist: Corliss Parish , MD, [id number] Age: 67 Referring MD:  Date of Birth: Mar 24, 1955 Gender: Male Account #: [id number] Procedure:                Colonoscopy Indications:              Clinically significant diarrhea of unexplained                            origin, Abnormal CT of the GI tract, Weight loss Medicines:                Monitored Anesthesia Care Procedure:                Pre-Anesthesia Assessment:                           - Prior to the procedure, a History and Physical                            was performed, and patient medications and                            allergies were reviewed. The patient's tolerance of                            previous anesthesia was also reviewed. The risks                            and benefits of the procedure and the sedation                            options and risks were discussed with the patient.                            All questions were answered, and informed consent                            was obtained. Prior Anticoagulants: The patient has                            taken no anticoagulant or antiplatelet agents                            except for aspirin. ASA Grade Assessment: III - A                            patient with severe systemic disease. After                            reviewing the risks and benefits, the patient was                            deemed in satisfactory condition to undergo the  procedure.                           After obtaining informed consent, the colonoscope                            was passed under direct vision. Throughout the                            procedure, the patient's blood pressure, pulse, and                            oxygen saturations were monitored continuously. The                            Olympus Scope SN [id number] was  introduced through the                            anus and advanced to the 5 cm into the ileum. The                            colonoscopy was performed without difficulty. The                            patient tolerated the procedure. The quality of the                            bowel preparation was adequate. The terminal ileum,                            ileocecal valve, appendiceal orifice, and rectum                            were photographed. Scope In: 12:15:45 PM Scope Out: 12:35:16 PM Scope Withdrawal Time: 0 hours 16 minutes 58 seconds  Total Procedure Duration: 0 hours 19 minutes 31 seconds  Findings:                 The digital rectal exam findings include                            hemorrhoids. Pertinent negatives include no                            palpable rectal lesions.                           The terminal ileum and ileocecal valve appeared                            normal. Biopsies were taken with a cold forceps for                            histology.  A 3 mm polyp was found in the ascending colon. The                            polyp was sessile. The polyp was removed with a                            cold snare. Resection and retrieval were complete.                           Many medium-mouthed and small-mouthed diverticula                            were found in the recto-sigmoid colon, sigmoid                            colon and descending colon.                           There was a very minimal amount of granularity                            within the cecal base but otherwise normal mucosa                            was found in the entire colon. Biopsies were taken                            with a cold forceps for histology from the right                            colon. Biopsies were taken with a cold forceps for                            histology from the left colon. Biopsies were taken                            with  a cold forceps for histology from the rectum.                           Non-bleeding non-thrombosed prolapsed internal                            hemorrhoids were found during retroflexion, during                            perianal exam and during digital exam. The                            hemorrhoids were Grade III (internal hemorrhoids                            that prolapse but require manual reduction). Complications:  No immediate complications. Estimated Blood Loss:     Estimated blood loss was minimal. Impression:               - Hemorrhoids found on digital rectal exam.                           - The examined portion of the ileum was normal.                            Biopsied.                           - One 3 mm polyp in the ascending colon, removed                            with a cold snare. Resected and retrieved.                           - Diverticulosis in the recto-sigmoid colon, in the                            sigmoid colon and in the descending colon.                           - Very minimal granularity in the cecal base                            otherwise normal mucosa in the entire examined                            colon. Biopsied right/left/rectum.                           - Non-bleeding non-thrombosed prolapsed internal                            hemorrhoids. Recommendation:           - The patient will be observed post-procedure,                            until all discharge criteria are met.                           - Discharge patient to home.                           - Patient has a contact number available for                            emergencies. The signs and symptoms of potential                            delayed complications were discussed with the  patient. Return to normal activities tomorrow.                            Written discharge instructions were provided to the                             patient.                           - Resume previous diet.                           - Increse Lomotil dosing 1-2 tablets up to 4 times                            a day (New Rx sent to pharmacy).                           - Metamucil 1-2 times daily to bulk stool.                           - Consideration of SIBO breath testing is                            reasonable.                           - The possibility of a postviral irritable bowel                            diarrhea should also be considered. Xifaxan therapy                            for IBS diarrhea may be reasonable to consider if                            negative pathology.                           - Fecal calprotectin and fecal elastase testing is                            reasonable as well to consider.                           - Await pathology results.                           - Repeat colonoscopy for surveillance based on                            pathology results.                           - If evidence of any chronic colitis is noted, will  consider ASA therapy versus steroid therapy.                           - Repeat blood tests including a CBC/CMP/ESR/CRP                            are reasonable to see and make sure patient has not                            become more dehydrated. This can be done later this                            week.                           - The findings and recommendations were discussed                            with the patient.                           - The findings and recommendations were discussed                            with the patient's family. Corliss Parish, MD 12/14/2022 12:47:04 PM

## 2022-12-14 NOTE — Progress Notes (Signed)
Pt's states no medical or surgical changes since previsit or office visit. 

## 2022-12-14 NOTE — Patient Instructions (Signed)
Thank you for letting us take care of your healthcare needs today! Please see handouts regarding Polyps, Hemorrhoids, and Diverticulosis. Per Dr. Meridee Score, Increase Lomotil to 1-2 tablets up to 4 times a day. Also, use Metamucil (powder) 1-2 times daily. Pick up stool kit before discharge (collect Thursday and return). Obtain labs ordered.   YOU HAD AN ENDOSCOPIC PROCEDURE TODAY AT THE [facility]:   Refer to the procedure report that was given to you for any specific questions about what was found during the examination.  If the procedure report does not answer your questions, please call your gastroenterologist to clarify.  If you requested that your care partner not be given the details of your procedure findings, then the procedure report has been included in a sealed envelope for you to review at your convenience later.  YOU SHOULD EXPECT: Some feelings of bloating in the abdomen. Passage of more gas than usual.  Walking can help get rid of the air that was put into your GI tract during the procedure and reduce the bloating. If you had a lower endoscopy (such as a colonoscopy or flexible sigmoidoscopy) you may notice spotting of blood in your stool or on the toilet paper. If you underwent a bowel prep for your procedure, you may not have a normal bowel movement for a few days.  Please Note:  You might notice some irritation and congestion in your nose or some drainage.  This is from the oxygen used during your procedure.  There is no need for concern and it should clear up in a day or so.  SYMPTOMS TO REPORT IMMEDIATELY:  Following lower endoscopy (colonoscopy or flexible sigmoidoscopy):  Excessive amounts of blood in the stool  Significant tenderness or worsening of abdominal pains  Swelling of the abdomen that is new, acute  Fever of 100F or higher  For urgent or emergent issues, a gastroenterologist can be reached at any hour by calling [phone]. Do not use MyChart  messaging for urgent concerns.    DIET:  We do recommend a small meal at first, but then you may proceed to your regular diet.  Drink plenty of fluids but you should avoid alcoholic beverages for 24 hours.  ACTIVITY:  You should plan to take it easy for the rest of today and you should NOT DRIVE or use heavy machinery until tomorrow (because of the sedation medicines used during the test).    FOLLOW UP: Our staff will call the number listed on your records the next business day following your procedure.  We will call around 7:15- 8:00 am to check on you and address any questions or concerns that you may have regarding the information given to you following your procedure. If we do not reach you, we will leave a message.     If any biopsies were taken you will be contacted by phone or by letter within the next 1-3 weeks.  Please call us at [phone] if you have not heard about the biopsies in 3 weeks.    SIGNATURES/CONFIDENTIALITY: You and/or your care partner have signed paperwork which will be entered into your electronic medical record.  These signatures attest to the fact that that the information above on your After Visit Summary has been reviewed and is understood.  Full responsibility of the confidentiality of this discharge information lies with you and/or your care-partner.

## 2022-12-14 NOTE — Progress Notes (Signed)
Called to room to assist during endoscopic procedure.  Patient ID and intended procedure confirmed with present staff. Received instructions for my participation in the procedure from the performing physician.  

## 2022-12-14 NOTE — Progress Notes (Signed)
GASTROENTEROLOGY PROCEDURE H&P NOTE   Primary Care Physician: Gaspar Garbe, MD  HPI: Tyler [name] is a 67 y.o. male who presents for Colonoscopy for diagnostic purposes in setting of diarrhea and abnormal imaging rule out IBD.  Past Medical History:  Diagnosis Date   Arthritis    Asthma    Complication of anesthesia 08/17/2016   pt had urinary retention following last sinus surgery that required him to go to the ED to be in and out [id number]   History of hiatal hernia    Hypertension    Sleep apnea    Stroke [facility])    Past Surgical History:  Procedure Laterality Date   LOOP RECORDER INSERTION N/A 01/12/2022   Procedure: LOOP RECORDER INSERTION;  Surgeon: Regan Lemming, MD;  Location: [facility] INVASIVE CV LAB;  Service: Cardiovascular;  Laterality: N/A;   NASAL SINUS SURGERY  2002   ROTATOR CUFF REPAIR     SEPTOPLASTY WITH ETHMOIDECTOMY, AND MAXILLARY ANTROSTOMY N/A 08/19/2016   Procedure: SEPTOPLASTY WITH ETHMOIDECTOMY, AND MAXILLARY ANTROSTOMY;  Surgeon: Flo Shanks, MD;  Location: [facility] OR;  Service: ENT;  Laterality: N/A;   TURBINATE REDUCTION N/A 08/19/2016   Procedure: BILATERAL TURBINATE REDUCTION;  Surgeon: Flo Shanks, MD;  Location: [facility] OR;  Service: ENT;  Laterality: N/A;   WISDOM TOOTH EXTRACTION     Current Outpatient Medications  Medication Sig Dispense Refill   albuterol (VENTOLIN HFA) 108 (90 Base) MCG/ACT inhaler Inhale 1 puff into the lungs as needed.     ALBUTEROL IN Inhale 1-2 puffs into the lungs every 4 (four) hours as needed (shortness of breath).     amLODipine (NORVASC) 10 MG tablet Take 10 mg by mouth daily.     aspirin EC 81 MG tablet Take 1 tablet (81 mg total) by mouth daily. Swallow whole. 30 tablet 12   Calcium-Magnesium-Zinc (CAL-MAG-ZINC PO) Take 2 tablets by mouth daily.     Cholecalciferol (VITAMIN D-3 PO) Take 1 tablet by mouth daily.     Cyanocobalamin (B-12 PO) Take 1 tablet by mouth daily.     diphenoxylate-atropine  (LOMOTIL) 2.5-0.025 MG tablet Take 1 tablet by mouth 2 (two) times daily as needed for diarrhea or loose stools. 30 tablet 1   ferrous sulfate 325 (65 FE) MG tablet Take 325 mg by mouth daily.      fluticasone furoate-vilanterol (BREO ELLIPTA) 200-25 MCG/ACT AEPB Inhale 1 puff into the lungs daily.     hydrochlorothiazide (HYDRODIURIL) 12.5 MG tablet Take 12.5 mg by mouth daily.     Investigational - Study Medication Take 1 tablet by mouth in the morning and at bedtime. Study name: Librexia-Stroke Study Additional study details: Milvexian 25 mg or placebo     levocetirizine (XYZAL) 5 MG tablet Take 5 mg by mouth every evening.     montelukast (SINGULAIR) 10 MG tablet Take 10 mg by mouth at bedtime.     olmesartan (BENICAR) 40 MG tablet Take 40 mg by mouth daily.     ondansetron (ZOFRAN-ODT) 4 MG disintegrating tablet Take 1 tablet (4 mg total) by mouth every 8 (eight) hours as needed. 20 tablet 0   potassium chloride SA (KLOR-CON M) 20 MEQ tablet Take 1 tablet (20 mEq total) by mouth daily. 4 tablet 0   rosuvastatin (CRESTOR) 5 MG tablet Take 1 tablet (5 mg total) by mouth daily. 90 tablet 0   sildenafil (VIAGRA) 50 MG tablet Take 50 mg by mouth as needed.     Study - LIBREXIA-STROKE -  milvexian 25 mg or placebo tablet (PI-Sethi) Take 1 tablet by mouth 2 (two) times daily. For Investigational Use Only. Take 1 tablet by mouth twice a day with or without food. Please bring bottle to every visit. Please contact [facility] for any questions or concerns. 280 tablet 0   Tamsulosin HCl (FLOMAX) 0.4 MG CAPS Take 0.4 mg by mouth daily.     No current facility-administered medications for this visit.    Current Outpatient Medications:    albuterol (VENTOLIN HFA) 108 (90 Base) MCG/ACT inhaler, Inhale 1 puff into the lungs as needed., Disp: , Rfl:    ALBUTEROL IN, Inhale 1-2 puffs into the lungs every 4 (four) hours as needed (shortness of breath)., Disp: , Rfl:    amLODipine (NORVASC) 10  MG tablet, Take 10 mg by mouth daily., Disp: , Rfl:    aspirin EC 81 MG tablet, Take 1 tablet (81 mg total) by mouth daily. Swallow whole., Disp: 30 tablet, Rfl: 12   Calcium-Magnesium-Zinc (CAL-MAG-ZINC PO), Take 2 tablets by mouth daily., Disp: , Rfl:    Cholecalciferol (VITAMIN D-3 PO), Take 1 tablet by mouth daily., Disp: , Rfl:    Cyanocobalamin (B-12 PO), Take 1 tablet by mouth daily., Disp: , Rfl:    diphenoxylate-atropine (LOMOTIL) 2.5-0.025 MG tablet, Take 1 tablet by mouth 2 (two) times daily as needed for diarrhea or loose stools., Disp: 30 tablet, Rfl: 1   ferrous sulfate 325 (65 FE) MG tablet, Take 325 mg by mouth daily. , Disp: , Rfl:    fluticasone furoate-vilanterol (BREO ELLIPTA) 200-25 MCG/ACT AEPB, Inhale 1 puff into the lungs daily., Disp: , Rfl:    hydrochlorothiazide (HYDRODIURIL) 12.5 MG tablet, Take 12.5 mg by mouth daily., Disp: , Rfl:    Investigational - Study Medication, Take 1 tablet by mouth in the morning and at bedtime. Study name: Librexia-Stroke Study Additional study details: Milvexian 25 mg or placebo, Disp: , Rfl:    levocetirizine (XYZAL) 5 MG tablet, Take 5 mg by mouth every evening., Disp: , Rfl:    montelukast (SINGULAIR) 10 MG tablet, Take 10 mg by mouth at bedtime., Disp: , Rfl:    olmesartan (BENICAR) 40 MG tablet, Take 40 mg by mouth daily., Disp: , Rfl:    ondansetron (ZOFRAN-ODT) 4 MG disintegrating tablet, Take 1 tablet (4 mg total) by mouth every 8 (eight) hours as needed., Disp: 20 tablet, Rfl: 0   potassium chloride SA (KLOR-CON M) 20 MEQ tablet, Take 1 tablet (20 mEq total) by mouth daily., Disp: 4 tablet, Rfl: 0   rosuvastatin (CRESTOR) 5 MG tablet, Take 1 tablet (5 mg total) by mouth daily., Disp: 90 tablet, Rfl: 0   sildenafil (VIAGRA) 50 MG tablet, Take 50 mg by mouth as needed., Disp: , Rfl:    Study - LIBREXIA-STROKE - milvexian 25 mg or placebo tablet (PI-Sethi), Take 1 tablet by mouth 2 (two) times daily. For Investigational Use Only. Take  1 tablet by mouth twice a day with or without food. Please bring bottle to every visit. Please contact [facility] for any questions or concerns., Disp: 280 tablet, Rfl: 0   Tamsulosin HCl (FLOMAX) 0.4 MG CAPS, Take 0.4 mg by mouth daily., Disp: , Rfl:  Allergies  Allergen Reactions   Lisinopril Other (See Comments)    cough   Augmentin [Amoxicillin-Pot Clavulanate] Rash    Has patient had a PCN reaction causing immediate rash, facial/tongue/throat swelling, SOB or lightheadedness with hypotension: Yes Has patient had a PCN reaction  causing severe rash involving mucus membranes or skin necrosis: No Has patient had a PCN reaction that required hospitalization: No Has patient had a PCN reaction occurring within the last 10 years: Yes If all of the above answers are "NO", then may proceed with Cephalosporin use.    Doxycycline Rash   Family History  Problem Relation Age of Onset   Heart failure Mother    Aneurysm Father    Colon cancer Neg Hx    Esophageal cancer Neg Hx    Rectal cancer Neg Hx    Stomach cancer Neg Hx    Sleep apnea Neg Hx    Social History   Socioeconomic History   Marital status: Married    Spouse name: Not on file   Number of children: 2   Years of education: Not on file   Highest education level: Not on file  Occupational History   Occupation: Teacher  Tobacco Use   Smoking status: Some Days    Types: Cigars   Smokeless tobacco: Never   Tobacco comments:    occasional cigar  Vaping Use   Vaping status: Never Used  Substance and Sexual Activity   Alcohol use: Yes    Alcohol/week: 4.0 standard drinks of alcohol    Types: 4 Glasses of wine per week   Drug use: No   Sexual activity: Not on file  Other Topics Concern   Not on file  Social History Narrative   Not on file   Social Determinants of Health   Financial Resource Strain: Not on file  Food Insecurity: Not on file  Transportation Needs: Not on file  Physical Activity: Not  on file  Stress: Not on file  Social Connections: Not on file  Intimate Partner Violence: Not on file    Physical Exam: There were no vitals filed for this visit. There is no height or weight on file to calculate BMI. GEN: NAD EYE: Sclerae anicteric ENT: MMM CV: Non-tachycardic GI: Soft, NT/ND NEURO:  Alert & Oriented x 3  Lab Results: No results for input(s): "WBC", "HGB", "HCT", "PLT" in the last 72 hours. BMET No results for input(s): "NA", "K", "CL", "CO2", "GLUCOSE", "BUN", "CREATININE", "CALCIUM" in the last 72 hours. LFT No results for input(s): "PROT", "ALBUMIN", "AST", "ALT", "ALKPHOS", "BILITOT", "BILIDIR", "IBILI" in the last 72 hours. PT/INR No results for input(s): "LABPROT", "INR" in the last 72 hours.   Impression / Plan: This is a 67 y.o.male who presents for Colonoscopy for diagnostic purposes in setting of diarrhea and abnormal imaging rule out IBD.  The risks and benefits of endoscopic evaluation/treatment were discussed with the patient and/or family; these include but are not limited to the risk of perforation, infection, bleeding, missed lesions, lack of diagnosis, severe illness requiring hospitalization, as well as anesthesia and sedation related illnesses.  The patient's history has been reviewed, patient examined, no change in status, and deemed stable for procedure.  The patient and/or family is agreeable to proceed.    Corliss Parish, MD [facility] Advanced Endoscopy Office # [phone]

## 2022-12-14 NOTE — Progress Notes (Signed)
A/O x 3, gd SR's, VSS, report to RN

## 2022-12-15 ENCOUNTER — Telehealth: Payer: Self-pay

## 2022-12-15 LAB — SURGICAL PATHOLOGY

## 2022-12-15 NOTE — Telephone Encounter (Signed)
  Follow up Call-     12/14/2022   11:29 AM  Call back number  Post procedure Call Back phone  # [phone]  Permission to leave phone message Yes     Patient questions:  Do you have a fever, pain , or abdominal swelling? No. Pain Score  0 *  Have you tolerated food without any problems? Yes.    Have you been able to return to your normal activities? Yes.    Do you have any questions about your discharge instructions: Diet   No. Medications  No. Follow up visit  No.  Do you have questions or concerns about your Care? No.  Actions: * If pain score is 4 or above: No action needed, pain <4.

## 2022-12-16 ENCOUNTER — Ambulatory Visit (HOSPITAL_COMMUNITY): Payer: Medicare PPO

## 2022-12-16 ENCOUNTER — Encounter: Payer: Self-pay | Admitting: Gastroenterology

## 2022-12-16 ENCOUNTER — Other Ambulatory Visit (INDEPENDENT_AMBULATORY_CARE_PROVIDER_SITE_OTHER): Payer: Medicare PPO

## 2022-12-16 DIAGNOSIS — R197 Diarrhea, unspecified: Secondary | ICD-10-CM

## 2022-12-16 DIAGNOSIS — K529 Noninfective gastroenteritis and colitis, unspecified: Secondary | ICD-10-CM

## 2022-12-16 LAB — CBC
HCT: 41.3 % (ref 39.0–52.0)
Hemoglobin: 13.8 g/dL (ref 13.0–17.0)
MCHC: 33.6 g/dL (ref 30.0–36.0)
MCV: 85.1 fL (ref 78.0–100.0)
Platelets: 262 10*3/uL (ref 150.0–400.0)
RBC: 4.85 Mil/uL (ref 4.22–5.81)
RDW: 14.7 % (ref 11.5–15.5)
WBC: 6.8 10*3/uL (ref 4.0–10.5)

## 2022-12-16 LAB — COMPREHENSIVE METABOLIC PANEL
ALT: 30 U/L (ref 0–53)
AST: 22 U/L (ref 0–37)
Albumin: 3.7 g/dL (ref 3.5–5.2)
Alkaline Phosphatase: 47 U/L (ref 39–117)
BUN: 20 mg/dL (ref 6–23)
CO2: 24 meq/L (ref 19–32)
Calcium: 8.1 mg/dL — ABNORMAL LOW (ref 8.4–10.5)
Chloride: 110 meq/L (ref 96–112)
Creatinine, Ser: 1.7 mg/dL — ABNORMAL HIGH (ref 0.40–1.50)
GFR: 41.04 mL/min — ABNORMAL LOW (ref >60.00)
Glucose, Bld: 111 mg/dL — ABNORMAL HIGH (ref 70–99)
Potassium: 2.9 meq/L — ABNORMAL LOW (ref 3.5–5.1)
Sodium: 139 meq/L (ref 135–145)
Total Bilirubin: 0.5 mg/dL (ref 0.2–1.2)
Total Protein: 6.2 g/dL (ref 6.0–8.3)

## 2022-12-16 LAB — SEDIMENTATION RATE: Sed Rate: 17 mm/h (ref 0–20)

## 2022-12-16 LAB — C-REACTIVE PROTEIN: CRP: <1 mg/dL (ref 0.5–20.0)

## 2022-12-17 ENCOUNTER — Encounter: Payer: Self-pay | Admitting: Gastroenterology

## 2022-12-17 DIAGNOSIS — J301 Allergic rhinitis due to pollen: Secondary | ICD-10-CM | POA: Diagnosis not present

## 2022-12-17 DIAGNOSIS — J3081 Allergic rhinitis due to animal (cat) (dog) hair and dander: Secondary | ICD-10-CM | POA: Diagnosis not present

## 2022-12-17 DIAGNOSIS — J3089 Other allergic rhinitis: Secondary | ICD-10-CM | POA: Diagnosis not present

## 2022-12-18 LAB — CALPROTECTIN, FECAL: Calprotectin, Fecal: 166 ug/g — ABNORMAL HIGH (ref 0–120)

## 2022-12-20 ENCOUNTER — Other Ambulatory Visit: Payer: Self-pay

## 2022-12-20 ENCOUNTER — Other Ambulatory Visit: Payer: Self-pay | Admitting: Anesthesiology

## 2022-12-20 MED ORDER — ROSUVASTATIN CALCIUM 5 MG PO TABS: 5.0000 mg | ORAL_TABLET | Freq: Every day | ORAL | 0 refills | Status: AC

## 2022-12-20 MED ORDER — POTASSIUM CHLORIDE CRYS ER 20 MEQ PO TBCR: 20.0000 meq | EXTENDED_RELEASE_TABLET | Freq: Two times a day (BID) | ORAL | 0 refills | Status: DC

## 2022-12-20 NOTE — Telephone Encounter (Signed)
Dr Meridee Score the pt is asking if he needs to worry about his fecal calprotectin result. Please advise

## 2022-12-21 ENCOUNTER — Other Ambulatory Visit: Payer: Self-pay

## 2022-12-21 DIAGNOSIS — K529 Noninfective gastroenteritis and colitis, unspecified: Secondary | ICD-10-CM

## 2022-12-21 DIAGNOSIS — R195 Other fecal abnormalities: Secondary | ICD-10-CM

## 2022-12-21 DIAGNOSIS — R197 Diarrhea, unspecified: Secondary | ICD-10-CM

## 2022-12-21 DIAGNOSIS — K838 Other specified diseases of biliary tract: Secondary | ICD-10-CM

## 2022-12-21 MED ORDER — RIFAXIMIN 550 MG PO TABS: 550.0000 mg | ORAL_TABLET | Freq: Three times a day (TID) | ORAL | 0 refills | Status: DC

## 2022-12-21 NOTE — Telephone Encounter (Signed)
Separate MyChart message reply to be seen. GM

## 2022-12-21 NOTE — Addendum Note (Signed)
Addended by: Loretha Stapler on: 12/21/2022 09:09 AM   Modules accepted: Orders

## 2022-12-21 NOTE — Telephone Encounter (Signed)
Patty, Here is the plan of action. As the fecal calprotectin still remains elevated and his diarrhea seems to continue to be significant, additional workup is reasonable. I would recommend that and the possibility of underlying SIBO versus IBS-D, that a round of antibiotic Xifaxan therapy is given.  SIBO breath testing should be considered but since we are going to do a round of Xifaxan, we can hold on that currently.  I would set the patient up for a video capsule endoscopy next available to evaluate his small bowel for possible enteritis in the setting of elevated fecal calprotectin and significant diarrhea that he is still having. Xifaxan 550 mg 3 times daily x 14 days IBS-D diagnosis. Other therapies that we may consider may include cholestyramine therapy. Thanks. GM

## 2022-12-22 ENCOUNTER — Encounter: Payer: Self-pay | Admitting: Gastroenterology

## 2022-12-22 DIAGNOSIS — K529 Noninfective gastroenteritis and colitis, unspecified: Secondary | ICD-10-CM

## 2022-12-22 DIAGNOSIS — J3089 Other allergic rhinitis: Secondary | ICD-10-CM | POA: Diagnosis not present

## 2022-12-22 DIAGNOSIS — K838 Other specified diseases of biliary tract: Secondary | ICD-10-CM

## 2022-12-22 DIAGNOSIS — J301 Allergic rhinitis due to pollen: Secondary | ICD-10-CM | POA: Diagnosis not present

## 2022-12-22 DIAGNOSIS — J3081 Allergic rhinitis due to animal (cat) (dog) hair and dander: Secondary | ICD-10-CM | POA: Diagnosis not present

## 2022-12-22 DIAGNOSIS — R195 Other fecal abnormalities: Secondary | ICD-10-CM

## 2022-12-22 DIAGNOSIS — R197 Diarrhea, unspecified: Secondary | ICD-10-CM

## 2022-12-25 LAB — PANCREATIC ELASTASE, FECAL: Pancreatic Elastase-1, Stool: 94 ug/g — ABNORMAL LOW (ref >200)

## 2022-12-26 LAB — CUP PACEART REMOTE DEVICE CHECK
Date Time Interrogation Session: 20241129230654
Implantable Pulse Generator Implant Date: 20231219

## 2022-12-27 ENCOUNTER — Ambulatory Visit: Payer: Medicare PPO

## 2022-12-27 DIAGNOSIS — I639 Cerebral infarction, unspecified: Secondary | ICD-10-CM

## 2022-12-28 ENCOUNTER — Other Ambulatory Visit: Payer: Self-pay

## 2022-12-28 ENCOUNTER — Other Ambulatory Visit (INDEPENDENT_AMBULATORY_CARE_PROVIDER_SITE_OTHER): Payer: Medicare PPO

## 2022-12-28 DIAGNOSIS — K838 Other specified diseases of biliary tract: Secondary | ICD-10-CM

## 2022-12-28 DIAGNOSIS — R195 Other fecal abnormalities: Secondary | ICD-10-CM

## 2022-12-28 DIAGNOSIS — K529 Noninfective gastroenteritis and colitis, unspecified: Secondary | ICD-10-CM | POA: Diagnosis not present

## 2022-12-28 DIAGNOSIS — R197 Diarrhea, unspecified: Secondary | ICD-10-CM | POA: Diagnosis not present

## 2022-12-28 LAB — CBC WITH DIFFERENTIAL/PLATELET
Basophils Absolute: 0 10*3/uL (ref 0.0–0.1)
Basophils Relative: 0.6 % (ref 0.0–3.0)
Eosinophils Absolute: 0.3 10*3/uL (ref 0.0–0.7)
Eosinophils Relative: 5.1 % — ABNORMAL HIGH (ref 0.0–5.0)
HCT: 38.5 % — ABNORMAL LOW (ref 39.0–52.0)
Hemoglobin: 12.8 g/dL — ABNORMAL LOW (ref 13.0–17.0)
Lymphocytes Relative: 24 % (ref 12.0–46.0)
Lymphs Abs: 1.2 10*3/uL (ref 0.7–4.0)
MCHC: 33.4 g/dL (ref 30.0–36.0)
MCV: 85.7 fL (ref 78.0–100.0)
Monocytes Absolute: 0.6 10*3/uL (ref 0.1–1.0)
Monocytes Relative: 12.7 % — ABNORMAL HIGH (ref 3.0–12.0)
Neutro Abs: 2.8 10*3/uL (ref 1.4–7.7)
Neutrophils Relative %: 57.6 % (ref 43.0–77.0)
Platelets: 220 10*3/uL (ref 150.0–400.0)
RBC: 4.49 Mil/uL (ref 4.22–5.81)
RDW: 14.8 % (ref 11.5–15.5)
WBC: 4.9 10*3/uL (ref 4.0–10.5)

## 2022-12-28 LAB — BASIC METABOLIC PANEL
BUN: 21 mg/dL (ref 6–23)
CO2: 31 meq/L (ref 19–32)
Calcium: 8.6 mg/dL (ref 8.4–10.5)
Chloride: 102 meq/L (ref 96–112)
Creatinine, Ser: 1.26 mg/dL (ref 0.40–1.50)
GFR: 58.78 mL/min — ABNORMAL LOW (ref >60.00)
Glucose, Bld: 103 mg/dL — ABNORMAL HIGH (ref 70–99)
Potassium: 3.7 meq/L (ref 3.5–5.1)
Sodium: 141 meq/L (ref 135–145)

## 2022-12-28 LAB — TSH: TSH: 1.18 u[IU]/mL (ref 0.35–5.50)

## 2022-12-28 NOTE — Addendum Note (Signed)
Addended by: Loretha Stapler on: 12/28/2022 09:37 AM   Modules accepted: Orders

## 2022-12-28 NOTE — Telephone Encounter (Signed)
Patty, Have patient come in for labs this week as able. CBC/BMP/TSH Thanks. GM

## 2022-12-29 ENCOUNTER — Telehealth: Payer: Self-pay

## 2022-12-29 ENCOUNTER — Other Ambulatory Visit: Payer: Self-pay

## 2022-12-29 DIAGNOSIS — R197 Diarrhea, unspecified: Secondary | ICD-10-CM

## 2022-12-29 DIAGNOSIS — R195 Other fecal abnormalities: Secondary | ICD-10-CM

## 2022-12-29 DIAGNOSIS — K838 Other specified diseases of biliary tract: Secondary | ICD-10-CM

## 2022-12-29 NOTE — Telephone Encounter (Signed)
Pharmacy Patient Advocate Encounter   Received notification from CoverMyMeds that prior authorization for Xifaxan 550 mg tablets is required/requested.   Insurance verification completed.   The patient is insured through [employer] .   Per test claim: PA required; PA submitted to above mentioned insurance via CoverMyMeds Key/confirmation #/EOC [id number] Status is pending

## 2022-12-30 DIAGNOSIS — J3089 Other allergic rhinitis: Secondary | ICD-10-CM | POA: Diagnosis not present

## 2022-12-30 DIAGNOSIS — J3081 Allergic rhinitis due to animal (cat) (dog) hair and dander: Secondary | ICD-10-CM | POA: Diagnosis not present

## 2022-12-30 DIAGNOSIS — J301 Allergic rhinitis due to pollen: Secondary | ICD-10-CM | POA: Diagnosis not present

## 2023-01-04 ENCOUNTER — Other Ambulatory Visit (HOSPITAL_COMMUNITY): Payer: Self-pay

## 2023-01-04 NOTE — Telephone Encounter (Signed)
Pharmacy Patient Advocate Encounter  Received notification from [facility] that Prior Authorization for Xifaxan 550mg  has been APPROVED from 01/25/22 to 01/25/24. Ran test claim, Copay is $100.00. This test claim was processed through [facility]- copay amounts may vary at other pharmacies due to pharmacy/plan contracts, or as the patient moves through the different stages of their insurance plan.   (Note: I called and verified the approval date is 01/25/22 for this medication.)

## 2023-01-04 NOTE — Telephone Encounter (Signed)
Pharmacy Patient Advocate Encounter  Received notification from [facility] that Prior Authorization for Xifaxan 550mg   has been APPROVED from 01/25/22 to 01/25/24. Spoke to pharmacy to process.Copay is $100.00.

## 2023-01-06 ENCOUNTER — Other Ambulatory Visit (HOSPITAL_COMMUNITY): Payer: Self-pay

## 2023-01-06 DIAGNOSIS — J3089 Other allergic rhinitis: Secondary | ICD-10-CM | POA: Diagnosis not present

## 2023-01-06 DIAGNOSIS — J3081 Allergic rhinitis due to animal (cat) (dog) hair and dander: Secondary | ICD-10-CM | POA: Diagnosis not present

## 2023-01-06 DIAGNOSIS — J301 Allergic rhinitis due to pollen: Secondary | ICD-10-CM | POA: Diagnosis not present

## 2023-01-07 DIAGNOSIS — H5203 Hypermetropia, bilateral: Secondary | ICD-10-CM | POA: Diagnosis not present

## 2023-01-07 DIAGNOSIS — H2513 Age-related nuclear cataract, bilateral: Secondary | ICD-10-CM | POA: Diagnosis not present

## 2023-01-10 ENCOUNTER — Other Ambulatory Visit (HOSPITAL_COMMUNITY): Payer: Self-pay | Admitting: Neurology

## 2023-01-10 MED ORDER — STUDY - LIBREXIA-STROKE - MILVEXIAN 25 MG OR PLACEBO TABLET (PI-SETHI): 1.0000 | ORAL_TABLET | Freq: Two times a day (BID) | ORAL | 0 refills | Status: DC

## 2023-01-12 ENCOUNTER — Other Ambulatory Visit (INDEPENDENT_AMBULATORY_CARE_PROVIDER_SITE_OTHER): Payer: Medicare PPO

## 2023-01-12 ENCOUNTER — Ambulatory Visit: Payer: Medicare PPO | Admitting: Gastroenterology

## 2023-01-12 DIAGNOSIS — R195 Other fecal abnormalities: Secondary | ICD-10-CM | POA: Diagnosis not present

## 2023-01-12 DIAGNOSIS — R197 Diarrhea, unspecified: Secondary | ICD-10-CM

## 2023-01-12 DIAGNOSIS — K529 Noninfective gastroenteritis and colitis, unspecified: Secondary | ICD-10-CM

## 2023-01-12 DIAGNOSIS — K838 Other specified diseases of biliary tract: Secondary | ICD-10-CM

## 2023-01-12 LAB — CBC WITH DIFFERENTIAL/PLATELET
Basophils Absolute: 0 10*3/uL (ref 0.0–0.1)
Basophils Relative: 0.6 % (ref 0.0–3.0)
Eosinophils Absolute: 0.2 10*3/uL (ref 0.0–0.7)
Eosinophils Relative: 4.7 % (ref 0.0–5.0)
HCT: 39.2 % (ref 39.0–52.0)
Hemoglobin: 13.1 g/dL (ref 13.0–17.0)
Lymphocytes Relative: 28.1 % (ref 12.0–46.0)
Lymphs Abs: 1.4 10*3/uL (ref 0.7–4.0)
MCHC: 33.4 g/dL (ref 30.0–36.0)
MCV: 85.6 fL (ref 78.0–100.0)
Monocytes Absolute: 0.6 10*3/uL (ref 0.1–1.0)
Monocytes Relative: 12.4 % — ABNORMAL HIGH (ref 3.0–12.0)
Neutro Abs: 2.7 10*3/uL (ref 1.4–7.7)
Neutrophils Relative %: 54.2 % (ref 43.0–77.0)
Platelets: 223 10*3/uL (ref 150.0–400.0)
RBC: 4.58 Mil/uL (ref 4.22–5.81)
RDW: 14.8 % (ref 11.5–15.5)
WBC: 4.9 10*3/uL (ref 4.0–10.5)

## 2023-01-12 LAB — VITAMIN B12: Vitamin B-12: 1373 pg/mL — ABNORMAL HIGH (ref 211–911)

## 2023-01-12 LAB — FOLATE: Folate: 7.8 ng/mL (ref >5.9)

## 2023-01-12 LAB — IBC + FERRITIN
Ferritin: 25.9 ng/mL (ref 22.0–322.0)
Iron: 89 ug/dL (ref 42–165)
Saturation Ratios: 25.8 % (ref 20.0–50.0)
TIBC: 344.4 ug/dL (ref 250.0–450.0)
Transferrin: 246 mg/dL (ref 212.0–360.0)

## 2023-01-12 NOTE — Patient Instructions (Signed)
POST CAPSULE INSTRUCTIONS:  Contact our office immediately at [phone] if you suffer from any abdominal pain, nausea, or vomiting during capsule endoscopy. Do not eat or drink for at least 2 hours. After 2 hours you may have any of the following to drink: Water   White grape juice 7-Up   Chicken Bouillon Sprite   Ginger Ale After 4 hours you may have a light snack to include any of the following: A cup of soup   sandwich Bowl of cereal  Rice Toast   Eggs 2-3 small cookies (i.e. vanilla wafers or graham crackers) After 8 hours you may return to your regular diet. During your procedure do not go near anyone else that is having capsule endoscopy. Do not be in close contact with an MRI machine or a radio or television tower. Do not wear a heavy coat or sweater because your recorder may over heat and stop recording.   Do not disconnect the equipment or remove the belt at any time.  Since the Data Recorder is actually a small computer, it should be treated with utmost care and protection.  Avoid sudden movement and banging of the Data Recorder.  Do not do any heavy lifting or strenuous physical activity during the test especially if it involves sweating and do not bend over or stoop during capsule endoscopy. During capsule endoscopy, you will need to verify every 15 minutes that the small light on top of the Data Recorder is blinking twice per second.  If for some reason it stops blinking at this site, record the time and contact our office at [phone].  

## 2023-01-12 NOTE — Progress Notes (Signed)
SN: [id number] Exp: [id number] LOT: [id number] Patient arrived for Capsule Endoscopy. Reported the prep went well. This nurse explained dietary restrictions for the next few hours. Patient verbalized understanding. Opened capsule, ensured capsule was flashing prior to the patient swallowing the capsule. Patient swallowed capsule without difficulty. Patient instructed to return to the office at 4:00 pm today for removal of the recording equipment, to call the office with any questions and if no capsule was visualized after 72 hours. No further questions by the conclusion of the visit.

## 2023-01-13 ENCOUNTER — Other Ambulatory Visit (HOSPITAL_COMMUNITY): Payer: Self-pay

## 2023-01-13 DIAGNOSIS — J3089 Other allergic rhinitis: Secondary | ICD-10-CM | POA: Diagnosis not present

## 2023-01-13 DIAGNOSIS — J301 Allergic rhinitis due to pollen: Secondary | ICD-10-CM | POA: Diagnosis not present

## 2023-01-13 DIAGNOSIS — J3081 Allergic rhinitis due to animal (cat) (dog) hair and dander: Secondary | ICD-10-CM | POA: Diagnosis not present

## 2023-01-20 DIAGNOSIS — J3089 Other allergic rhinitis: Secondary | ICD-10-CM | POA: Diagnosis not present

## 2023-01-20 DIAGNOSIS — J3081 Allergic rhinitis due to animal (cat) (dog) hair and dander: Secondary | ICD-10-CM | POA: Diagnosis not present

## 2023-01-20 DIAGNOSIS — J301 Allergic rhinitis due to pollen: Secondary | ICD-10-CM | POA: Diagnosis not present

## 2023-01-24 ENCOUNTER — Other Ambulatory Visit: Payer: Self-pay

## 2023-01-24 ENCOUNTER — Encounter (HOSPITAL_COMMUNITY): Payer: Self-pay

## 2023-01-24 ENCOUNTER — Other Ambulatory Visit: Payer: Self-pay | Admitting: Gastroenterology

## 2023-01-24 ENCOUNTER — Ambulatory Visit (HOSPITAL_COMMUNITY)
Admission: RE | Admit: 2023-01-24 | Discharge: 2023-01-24 | Disposition: A | Discharge status: Home / Self Care | Payer: Medicare PPO | Source: Ambulatory Visit | Attending: Gastroenterology | Admitting: Gastroenterology

## 2023-01-24 DIAGNOSIS — K8020 Calculus of gallbladder without cholecystitis without obstruction: Secondary | ICD-10-CM | POA: Diagnosis not present

## 2023-01-24 DIAGNOSIS — E876 Hypokalemia: Secondary | ICD-10-CM

## 2023-01-24 DIAGNOSIS — K5730 Diverticulosis of large intestine without perforation or abscess without bleeding: Secondary | ICD-10-CM | POA: Diagnosis not present

## 2023-01-24 DIAGNOSIS — R197 Diarrhea, unspecified: Secondary | ICD-10-CM

## 2023-01-24 DIAGNOSIS — K838 Other specified diseases of biliary tract: Secondary | ICD-10-CM | POA: Diagnosis not present

## 2023-01-24 DIAGNOSIS — I700 Atherosclerosis of aorta: Secondary | ICD-10-CM | POA: Diagnosis not present

## 2023-01-24 DIAGNOSIS — N281 Cyst of kidney, acquired: Secondary | ICD-10-CM | POA: Diagnosis not present

## 2023-01-24 MED ORDER — GADOBUTROL 1 MMOL/ML IV SOLN
10.0000 mL | Freq: Once | INTRAVENOUS | Status: AC | PRN
  Administered 2023-01-24: 10 mL via INTRAVENOUS

## 2023-01-24 NOTE — Telephone Encounter (Signed)
Beth, I think it was from the initial hypokalemia where we had the need for repeat labs. Since last set of electrolytes looks okay, not clear that we need to do it, but we can just to make sure everything is doing okay. Let him know that the VCE is still pending read by my partners. Let him also know that the MRI/MRCP is still pending, hopefully will have a read within the next week. Thanks. GM

## 2023-01-25 ENCOUNTER — Other Ambulatory Visit (INDEPENDENT_AMBULATORY_CARE_PROVIDER_SITE_OTHER): Payer: Medicare PPO

## 2023-01-25 DIAGNOSIS — R197 Diarrhea, unspecified: Secondary | ICD-10-CM | POA: Diagnosis not present

## 2023-01-25 DIAGNOSIS — E876 Hypokalemia: Secondary | ICD-10-CM | POA: Diagnosis not present

## 2023-01-25 LAB — BASIC METABOLIC PANEL
BUN: 24 mg/dL — ABNORMAL HIGH (ref 6–23)
CO2: 29 meq/L (ref 19–32)
Calcium: 9 mg/dL (ref 8.4–10.5)
Chloride: 102 meq/L (ref 96–112)
Creatinine, Ser: 1.14 mg/dL (ref 0.40–1.50)
GFR: 66.24 mL/min (ref >60.00)
Glucose, Bld: 92 mg/dL (ref 70–99)
Potassium: 3.8 meq/L (ref 3.5–5.1)
Sodium: 141 meq/L (ref 135–145)

## 2023-01-27 DIAGNOSIS — J3081 Allergic rhinitis due to animal (cat) (dog) hair and dander: Secondary | ICD-10-CM | POA: Diagnosis not present

## 2023-01-27 DIAGNOSIS — J3089 Other allergic rhinitis: Secondary | ICD-10-CM | POA: Diagnosis not present

## 2023-01-27 DIAGNOSIS — J301 Allergic rhinitis due to pollen: Secondary | ICD-10-CM | POA: Diagnosis not present

## 2023-01-30 LAB — CUP PACEART REMOTE DEVICE CHECK
Date Time Interrogation Session: 20250103230750
Implantable Pulse Generator Implant Date: 20231219

## 2023-01-31 ENCOUNTER — Ambulatory Visit (INDEPENDENT_AMBULATORY_CARE_PROVIDER_SITE_OTHER): Payer: Medicare PPO

## 2023-01-31 DIAGNOSIS — I639 Cerebral infarction, unspecified: Secondary | ICD-10-CM

## 2023-02-02 DIAGNOSIS — K529 Noninfective gastroenteritis and colitis, unspecified: Secondary | ICD-10-CM | POA: Diagnosis not present

## 2023-02-03 DIAGNOSIS — J3081 Allergic rhinitis due to animal (cat) (dog) hair and dander: Secondary | ICD-10-CM | POA: Diagnosis not present

## 2023-02-03 DIAGNOSIS — J301 Allergic rhinitis due to pollen: Secondary | ICD-10-CM | POA: Diagnosis not present

## 2023-02-03 DIAGNOSIS — J3089 Other allergic rhinitis: Secondary | ICD-10-CM | POA: Diagnosis not present

## 2023-02-04 ENCOUNTER — Encounter: Payer: Self-pay | Admitting: Gastroenterology

## 2023-02-07 ENCOUNTER — Telehealth: Payer: Self-pay | Admitting: Gastroenterology

## 2023-02-07 NOTE — Telephone Encounter (Signed)
 PT is calling to ask if he needs a FU appointment per his results. Please advise.

## 2023-02-07 NOTE — Telephone Encounter (Signed)
 See 1/13 My Chart message

## 2023-02-10 DIAGNOSIS — J301 Allergic rhinitis due to pollen: Secondary | ICD-10-CM | POA: Diagnosis not present

## 2023-02-10 DIAGNOSIS — J3089 Other allergic rhinitis: Secondary | ICD-10-CM | POA: Diagnosis not present

## 2023-02-10 DIAGNOSIS — J3081 Allergic rhinitis due to animal (cat) (dog) hair and dander: Secondary | ICD-10-CM | POA: Diagnosis not present

## 2023-02-17 DIAGNOSIS — J301 Allergic rhinitis due to pollen: Secondary | ICD-10-CM | POA: Diagnosis not present

## 2023-02-17 DIAGNOSIS — J3089 Other allergic rhinitis: Secondary | ICD-10-CM | POA: Diagnosis not present

## 2023-02-17 DIAGNOSIS — J3081 Allergic rhinitis due to animal (cat) (dog) hair and dander: Secondary | ICD-10-CM | POA: Diagnosis not present

## 2023-02-24 DIAGNOSIS — G4733 Obstructive sleep apnea (adult) (pediatric): Secondary | ICD-10-CM | POA: Diagnosis not present

## 2023-02-24 DIAGNOSIS — I10 Essential (primary) hypertension: Secondary | ICD-10-CM | POA: Diagnosis not present

## 2023-02-24 DIAGNOSIS — R051 Acute cough: Secondary | ICD-10-CM | POA: Diagnosis not present

## 2023-02-24 DIAGNOSIS — J302 Other seasonal allergic rhinitis: Secondary | ICD-10-CM | POA: Diagnosis not present

## 2023-02-24 DIAGNOSIS — J029 Acute pharyngitis, unspecified: Secondary | ICD-10-CM | POA: Diagnosis not present

## 2023-02-24 DIAGNOSIS — Z1152 Encounter for screening for COVID-19: Secondary | ICD-10-CM | POA: Diagnosis not present

## 2023-02-24 DIAGNOSIS — J019 Acute sinusitis, unspecified: Secondary | ICD-10-CM | POA: Diagnosis not present

## 2023-02-24 DIAGNOSIS — R0981 Nasal congestion: Secondary | ICD-10-CM | POA: Diagnosis not present

## 2023-03-03 DIAGNOSIS — J3081 Allergic rhinitis due to animal (cat) (dog) hair and dander: Secondary | ICD-10-CM | POA: Diagnosis not present

## 2023-03-03 DIAGNOSIS — J301 Allergic rhinitis due to pollen: Secondary | ICD-10-CM | POA: Diagnosis not present

## 2023-03-03 DIAGNOSIS — J3089 Other allergic rhinitis: Secondary | ICD-10-CM | POA: Diagnosis not present

## 2023-03-03 DIAGNOSIS — G4733 Obstructive sleep apnea (adult) (pediatric): Secondary | ICD-10-CM | POA: Diagnosis not present

## 2023-03-07 ENCOUNTER — Ambulatory Visit (INDEPENDENT_AMBULATORY_CARE_PROVIDER_SITE_OTHER): Payer: Medicare PPO

## 2023-03-07 DIAGNOSIS — I639 Cerebral infarction, unspecified: Secondary | ICD-10-CM

## 2023-03-08 LAB — CUP PACEART REMOTE DEVICE CHECK
Date Time Interrogation Session: 20250207231004
Implantable Pulse Generator Implant Date: 20231219

## 2023-03-09 NOTE — Progress Notes (Signed)
Carelink Summary Report / Loop Recorder

## 2023-03-10 DIAGNOSIS — J301 Allergic rhinitis due to pollen: Secondary | ICD-10-CM | POA: Diagnosis not present

## 2023-03-10 DIAGNOSIS — J3089 Other allergic rhinitis: Secondary | ICD-10-CM | POA: Diagnosis not present

## 2023-03-10 DIAGNOSIS — J3081 Allergic rhinitis due to animal (cat) (dog) hair and dander: Secondary | ICD-10-CM | POA: Diagnosis not present

## 2023-03-15 DIAGNOSIS — M25561 Pain in right knee: Secondary | ICD-10-CM | POA: Diagnosis not present

## 2023-03-17 DIAGNOSIS — J3089 Other allergic rhinitis: Secondary | ICD-10-CM | POA: Diagnosis not present

## 2023-03-17 DIAGNOSIS — J3081 Allergic rhinitis due to animal (cat) (dog) hair and dander: Secondary | ICD-10-CM | POA: Diagnosis not present

## 2023-03-17 DIAGNOSIS — J301 Allergic rhinitis due to pollen: Secondary | ICD-10-CM | POA: Diagnosis not present

## 2023-03-24 DIAGNOSIS — J3081 Allergic rhinitis due to animal (cat) (dog) hair and dander: Secondary | ICD-10-CM | POA: Diagnosis not present

## 2023-03-24 DIAGNOSIS — J3089 Other allergic rhinitis: Secondary | ICD-10-CM | POA: Diagnosis not present

## 2023-03-24 DIAGNOSIS — J301 Allergic rhinitis due to pollen: Secondary | ICD-10-CM | POA: Diagnosis not present

## 2023-03-31 DIAGNOSIS — J301 Allergic rhinitis due to pollen: Secondary | ICD-10-CM | POA: Diagnosis not present

## 2023-03-31 DIAGNOSIS — J3081 Allergic rhinitis due to animal (cat) (dog) hair and dander: Secondary | ICD-10-CM | POA: Diagnosis not present

## 2023-03-31 DIAGNOSIS — J3089 Other allergic rhinitis: Secondary | ICD-10-CM | POA: Diagnosis not present

## 2023-04-07 DIAGNOSIS — J301 Allergic rhinitis due to pollen: Secondary | ICD-10-CM | POA: Diagnosis not present

## 2023-04-07 DIAGNOSIS — J3081 Allergic rhinitis due to animal (cat) (dog) hair and dander: Secondary | ICD-10-CM | POA: Diagnosis not present

## 2023-04-07 DIAGNOSIS — J3089 Other allergic rhinitis: Secondary | ICD-10-CM | POA: Diagnosis not present

## 2023-04-11 ENCOUNTER — Ambulatory Visit: Payer: Medicare PPO

## 2023-04-11 DIAGNOSIS — I639 Cerebral infarction, unspecified: Secondary | ICD-10-CM | POA: Diagnosis not present

## 2023-04-11 NOTE — Addendum Note (Signed)
 Addended by: Geralyn Flash D on: 04/11/2023 12:52 PM   Modules accepted: Orders

## 2023-04-11 NOTE — Progress Notes (Signed)
 Carelink Summary Report / Loop Recorder

## 2023-04-12 LAB — CUP PACEART REMOTE DEVICE CHECK
Date Time Interrogation Session: 20250316231640
Implantable Pulse Generator Implant Date: 20231219

## 2023-04-13 ENCOUNTER — Encounter: Payer: Self-pay | Admitting: Gastroenterology

## 2023-04-13 DIAGNOSIS — R197 Diarrhea, unspecified: Secondary | ICD-10-CM

## 2023-04-14 MED ORDER — RIFAXIMIN 550 MG PO TABS: 550.0000 mg | ORAL_TABLET | Freq: Three times a day (TID) | ORAL | 0 refills | Status: AC

## 2023-04-14 NOTE — Addendum Note (Signed)
 Addended by: Loretha Stapler on: 04/14/2023 11:10 AM   Modules accepted: Orders

## 2023-04-21 DIAGNOSIS — J3081 Allergic rhinitis due to animal (cat) (dog) hair and dander: Secondary | ICD-10-CM | POA: Diagnosis not present

## 2023-04-21 DIAGNOSIS — M25561 Pain in right knee: Secondary | ICD-10-CM | POA: Diagnosis not present

## 2023-04-21 DIAGNOSIS — J301 Allergic rhinitis due to pollen: Secondary | ICD-10-CM | POA: Diagnosis not present

## 2023-04-21 DIAGNOSIS — J3089 Other allergic rhinitis: Secondary | ICD-10-CM | POA: Diagnosis not present

## 2023-04-27 ENCOUNTER — Other Ambulatory Visit (INDEPENDENT_AMBULATORY_CARE_PROVIDER_SITE_OTHER)

## 2023-04-27 DIAGNOSIS — R197 Diarrhea, unspecified: Secondary | ICD-10-CM

## 2023-04-27 LAB — CBC WITH DIFFERENTIAL/PLATELET
Basophils Absolute: 0 10*3/uL (ref 0.0–0.1)
Basophils Relative: 0.6 % (ref 0.0–3.0)
Eosinophils Absolute: 0 10*3/uL (ref 0.0–0.7)
Eosinophils Relative: 0.4 % (ref 0.0–5.0)
HCT: 41 % (ref 39.0–52.0)
Hemoglobin: 14 g/dL (ref 13.0–17.0)
Lymphocytes Relative: 15.6 % (ref 12.0–46.0)
Lymphs Abs: 1.1 10*3/uL (ref 0.7–4.0)
MCHC: 34 g/dL (ref 30.0–36.0)
MCV: 86.8 fl (ref 78.0–100.0)
Monocytes Absolute: 0.6 10*3/uL (ref 0.1–1.0)
Monocytes Relative: 8.4 % (ref 3.0–12.0)
Neutro Abs: 5.4 10*3/uL (ref 1.4–7.7)
Neutrophils Relative %: 75 % (ref 43.0–77.0)
Platelets: 219 10*3/uL (ref 150.0–400.0)
RBC: 4.73 Mil/uL (ref 4.22–5.81)
RDW: 14.2 % (ref 11.5–15.5)
WBC: 7.2 10*3/uL (ref 4.0–10.5)

## 2023-04-27 LAB — COMPREHENSIVE METABOLIC PANEL WITH GFR
ALT: 27 U/L (ref 0–53)
AST: 22 U/L (ref 0–37)
Albumin: 4 g/dL (ref 3.5–5.2)
Alkaline Phosphatase: 37 U/L — ABNORMAL LOW (ref 39–117)
BUN: 23 mg/dL (ref 6–23)
CO2: 24 meq/L (ref 19–32)
Calcium: 8.5 mg/dL (ref 8.4–10.5)
Chloride: 105 meq/L (ref 96–112)
Creatinine, Ser: 1.24 mg/dL (ref 0.40–1.50)
GFR: 59.78 mL/min — ABNORMAL LOW (ref >60.00)
Glucose, Bld: 107 mg/dL — ABNORMAL HIGH (ref 70–99)
Potassium: 3.5 meq/L (ref 3.5–5.1)
Sodium: 140 meq/L (ref 135–145)
Total Bilirubin: 0.5 mg/dL (ref 0.2–1.2)
Total Protein: 6.6 g/dL (ref 6.0–8.3)

## 2023-04-27 LAB — SEDIMENTATION RATE: Sed Rate: 14 mm/h (ref 0–20)

## 2023-04-27 LAB — HIGH SENSITIVITY CRP: CRP, High Sensitivity: 1.92 mg/L (ref 0.000–5.000)

## 2023-04-27 NOTE — Telephone Encounter (Signed)
 Patty, Please get update from patient on Wednesday or Thursday. If the bowel are continuing to be an issue, let's now have him come in for stool studies to rule out infection. C. Difficile PCR/Stool Culture/Ova-Parasite/Fecal Calprotectin. Would ask if he is taking any anti-motility agents (Imodium or Lomotil). Find out what the formation of his stool is (loose/watery/semi-formed/formed) His gallbladder has no biliary stones or issues based on last MRICP.  I think his bowel issues are not related to gallbladder at this time. Thanks. GM

## 2023-04-27 NOTE — Addendum Note (Signed)
 Addended by: Loretha Stapler on: 04/27/2023 11:12 AM   Modules accepted: Orders

## 2023-04-27 NOTE — Addendum Note (Signed)
 Addended by: Loretha Stapler on: 04/27/2023 12:56 PM   Modules accepted: Orders

## 2023-04-27 NOTE — Telephone Encounter (Signed)
 The pt will pick up the stool kit this week. FYI

## 2023-04-27 NOTE — Telephone Encounter (Signed)
 Have patient also do Celiac testing. TTG IgA and IgA level as able. Can do CBC/CMP/ESR/CRP as well. Thanks. GM

## 2023-04-28 ENCOUNTER — Encounter: Payer: Self-pay | Admitting: Gastroenterology

## 2023-04-28 DIAGNOSIS — J301 Allergic rhinitis due to pollen: Secondary | ICD-10-CM | POA: Diagnosis not present

## 2023-04-28 DIAGNOSIS — J3089 Other allergic rhinitis: Secondary | ICD-10-CM | POA: Diagnosis not present

## 2023-04-28 DIAGNOSIS — J3081 Allergic rhinitis due to animal (cat) (dog) hair and dander: Secondary | ICD-10-CM | POA: Diagnosis not present

## 2023-04-28 LAB — IGA: Immunoglobulin A: 476 mg/dL — ABNORMAL HIGH (ref 70–320)

## 2023-04-28 LAB — TISSUE TRANSGLUTAMINASE, IGA: (tTG) Ab, IgA: 69.4 U/mL — ABNORMAL HIGH

## 2023-04-29 LAB — OVA AND PARASITE EXAMINATION
CONCENTRATE RESULT:: NONE SEEN
MICRO NUMBER:: 16279543
SPECIMEN QUALITY:: ADEQUATE
TRICHROME RESULT:: NONE SEEN

## 2023-04-29 LAB — CLOSTRIDIUM DIFFICILE BY PCR: Toxigenic C. Difficile by PCR: NEGATIVE

## 2023-04-29 LAB — CALPROTECTIN, FECAL: Calprotectin, Fecal: 43 ug/g (ref 0–120)

## 2023-05-02 ENCOUNTER — Other Ambulatory Visit: Payer: Self-pay

## 2023-05-02 DIAGNOSIS — R195 Other fecal abnormalities: Secondary | ICD-10-CM

## 2023-05-02 DIAGNOSIS — R768 Other specified abnormal immunological findings in serum: Secondary | ICD-10-CM

## 2023-05-02 LAB — STOOL CULTURE: E coli, Shiga toxin Assay: NEGATIVE

## 2023-05-10 ENCOUNTER — Ambulatory Visit (AMBULATORY_SURGERY_CENTER): Admitting: Gastroenterology

## 2023-05-10 ENCOUNTER — Encounter: Payer: Self-pay | Admitting: Gastroenterology

## 2023-05-10 VITALS — BP 114/70 | HR 66 | Temp 97.7°F | Resp 18 | Ht 70.0 in | Wt 211.0 lb

## 2023-05-10 DIAGNOSIS — K2950 Unspecified chronic gastritis without bleeding: Secondary | ICD-10-CM | POA: Diagnosis not present

## 2023-05-10 DIAGNOSIS — K3189 Other diseases of stomach and duodenum: Secondary | ICD-10-CM

## 2023-05-10 DIAGNOSIS — R768 Other specified abnormal immunological findings in serum: Secondary | ICD-10-CM

## 2023-05-10 DIAGNOSIS — R7689 Other specified abnormal immunological findings in serum: Secondary | ICD-10-CM

## 2023-05-10 DIAGNOSIS — K229 Disease of esophagus, unspecified: Secondary | ICD-10-CM

## 2023-05-10 DIAGNOSIS — K449 Diaphragmatic hernia without obstruction or gangrene: Secondary | ICD-10-CM | POA: Diagnosis not present

## 2023-05-10 DIAGNOSIS — K2970 Gastritis, unspecified, without bleeding: Secondary | ICD-10-CM | POA: Diagnosis not present

## 2023-05-10 DIAGNOSIS — I10 Essential (primary) hypertension: Secondary | ICD-10-CM | POA: Diagnosis not present

## 2023-05-10 DIAGNOSIS — G4730 Sleep apnea, unspecified: Secondary | ICD-10-CM | POA: Diagnosis not present

## 2023-05-10 DIAGNOSIS — J45909 Unspecified asthma, uncomplicated: Secondary | ICD-10-CM | POA: Diagnosis not present

## 2023-05-10 MED ORDER — SODIUM CHLORIDE 0.9 % IV SOLN: 500.0000 mL | Freq: Once | INTRAVENOUS | Status: DC

## 2023-05-10 MED ORDER — ESOMEPRAZOLE MAGNESIUM 40 MG PO CPDR
40.0000 mg | DELAYED_RELEASE_CAPSULE | Freq: Every day | ORAL | 1 refills | Status: AC
Start: 2023-05-10 — End: ?

## 2023-05-10 NOTE — Patient Instructions (Addendum)
   Handouts on gastritis,hiatal hernia/GERD given to you today  Await results of stomach & duodenal biopsies done  Continue previous diet & medications  Start Nexium 40 mg daily- sent order to your pharmacy     YOU HAD AN ENDOSCOPIC PROCEDURE TODAY AT THE [facility]:   Refer to the procedure report that was given to you for any specific questions about what was found during the examination.  If the procedure report does not answer your questions, please call your gastroenterologist to clarify.  If you requested that your care partner not be given the details of your procedure findings, then the procedure report has been included in a sealed envelope for you to review at your convenience later.  YOU SHOULD EXPECT: Some feelings of bloating in the abdomen. Passage of more gas than usual.  Walking can help get rid of the air that was put into your GI tract during the procedure and reduce the bloating. If you had a lower endoscopy (such as a colonoscopy or flexible sigmoidoscopy) you may notice spotting of blood in your stool or on the toilet paper. If you underwent a bowel prep for your procedure, you may not have a normal bowel movement for a few days.  Please Note:  You might notice some irritation and congestion in your nose or some drainage.  This is from the oxygen used during your procedure.  There is no need for concern and it should clear up in a day or so.  SYMPTOMS TO REPORT IMMEDIATELY:  Following upper endoscopy (EGD)  Vomiting of blood or coffee ground material  New chest pain or pain under the shoulder blades  Painful or persistently difficult swallowing  New shortness of breath  Fever of 100F or higher  Black, tarry-looking stools  For urgent or emergent issues, a gastroenterologist can be reached at any hour by calling [phone]. Do not use MyChart messaging for urgent concerns.    DIET:  We do recommend a small meal at first, but then you may proceed  to your regular diet.  Drink plenty of fluids but you should avoid alcoholic beverages for 24 hours.  ACTIVITY:  You should plan to take it easy for the rest of today and you should NOT DRIVE or use heavy machinery until tomorrow (because of the sedation medicines used during the test).    FOLLOW UP: Our staff will call the number listed on your records the next business day following your procedure.  We will call around 7:15- 8:00 am to check on you and address any questions or concerns that you may have regarding the information given to you following your procedure. If we do not reach you, we will leave a message.     If any biopsies were taken you will be contacted by phone or by letter within the next 1-3 weeks.  Please call us at [phone] if you have not heard about the biopsies in 3 weeks.    SIGNATURES/CONFIDENTIALITY: You and/or your care partner have signed paperwork which will be entered into your electronic medical record.  These signatures attest to the fact that that the information above on your After Visit Summary has been reviewed and is understood.  Full responsibility of the confidentiality of this discharge information lies with you and/or your care-partner.

## 2023-05-10 NOTE — Progress Notes (Signed)
 Vitals-Autumn  Pt's states no medical or surgical changes since previsit or office visit.

## 2023-05-10 NOTE — Progress Notes (Signed)
 Sedate, gd SR, tolerated procedure well, VSS, report to RN

## 2023-05-10 NOTE — Progress Notes (Signed)
 GASTROENTEROLOGY PROCEDURE H&P NOTE   Primary Care Physician: Gaspar Garbe, MD  HPI: Tyler [name] is a 68 y.o. male who presents for EGD for evaluation of chronic/recurrent diarrhea and elevated TTG concern for Celiac disease.  Past Medical History:  Diagnosis Date   Arthritis    Asthma    Complication of anesthesia 08/17/2016   pt had urinary retention following last sinus surgery that required him to go to the ED to be in and out [id number]   History of hiatal hernia    Hypertension    Sleep apnea    Stroke [facility])    Past Surgical History:  Procedure Laterality Date   LOOP RECORDER INSERTION N/A 01/12/2022   Procedure: LOOP RECORDER INSERTION;  Surgeon: Regan Lemming, MD;  Location: [facility] INVASIVE CV LAB;  Service: Cardiovascular;  Laterality: N/A;   NASAL SINUS SURGERY  01/26/2000   ROTATOR CUFF REPAIR     SEPTOPLASTY WITH ETHMOIDECTOMY, AND MAXILLARY ANTROSTOMY N/A 08/19/2016   Procedure: SEPTOPLASTY WITH ETHMOIDECTOMY, AND MAXILLARY ANTROSTOMY;  Surgeon: Flo Shanks, MD;  Location: [facility] OR;  Service: ENT;  Laterality: N/A;   TURBINATE REDUCTION N/A 08/19/2016   Procedure: BILATERAL TURBINATE REDUCTION;  Surgeon: Flo Shanks, MD;  Location: [facility] OR;  Service: ENT;  Laterality: N/A;   WISDOM TOOTH EXTRACTION     Current Outpatient Medications  Medication Sig Dispense Refill   albuterol (VENTOLIN HFA) 108 (90 Base) MCG/ACT inhaler Inhale 1 puff into the lungs as needed.     ALBUTEROL IN Inhale 1-2 puffs into the lungs every 4 (four) hours as needed (shortness of breath).     amLODipine (NORVASC) 10 MG tablet Take 10 mg by mouth daily.     Calcium-Magnesium-Zinc (CAL-MAG-ZINC PO) Take 2 tablets by mouth daily.     Cholecalciferol (VITAMIN D-3 PO) Take 1 tablet by mouth daily.     Cyanocobalamin (B-12 PO) Take 1 tablet by mouth daily.     ferrous sulfate 325 (65 FE) MG tablet Take 325 mg by mouth daily.      fluticasone furoate-vilanterol (BREO ELLIPTA)  200-25 MCG/ACT AEPB Inhale 1 puff into the lungs daily.     hydrochlorothiazide (HYDRODIURIL) 12.5 MG tablet Take 12.5 mg by mouth daily.     Investigational - Study Medication Take 1 tablet by mouth in the morning and at bedtime. Study name: Librexia-Stroke Study Additional study details: Milvexian 25 mg or placebo     levocetirizine (XYZAL) 5 MG tablet Take 5 mg by mouth every evening.     montelukast (SINGULAIR) 10 MG tablet Take 10 mg by mouth at bedtime.     olmesartan (BENICAR) 40 MG tablet Take 40 mg by mouth daily.     potassium chloride SA (KLOR-CON M) 20 MEQ tablet Take 1 tablet (20 mEq total) by mouth daily. 4 tablet 0   rosuvastatin (CRESTOR) 5 MG tablet Take 1 tablet (5 mg total) by mouth daily. 90 tablet 0   Study - LIBREXIA-STROKE - milvexian 25 mg or placebo tablet (PI-Sethi) Take 1 tablet by mouth 2 (two) times daily. For Investigational Use Only. Take 1 tablet by mouth twice a day with or without food. Please bring bottle to every visit. Please contact [facility] for any questions or concerns. 420 tablet 0   Tamsulosin HCl (FLOMAX) 0.4 MG CAPS Take 0.4 mg by mouth daily.     aspirin EC 81 MG tablet Take 1 tablet (81 mg total) by mouth daily. Swallow whole. 30 tablet 12  diphenoxylate-atropine (LOMOTIL) 2.5-0.025 MG tablet Take 1-2 tablets by mouth 4 (four) times daily. 60 tablet 2   EPINEPHrine 0.3 mg/0.3 mL IJ SOAJ injection INJECT AS DIRECTED AS NEEDED FOR SYSTEMIC REACTIONS FOR 30 DAYS     ondansetron (ZOFRAN-ODT) 4 MG disintegrating tablet Take 1 tablet (4 mg total) by mouth every 8 (eight) hours as needed. 20 tablet 0   potassium chloride SA (KLOR-CON M) 20 MEQ tablet Take 1 tablet (20 mEq total) by mouth 2 (two) times daily for 14 days. 28 tablet 0   sildenafil (VIAGRA) 50 MG tablet Take 50 mg by mouth as needed.     Current Facility-Administered Medications  Medication Dose Route Frequency Provider Last Rate Last Admin   0.9 %  sodium chloride infusion   500 mL Intravenous Once Mansouraty, Kellis Mcadam Jr., MD        Current Outpatient Medications:    albuterol (VENTOLIN HFA) 108 (90 Base) MCG/ACT inhaler, Inhale 1 puff into the lungs as needed., Disp: , Rfl:    ALBUTEROL IN, Inhale 1-2 puffs into the lungs every 4 (four) hours as needed (shortness of breath)., Disp: , Rfl:    amLODipine (NORVASC) 10 MG tablet, Take 10 mg by mouth daily., Disp: , Rfl:    Calcium-Magnesium-Zinc (CAL-MAG-ZINC PO), Take 2 tablets by mouth daily., Disp: , Rfl:    Cholecalciferol (VITAMIN D-3 PO), Take 1 tablet by mouth daily., Disp: , Rfl:    Cyanocobalamin (B-12 PO), Take 1 tablet by mouth daily., Disp: , Rfl:    ferrous sulfate 325 (65 FE) MG tablet, Take 325 mg by mouth daily. , Disp: , Rfl:    fluticasone furoate-vilanterol (BREO ELLIPTA) 200-25 MCG/ACT AEPB, Inhale 1 puff into the lungs daily., Disp: , Rfl:    hydrochlorothiazide (HYDRODIURIL) 12.5 MG tablet, Take 12.5 mg by mouth daily., Disp: , Rfl:    Investigational - Study Medication, Take 1 tablet by mouth in the morning and at bedtime. Study name: Librexia-Stroke Study Additional study details: Milvexian 25 mg or placebo, Disp: , Rfl:    levocetirizine (XYZAL) 5 MG tablet, Take 5 mg by mouth every evening., Disp: , Rfl:    montelukast (SINGULAIR) 10 MG tablet, Take 10 mg by mouth at bedtime., Disp: , Rfl:    olmesartan (BENICAR) 40 MG tablet, Take 40 mg by mouth daily., Disp: , Rfl:    potassium chloride SA (KLOR-CON M) 20 MEQ tablet, Take 1 tablet (20 mEq total) by mouth daily., Disp: 4 tablet, Rfl: 0   rosuvastatin (CRESTOR) 5 MG tablet, Take 1 tablet (5 mg total) by mouth daily., Disp: 90 tablet, Rfl: 0   Study - LIBREXIA-STROKE - milvexian 25 mg or placebo tablet (PI-Sethi), Take 1 tablet by mouth 2 (two) times daily. For Investigational Use Only. Take 1 tablet by mouth twice a day with or without food. Please bring bottle to every visit. Please contact [facility] for any questions or  concerns., Disp: 420 tablet, Rfl: 0   Tamsulosin HCl (FLOMAX) 0.4 MG CAPS, Take 0.4 mg by mouth daily., Disp: , Rfl:    aspirin EC 81 MG tablet, Take 1 tablet (81 mg total) by mouth daily. Swallow whole., Disp: 30 tablet, Rfl: 12   diphenoxylate-atropine (LOMOTIL) 2.5-0.025 MG tablet, Take 1-2 tablets by mouth 4 (four) times daily., Disp: 60 tablet, Rfl: 2   EPINEPHrine 0.3 mg/0.3 mL IJ SOAJ injection, INJECT AS DIRECTED AS NEEDED FOR SYSTEMIC REACTIONS FOR 30 DAYS, Disp: , Rfl:    ondansetron (ZOFRAN-ODT) 4 MG  disintegrating tablet, Take 1 tablet (4 mg total) by mouth every 8 (eight) hours as needed., Disp: 20 tablet, Rfl: 0   potassium chloride SA (KLOR-CON M) 20 MEQ tablet, Take 1 tablet (20 mEq total) by mouth 2 (two) times daily for 14 days., Disp: 28 tablet, Rfl: 0   sildenafil (VIAGRA) 50 MG tablet, Take 50 mg by mouth as needed., Disp: , Rfl:   Current Facility-Administered Medications:    0.9 %  sodium chloride infusion, 500 mL, Intravenous, Once, Mansouraty, Albino Alu., MD Allergies  Allergen Reactions   Lisinopril Other (See Comments)    cough   Losartan Cough   Augmentin [Amoxicillin-Pot Clavulanate] Rash    Has patient had a PCN reaction causing immediate rash, facial/tongue/throat swelling, SOB or lightheadedness with hypotension: Yes Has patient had a PCN reaction causing severe rash involving mucus membranes or skin necrosis: No Has patient had a PCN reaction that required hospitalization: No Has patient had a PCN reaction occurring within the last 10 years: Yes If all of the above answers are "NO", then may proceed with Cephalosporin use.    Doxycycline Rash   Family History  Problem Relation Age of Onset   Heart failure Mother    Aneurysm Father    Colon cancer Neg Hx    Esophageal cancer Neg Hx    Rectal cancer Neg Hx    Stomach cancer Neg Hx    Sleep apnea Neg Hx    Social History   Socioeconomic History   Marital status: Married    Spouse name: Not on file    Number of children: 2   Years of education: Not on file   Highest education level: Not on file  Occupational History   Occupation: Teacher  Tobacco Use   Smoking status: Some Days    Types: Cigars   Smokeless tobacco: Never   Tobacco comments:    occasional cigar  Vaping Use   Vaping status: Never Used  Substance and Sexual Activity   Alcohol use: Yes    Alcohol/week: 4.0 standard drinks of alcohol    Types: 4 Glasses of wine per week   Drug use: No   Sexual activity: Not on file  Other Topics Concern   Not on file  Social History Narrative   Not on file   Social Drivers of Health   Financial Resource Strain: Not on file  Food Insecurity: Not on file  Transportation Needs: Not on file  Physical Activity: Not on file  Stress: Not on file  Social Connections: Not on file  Intimate Partner Violence: Not on file    Physical Exam: Today's Vitals   05/10/23 1044 05/10/23 1047  BP: 132/76   Pulse: 65   Temp: 97.7 F (36.5 C)   TempSrc: Temporal   SpO2: 95%   Weight: 211 lb (95.7 kg)   Height: 5\' 10"  (1.778 m)   PainSc: 0-No pain 0-No pain   Body mass index is 30.28 kg/m. GEN: NAD EYE: Sclerae anicteric ENT: MMM CV: Non-tachycardic GI: Soft, NT/ND NEURO:  Alert & Oriented x 3  Lab Results: No results for input(s): "WBC", "HGB", "HCT", "PLT" in the last 72 hours. BMET No results for input(s): "NA", "K", "CL", "CO2", "GLUCOSE", "BUN", "CREATININE", "CALCIUM" in the last 72 hours. LFT No results for input(s): "PROT", "ALBUMIN", "AST", "ALT", "ALKPHOS", "BILITOT", "BILIDIR", "IBILI" in the last 72 hours. PT/INR No results for input(s): "LABPROT", "INR" in the last 72 hours.   Impression / Plan: This is a  68 y.o.male  who presents for EGD for evaluation of chronic/recurrent diarrhea and elevated TTG concern for Celiac disease.  The risks and benefits of endoscopic evaluation/treatment were discussed with the patient and/or family; these include but are not  limited to the risk of perforation, infection, bleeding, missed lesions, lack of diagnosis, severe illness requiring hospitalization, as well as anesthesia and sedation related illnesses.  The patient's history has been reviewed, patient examined, no change in status, and deemed stable for procedure.  The patient and/or family is agreeable to proceed.    [name], MD [facility] Advanced Endoscopy Office # [phone]

## 2023-05-10 NOTE — Op Note (Addendum)
 [name] Procedure Date: 05/10/2023 11:40 AM MRN: [id number] Endoscopist: Corliss Parish , MD, [id number] Age: 68 Referring MD:  Date of Birth: July 18, 1955 Gender: Male Account #: [id number] Procedure:                Upper GI endoscopy Indications:              Positive celiac serologies, Diarrhea Medicines:                Monitored Anesthesia Care Procedure:                Pre-Anesthesia Assessment:                           - Prior to the procedure, a History and Physical                            was performed, and patient medications and                            allergies were reviewed. The patient's tolerance of                            previous anesthesia was also reviewed. The risks                            and benefits of the procedure and the sedation                            options and risks were discussed with the patient.                            All questions were answered, and informed consent                            was obtained. Prior Anticoagulants: The patient has                            taken no anticoagulant or antiplatelet agents                            except for aspirin. ASA Grade Assessment: II - A                            patient with mild systemic disease. After reviewing                            the risks and benefits, the patient was deemed in                            satisfactory condition to undergo the procedure.                           After obtaining informed consent, the endoscope was  passed under direct vision. Throughout the                            procedure, the patient's blood pressure, pulse, and                            oxygen saturations were monitored continuously. The                            GIF [id number] #[id number] was introduced through the                            mouth, and advanced to the second part of duodenum.                             The upper GI endoscopy was accomplished without                            difficulty. The patient tolerated the procedure. Scope In: Scope Out: Findings:                 No gross lesions were noted in the entire esophagus.                           The Z-line was irregular and was found 38 cm from                            the incisors.                           A 3 cm hiatal hernia was present.                           Patchy mild inflammation characterized by erosions                            and erythema was found in the entire examined                            stomach. Biopsies were taken with a cold forceps                            for histology and Helicobacter pylori testing.                           Flattening was found in the duodenal bulb,                            flattening was found in the first portion of the                            duodenum and flattening was found in the second  portion of the duodenum. Biopsies for histology                            were taken with a cold forceps for evaluation of                            celiac disease. Complications:            No immediate complications. Estimated Blood Loss:     Estimated blood loss was minimal. Impression:               - No gross lesions in the entire esophagus. Z-line                            irregular, 38 cm from the incisors.                           - 3 cm hiatal hernia.                           - Gastritis. Biopsied.                           - Duodenal mucosal changes seen, suspicious for                            celiac disease. Biopsied. Recommendation:           - The patient will be observed post-procedure,                            until all discharge criteria are met.                           - Discharge patient to home.                           - Patient has a contact number available for                            emergencies. The signs and  symptoms of potential                            delayed complications were discussed with the                            patient. Return to normal activities tomorrow.                            Written discharge instructions were provided to the                            patient.                           - Gluten free diet.                           -  Start Nexium 40 mga daily.                           - Continue present medications.                           - Await pathology results.                           - Repeat upper endoscopy will likely be recommended                            after GFD initiation and when TTG has normalized or                            near normal to see if changes in small bowel are                            present or not.                           - Query Olmesartan enteropathy as well for another                            possible etiology of symptoms.                           - The findings and recommendations were discussed                            with the patient.                           - The findings and recommendations were discussed                            with the patient's family. Tyler Henle, MD 05/10/2023 12:04:44 PM

## 2023-05-11 ENCOUNTER — Telehealth: Payer: Self-pay

## 2023-05-11 NOTE — Telephone Encounter (Signed)
 Left message on answering machine.

## 2023-05-12 DIAGNOSIS — J301 Allergic rhinitis due to pollen: Secondary | ICD-10-CM | POA: Diagnosis not present

## 2023-05-12 DIAGNOSIS — J3089 Other allergic rhinitis: Secondary | ICD-10-CM | POA: Diagnosis not present

## 2023-05-12 DIAGNOSIS — J3081 Allergic rhinitis due to animal (cat) (dog) hair and dander: Secondary | ICD-10-CM | POA: Diagnosis not present

## 2023-05-12 LAB — SURGICAL PATHOLOGY

## 2023-05-16 ENCOUNTER — Ambulatory Visit: Payer: Medicare PPO

## 2023-05-16 ENCOUNTER — Encounter: Payer: Self-pay | Admitting: Gastroenterology

## 2023-05-16 ENCOUNTER — Telehealth: Payer: Self-pay

## 2023-05-16 DIAGNOSIS — K900 Celiac disease: Secondary | ICD-10-CM

## 2023-05-16 DIAGNOSIS — I639 Cerebral infarction, unspecified: Secondary | ICD-10-CM | POA: Diagnosis not present

## 2023-05-16 NOTE — Telephone Encounter (Signed)
 The pt has an appt already set up for follow up. Referral made to Nutrition for celiac disease and gluten free diet management.

## 2023-05-16 NOTE — Telephone Encounter (Signed)
 Tyler [name], please set up follow-up in clinic with APP or myself in the next 2 months.  Please place a nutrition/dietary recall for gluten-free diet discussions in diagnosis of new celiac disease.

## 2023-05-17 LAB — CUP PACEART REMOTE DEVICE CHECK
Date Time Interrogation Session: 20250420231616
Implantable Pulse Generator Implant Date: 20231219

## 2023-05-18 DIAGNOSIS — R052 Subacute cough: Secondary | ICD-10-CM | POA: Diagnosis not present

## 2023-05-18 DIAGNOSIS — J3089 Other allergic rhinitis: Secondary | ICD-10-CM | POA: Diagnosis not present

## 2023-05-18 DIAGNOSIS — J301 Allergic rhinitis due to pollen: Secondary | ICD-10-CM | POA: Diagnosis not present

## 2023-05-18 DIAGNOSIS — J3081 Allergic rhinitis due to animal (cat) (dog) hair and dander: Secondary | ICD-10-CM | POA: Diagnosis not present

## 2023-05-25 NOTE — Progress Notes (Signed)
 Carelink Summary Report / Loop Recorder

## 2023-05-25 NOTE — Addendum Note (Signed)
 Addended by: Edra Govern D on: 05/25/2023 03:57 PM   Modules accepted: Orders

## 2023-05-26 DIAGNOSIS — J3089 Other allergic rhinitis: Secondary | ICD-10-CM | POA: Diagnosis not present

## 2023-05-26 DIAGNOSIS — J301 Allergic rhinitis due to pollen: Secondary | ICD-10-CM | POA: Diagnosis not present

## 2023-05-26 DIAGNOSIS — J3081 Allergic rhinitis due to animal (cat) (dog) hair and dander: Secondary | ICD-10-CM | POA: Diagnosis not present

## 2023-06-01 DIAGNOSIS — I10 Essential (primary) hypertension: Secondary | ICD-10-CM | POA: Diagnosis not present

## 2023-06-01 DIAGNOSIS — E7800 Pure hypercholesterolemia, unspecified: Secondary | ICD-10-CM | POA: Diagnosis not present

## 2023-06-01 DIAGNOSIS — R972 Elevated prostate specific antigen [PSA]: Secondary | ICD-10-CM | POA: Diagnosis not present

## 2023-06-01 DIAGNOSIS — N401 Enlarged prostate with lower urinary tract symptoms: Secondary | ICD-10-CM | POA: Diagnosis not present

## 2023-06-01 DIAGNOSIS — Z1212 Encounter for screening for malignant neoplasm of rectum: Secondary | ICD-10-CM | POA: Diagnosis not present

## 2023-06-01 DIAGNOSIS — E291 Testicular hypofunction: Secondary | ICD-10-CM | POA: Diagnosis not present

## 2023-06-02 DIAGNOSIS — J301 Allergic rhinitis due to pollen: Secondary | ICD-10-CM | POA: Diagnosis not present

## 2023-06-02 DIAGNOSIS — J3081 Allergic rhinitis due to animal (cat) (dog) hair and dander: Secondary | ICD-10-CM | POA: Diagnosis not present

## 2023-06-02 DIAGNOSIS — J3089 Other allergic rhinitis: Secondary | ICD-10-CM | POA: Diagnosis not present

## 2023-06-08 DIAGNOSIS — I10 Essential (primary) hypertension: Secondary | ICD-10-CM | POA: Diagnosis not present

## 2023-06-08 DIAGNOSIS — K900 Celiac disease: Secondary | ICD-10-CM | POA: Diagnosis not present

## 2023-06-08 DIAGNOSIS — M1711 Unilateral primary osteoarthritis, right knee: Secondary | ICD-10-CM | POA: Diagnosis not present

## 2023-06-08 DIAGNOSIS — Z1339 Encounter for screening examination for other mental health and behavioral disorders: Secondary | ICD-10-CM | POA: Diagnosis not present

## 2023-06-08 DIAGNOSIS — Z0000 Encounter for general adult medical examination without abnormal findings: Secondary | ICD-10-CM | POA: Diagnosis not present

## 2023-06-08 DIAGNOSIS — G4733 Obstructive sleep apnea (adult) (pediatric): Secondary | ICD-10-CM | POA: Diagnosis not present

## 2023-06-08 DIAGNOSIS — E669 Obesity, unspecified: Secondary | ICD-10-CM | POA: Diagnosis not present

## 2023-06-08 DIAGNOSIS — Z8673 Personal history of transient ischemic attack (TIA), and cerebral infarction without residual deficits: Secondary | ICD-10-CM | POA: Diagnosis not present

## 2023-06-08 DIAGNOSIS — Z1331 Encounter for screening for depression: Secondary | ICD-10-CM | POA: Diagnosis not present

## 2023-06-08 DIAGNOSIS — K760 Fatty (change of) liver, not elsewhere classified: Secondary | ICD-10-CM | POA: Diagnosis not present

## 2023-06-08 DIAGNOSIS — R82998 Other abnormal findings in urine: Secondary | ICD-10-CM | POA: Diagnosis not present

## 2023-06-08 DIAGNOSIS — R972 Elevated prostate specific antigen [PSA]: Secondary | ICD-10-CM | POA: Diagnosis not present

## 2023-06-08 DIAGNOSIS — E7800 Pure hypercholesterolemia, unspecified: Secondary | ICD-10-CM | POA: Diagnosis not present

## 2023-06-08 DIAGNOSIS — M720 Palmar fascial fibromatosis [Dupuytren]: Secondary | ICD-10-CM | POA: Diagnosis not present

## 2023-06-09 DIAGNOSIS — J3089 Other allergic rhinitis: Secondary | ICD-10-CM | POA: Diagnosis not present

## 2023-06-09 DIAGNOSIS — J301 Allergic rhinitis due to pollen: Secondary | ICD-10-CM | POA: Diagnosis not present

## 2023-06-09 DIAGNOSIS — J3081 Allergic rhinitis due to animal (cat) (dog) hair and dander: Secondary | ICD-10-CM | POA: Diagnosis not present

## 2023-06-16 DIAGNOSIS — J301 Allergic rhinitis due to pollen: Secondary | ICD-10-CM | POA: Diagnosis not present

## 2023-06-16 DIAGNOSIS — M1711 Unilateral primary osteoarthritis, right knee: Secondary | ICD-10-CM | POA: Diagnosis not present

## 2023-06-16 DIAGNOSIS — J3081 Allergic rhinitis due to animal (cat) (dog) hair and dander: Secondary | ICD-10-CM | POA: Diagnosis not present

## 2023-06-16 DIAGNOSIS — J3089 Other allergic rhinitis: Secondary | ICD-10-CM | POA: Diagnosis not present

## 2023-06-17 ENCOUNTER — Encounter: Payer: Self-pay | Admitting: Gastroenterology

## 2023-06-17 ENCOUNTER — Ambulatory Visit: Admitting: Gastroenterology

## 2023-06-17 VITALS — BP 106/78 | HR 75 | Ht 70.0 in | Wt 215.1 lb

## 2023-06-17 DIAGNOSIS — R140 Abdominal distension (gaseous): Secondary | ICD-10-CM | POA: Diagnosis not present

## 2023-06-17 DIAGNOSIS — K900 Celiac disease: Secondary | ICD-10-CM | POA: Diagnosis not present

## 2023-06-17 DIAGNOSIS — K529 Noninfective gastroenteritis and colitis, unspecified: Secondary | ICD-10-CM

## 2023-06-17 NOTE — Patient Instructions (Signed)
 Your provider has requested that you go to the basement level for lab work on or around 08/19/2023. Press "B" on the elevator. The lab is located at the first door on the left as you exit the elevator.   You will be due for a recall EGD in 08/2023. We will send you a reminder in the mail when it gets closer to that time.  Please send my chart message with name of dietician.   _______________________________________________________  If your blood pressure at your visit was 140/90 or greater, please contact your primary care physician to follow up on this.  _______________________________________________________  If you are age 69 or older, your body mass index should be between 23-30. Your Body mass index is 30.87 kg/m. If this is out of the aforementioned range listed, please consider follow up with your Primary Care Provider.  If you are age 46 or younger, your body mass index should be between 19-25. Your Body mass index is 30.87 kg/m. If this is out of the aformentioned range listed, please consider follow up with your Primary Care Provider.   ________________________________________________________  The [facility] providers would like to encourage you to use MYCHART to communicate with providers for non-urgent requests or questions.  Due to long hold times on the telephone, sending your provider a message by [facility] may be a faster and more efficient way to get a response.  Please allow 48 business hours for a response.  Please remember that this is for non-urgent requests.  _______________________________________________________  Thank you for choosing me and [facility].  Dr. Brice Campi

## 2023-06-17 NOTE — Progress Notes (Unsigned)
 GASTROENTEROLOGY OUTPATIENT CLINIC VISIT   Primary Care Provider Tisovec, Kristina Pfeiffer, MD [address] [place] [address] [phone]  Referring Provider Tisovec, Kristina Pfeiffer, MD [address] [place],  [place] [address] [phone]  Patient Profile: Tyler [name] is a 68 y.o. male with a pmh significant for  The patient presents to the [facility] for an evaluation and management of problem(s) noted below:  Problem List No diagnosis found.  History of Present Illness    The patient does/does not take NSAIDs or BC/Goody Powder. Patient has/has not had an EGD. Patient has/has not had a Colonoscopy.  GI Review of Systems Positive as above Negative for  Pyrosis; Reflux; Regurgitation; Dysphagia; Odynophagia; Globus; Post-prandial cough; Nocturnal cough; Nasal regurgitation; Epigastric pain; Nausea; Vomiting; Hematemesis; Jaundice; Change in Appetite; Early satiety; Abdominal pain; Abdominal bloating; Eructation; Flatulence; Change in BM Frequency; Change in BM Consistency; Constipation; Diarrhea; Incontinence; Urgency; Tenesmus; Hematochezia; Melena  Review of Systems General: Denies fevers/chills/weight loss unintentionally Cardiovascular: Denies chest pain Pulmonary: Denies shortness of breath Gastroenterological: See HPI Genitourinary: Denies darkened urine Hematological: Denies easy bruising/bleeding Endocrine: Denies temperature intolerance Dermatological: Denies jaundice Psychological: Mood is stable  Medications Current Outpatient Medications  Medication Sig Dispense Refill   albuterol  (VENTOLIN  HFA) 108 (90 Base) MCG/ACT inhaler Inhale 1 puff into the lungs as needed.     amLODipine  (NORVASC ) 10 MG tablet Take 10 mg by mouth daily.     aspirin  EC 81 MG tablet Take 1 tablet (81 mg total) by mouth daily. Swallow whole. 30 tablet 12   Calcium -Magnesium -Zinc  (CAL-MAG-ZINC  PO) Take 2 tablets by mouth daily.     Cholecalciferol  (VITAMIN D-3 PO) Take 1 tablet by mouth daily.     Collagen-Vitamin C-Biotin (COLLAGEN PO) Take by mouth. Type II Collagen-40mg  once daily     Cyanocobalamin  (B-12 PO) Take 1 tablet by mouth daily.     EPINEPHrine  0.3 mg/0.3 mL IJ SOAJ injection INJECT AS DIRECTED AS NEEDED FOR SYSTEMIC REACTIONS FOR 30 DAYS     esomeprazole  (NEXIUM ) 40 MG capsule Take 1 capsule (40 mg total) by mouth daily at 12 noon. 90 capsule 1   ferrous sulfate 325 (65 FE) MG tablet Take 325 mg by mouth daily.      fluticasone furoate-vilanterol (BREO ELLIPTA) 200-25 MCG/ACT AEPB Inhale 1 puff into the lungs daily.     hydrochlorothiazide  (HYDRODIURIL ) 12.5 MG tablet Take 12.5 mg by mouth daily.     Investigational - Study Medication Take 1 tablet by mouth in the morning and at bedtime. Study name: Librexia-Stroke Study Additional study details: Milvexian 25 mg or placebo     levocetirizine (XYZAL) 5 MG tablet Take 5 mg by mouth every evening.     montelukast (SINGULAIR) 10 MG tablet Take 10 mg by mouth at bedtime.     olmesartan (BENICAR) 40 MG tablet Take 40 mg by mouth daily.     Omega-3 Fatty Acids (FISH OIL PO) Take 1,400 mg by mouth daily.     ondansetron  (ZOFRAN -ODT) 4 MG disintegrating tablet Take 1 tablet (4 mg total) by mouth every 8 (eight) hours as needed. 20 tablet 0   rosuvastatin  (CRESTOR ) 5 MG tablet Take 1 tablet (5 mg total) by mouth daily. 90 tablet 0   sildenafil (VIAGRA) 50 MG tablet Take 50 mg by mouth as needed.     Study - LIBREXIA-STROKE - milvexian 25 mg or placebo tablet (PI-Sethi) Take 1 tablet by mouth 2 (two) times daily. For Investigational Use Only. Take 1  tablet by mouth twice a day with or without food. Please bring bottle to every visit. Please contact [facility] for any questions or concerns. 420 tablet 0   Tamsulosin  HCl (FLOMAX ) 0.4 MG CAPS Take 0.4 mg by mouth daily.     No current facility-administered medications for this visit.    Allergies Allergies  Allergen  Reactions   Lisinopril Other (See Comments)    cough   Losartan Cough   Augmentin [Amoxicillin-Pot Clavulanate] Rash    Has patient had a PCN reaction causing immediate rash, facial/tongue/throat swelling, SOB or lightheadedness with hypotension: Yes Has patient had a PCN reaction causing severe rash involving mucus membranes or skin necrosis: No Has patient had a PCN reaction that required hospitalization: No Has patient had a PCN reaction occurring within the last 10 years: Yes If all of the above answers are "NO", then may proceed with Cephalosporin use.    Doxycycline Rash    Histories Past Medical History:  Diagnosis Date   Arthritis    Asthma    Complication of anesthesia 08/17/2016   pt had urinary retention following last sinus surgery that required him to go to the ED to be in and out cathedx1   History of hiatal hernia    Hypertension    Sleep apnea    Stroke [facility])    Past Surgical History:  Procedure Laterality Date   LOOP RECORDER INSERTION N/A 01/12/2022   Procedure: LOOP RECORDER INSERTION;  Surgeon: Lei Pump, MD;  Location: [facility] INVASIVE CV LAB;  Service: Cardiovascular;  Laterality: N/A;   NASAL SINUS SURGERY  01/26/2000   ROTATOR CUFF REPAIR     SEPTOPLASTY WITH ETHMOIDECTOMY, AND MAXILLARY ANTROSTOMY N/A 08/19/2016   Procedure: SEPTOPLASTY WITH ETHMOIDECTOMY, AND MAXILLARY ANTROSTOMY;  Surgeon: Lenton Rail, MD;  Location: [facility] OR;  Service: ENT;  Laterality: N/A;   TURBINATE REDUCTION N/A 08/19/2016   Procedure: BILATERAL TURBINATE REDUCTION;  Surgeon: Lenton Rail, MD;  Location: [facility] OR;  Service: ENT;  Laterality: N/A;   WISDOM TOOTH EXTRACTION     Social History   Socioeconomic History   Marital status: Married    Spouse name: Not on file   Number of children: 2   Years of education: Not on file   Highest education level: Not on file  Occupational History   Occupation: Teacher  Tobacco Use   Smoking status: Some Days    Types: Cigars    Smokeless tobacco: Never   Tobacco comments:    occasional cigar  Vaping Use   Vaping status: Never Used  Substance and Sexual Activity   Alcohol  use: Yes    Alcohol /week: 4.0 standard drinks of alcohol     Types: 4 Glasses of wine per week   Drug use: No   Sexual activity: Not on file  Other Topics Concern   Not on file  Social History Narrative   Not on file   Social Drivers of Health   Financial Resource Strain: Not on file  Food Insecurity: Not on file  Transportation Needs: Not on file  Physical Activity: Not on file  Stress: Not on file  Social Connections: Not on file  Intimate Partner Violence: Not on file   Family History  Problem Relation Age of Onset   Heart failure Mother    Aneurysm Father    Colon cancer Neg Hx    Esophageal cancer Neg Hx    Rectal cancer Neg Hx    Stomach cancer Neg Hx  Sleep apnea Neg Hx    Pancreatic cancer Neg Hx    I have reviewed his medical, social, and family history in detail and updated the electronic medical record as necessary.    PHYSICAL EXAMINATION  BP 106/78   Pulse 75   Ht 5\' 10"  (1.778 m)   Wt 215 lb 2 oz (97.6 kg)   SpO2 95%   BMI 30.87 kg/m  Wt Readings from Last 3 Encounters:  06/17/23 215 lb 2 oz (97.6 kg)  05/10/23 211 lb (95.7 kg)  12/14/22 212 lb (96.2 kg)   GEN: NAD, appears stated age, doesn't appear chronically ill PSYCH: Cooperative, without pressured speech EYE: Conjunctivae pink, sclerae anicteric ENT: MMM CV: Nontachycardic RESP: No audible wheezing GI: NABS, soft, NT/ND, without rebound or guarding, no HSM appreciated GU: DRE shows MSK/EXT: No significant lower extremity edema SKIN: No jaundice, no spider angiomata NEURO:  Alert & Oriented x 3, no focal deficits, no evidence of asterixis   REVIEW OF DATA  I reviewed the following data at the time of this encounter:  GI Procedures and Studies  ***  Laboratory Studies  ***  Imaging Studies  ***   ASSESSMENT  Mr. Zietlow  is a 68 y.o. male with a pmh significant for The patient is seen today for evaluation and management of:  No diagnosis found.  ***   PLAN  There are no diagnoses linked to this encounter.   No orders of the defined types were placed in this encounter.   New Prescriptions   No medications on file   Modified Medications   No medications on file    Planned Follow Up No follow-ups on file.   Total Time in Face-to-Face and in Coordination of Care for patient including independent/personal interpretation/review of prior testing, medical history, examination, medication adjustment, communicating results with the patient directly, and documentation within the EHR is ***.   [name], MD [facility] Office # [phone]

## 2023-06-20 ENCOUNTER — Encounter: Payer: Self-pay | Admitting: Gastroenterology

## 2023-06-21 ENCOUNTER — Ambulatory Visit (INDEPENDENT_AMBULATORY_CARE_PROVIDER_SITE_OTHER): Payer: Medicare PPO

## 2023-06-21 ENCOUNTER — Encounter: Payer: Self-pay | Admitting: Gastroenterology

## 2023-06-21 DIAGNOSIS — K529 Noninfective gastroenteritis and colitis, unspecified: Secondary | ICD-10-CM | POA: Insufficient documentation

## 2023-06-21 DIAGNOSIS — I639 Cerebral infarction, unspecified: Secondary | ICD-10-CM

## 2023-06-21 DIAGNOSIS — K900 Celiac disease: Secondary | ICD-10-CM | POA: Insufficient documentation

## 2023-06-21 DIAGNOSIS — R140 Abdominal distension (gaseous): Secondary | ICD-10-CM | POA: Insufficient documentation

## 2023-06-21 LAB — CUP PACEART REMOTE DEVICE CHECK
Date Time Interrogation Session: 20250526232800
Implantable Pulse Generator Implant Date: 20231219

## 2023-06-22 ENCOUNTER — Ambulatory Visit: Payer: Self-pay | Admitting: Cardiology

## 2023-06-22 ENCOUNTER — Encounter: Payer: Self-pay | Admitting: Gastroenterology

## 2023-06-23 DIAGNOSIS — M1711 Unilateral primary osteoarthritis, right knee: Secondary | ICD-10-CM | POA: Diagnosis not present

## 2023-06-24 ENCOUNTER — Other Ambulatory Visit

## 2023-06-24 ENCOUNTER — Other Ambulatory Visit: Payer: Self-pay

## 2023-06-24 DIAGNOSIS — R197 Diarrhea, unspecified: Secondary | ICD-10-CM | POA: Diagnosis not present

## 2023-06-24 DIAGNOSIS — K900 Celiac disease: Secondary | ICD-10-CM

## 2023-06-24 LAB — COMPREHENSIVE METABOLIC PANEL WITH GFR
ALT: 26 U/L (ref 0–53)
AST: 19 U/L (ref 0–37)
Albumin: 3.8 g/dL (ref 3.5–5.2)
Alkaline Phosphatase: 45 U/L (ref 39–117)
BUN: 23 mg/dL (ref 6–23)
CO2: 24 meq/L (ref 19–32)
Calcium: 8.3 mg/dL — ABNORMAL LOW (ref 8.4–10.5)
Chloride: 102 meq/L (ref 96–112)
Creatinine, Ser: 1.25 mg/dL (ref 0.40–1.50)
GFR: 59.14 mL/min — ABNORMAL LOW (ref >60.00)
Glucose, Bld: 108 mg/dL — ABNORMAL HIGH (ref 70–99)
Potassium: 3.3 meq/L — ABNORMAL LOW (ref 3.5–5.1)
Sodium: 137 meq/L (ref 135–145)
Total Bilirubin: 0.6 mg/dL (ref 0.2–1.2)
Total Protein: 6.4 g/dL (ref 6.0–8.3)

## 2023-06-24 LAB — CBC WITH DIFFERENTIAL/PLATELET
Basophils Absolute: 0 10*3/uL (ref 0.0–0.1)
Basophils Relative: 0.4 % (ref 0.0–3.0)
Eosinophils Absolute: 0.1 10*3/uL (ref 0.0–0.7)
Eosinophils Relative: 2.4 % (ref 0.0–5.0)
HCT: 42.7 % (ref 39.0–52.0)
Hemoglobin: 14.5 g/dL (ref 13.0–17.0)
Lymphocytes Relative: 25.1 % (ref 12.0–46.0)
Lymphs Abs: 1.5 10*3/uL (ref 0.7–4.0)
MCHC: 34 g/dL (ref 30.0–36.0)
MCV: 84 fl (ref 78.0–100.0)
Monocytes Absolute: 0.7 10*3/uL (ref 0.1–1.0)
Monocytes Relative: 11.7 % (ref 3.0–12.0)
Neutro Abs: 3.5 10*3/uL (ref 1.4–7.7)
Neutrophils Relative %: 60.4 % (ref 43.0–77.0)
Platelets: 191 10*3/uL (ref 150.0–400.0)
RBC: 5.08 Mil/uL (ref 4.22–5.81)
RDW: 13.9 % (ref 11.5–15.5)
WBC: 5.8 10*3/uL (ref 4.0–10.5)

## 2023-06-24 LAB — SEDIMENTATION RATE: Sed Rate: 19 mm/h (ref 0–20)

## 2023-06-24 LAB — HIGH SENSITIVITY CRP: CRP, High Sensitivity: 8.68 mg/L — ABNORMAL HIGH (ref 0.000–5.000)

## 2023-06-24 LAB — VITAMIN D 25 HYDROXY (VIT D DEFICIENCY, FRACTURES): VITD: 18.78 ng/mL — ABNORMAL LOW (ref 30.00–100.00)

## 2023-06-24 MED ORDER — RIFAXIMIN 550 MG PO TABS: 550.0000 mg | ORAL_TABLET | Freq: Three times a day (TID) | ORAL | 0 refills | Status: AC

## 2023-06-24 NOTE — Telephone Encounter (Signed)
 Called and spoke with patient. Xifaxan  RX ordered. Lab work and stool studies ordered. Patient will come in this afternoon for blood work.

## 2023-06-25 LAB — IGA: Immunoglobulin A: 452 mg/dL — ABNORMAL HIGH (ref 70–320)

## 2023-06-25 LAB — TISSUE TRANSGLUTAMINASE, IGA
(tTG) Ab, IgA: 28.6 U/mL — ABNORMAL HIGH
(tTG) Ab, IgA: 29.4 U/mL — ABNORMAL HIGH

## 2023-06-28 DIAGNOSIS — H6123 Impacted cerumen, bilateral: Secondary | ICD-10-CM | POA: Diagnosis not present

## 2023-06-28 MED ORDER — DIPHENOXYLATE-ATROPINE 2.5-0.025 MG PO TABS: 2.0000 | ORAL_TABLET | Freq: Three times a day (TID) | ORAL | 0 refills | Status: DC

## 2023-06-28 MED ORDER — ONDANSETRON HCL 4 MG PO TABS: 4.0000 mg | ORAL_TABLET | Freq: Four times a day (QID) | ORAL | 0 refills | Status: DC | PRN

## 2023-06-28 NOTE — Telephone Encounter (Signed)
 Would let patient know that I did review his labs. Hopefully he is able to get the stool studies completed His continued elevation in TTG, still suggestive of active celiac could be playing some role and we thought that that would be the case which is why we were still waiting a few months on gluten-free diet to see if this would normalize. Please send in a prescription for Lomotil  (2 tablets 3 times daily as needed 30/0). He may add Imodium  up to 12 mg daily (4 mg first dose in the morning and may use 2 mg thereafter), if he still needs. Transition diet to bland diet. If still having nausea, then needs Zofran  4 mg every 6 hours as needed 30/0). GM

## 2023-06-28 NOTE — Addendum Note (Signed)
 Addended by: Aneita Keens on: 06/28/2023 08:56 AM   Modules accepted: Orders

## 2023-06-29 ENCOUNTER — Ambulatory Visit: Payer: Self-pay | Admitting: Gastroenterology

## 2023-06-29 DIAGNOSIS — E876 Hypokalemia: Secondary | ICD-10-CM

## 2023-06-29 MED ORDER — POTASSIUM CHLORIDE CRYS ER 20 MEQ PO TBCR: 20.0000 meq | EXTENDED_RELEASE_TABLET | Freq: Every day | ORAL | 0 refills | Status: DC

## 2023-06-30 DIAGNOSIS — M1711 Unilateral primary osteoarthritis, right knee: Secondary | ICD-10-CM | POA: Diagnosis not present

## 2023-06-30 DIAGNOSIS — J301 Allergic rhinitis due to pollen: Secondary | ICD-10-CM | POA: Diagnosis not present

## 2023-06-30 DIAGNOSIS — J3081 Allergic rhinitis due to animal (cat) (dog) hair and dander: Secondary | ICD-10-CM | POA: Diagnosis not present

## 2023-06-30 DIAGNOSIS — J3089 Other allergic rhinitis: Secondary | ICD-10-CM | POA: Diagnosis not present

## 2023-07-01 NOTE — Progress Notes (Signed)
 Carelink Summary Report / Loop Recorder

## 2023-07-01 NOTE — Addendum Note (Signed)
 Addended by: Edra Govern D on: 07/01/2023 02:57 PM   Modules accepted: Orders

## 2023-07-04 ENCOUNTER — Other Ambulatory Visit (HOSPITAL_COMMUNITY): Payer: Self-pay | Admitting: Pharmacist

## 2023-07-04 ENCOUNTER — Other Ambulatory Visit

## 2023-07-04 DIAGNOSIS — R197 Diarrhea, unspecified: Secondary | ICD-10-CM

## 2023-07-04 MED ORDER — STUDY - LIBREXIA-STROKE - MILVEXIAN 25 MG OR PLACEBO TABLET (PI-SETHI): 1.0000 | ORAL_TABLET | Freq: Two times a day (BID) | ORAL | 0 refills | Status: DC

## 2023-07-05 LAB — CLOSTRIDIUM DIFFICILE BY PCR: Toxigenic C. Difficile by PCR: NEGATIVE

## 2023-07-05 NOTE — Telephone Encounter (Signed)
 Patty, Can we see if they can run Fecal Calprotectin to stool given? Thanks. GM

## 2023-07-06 ENCOUNTER — Other Ambulatory Visit (INDEPENDENT_AMBULATORY_CARE_PROVIDER_SITE_OTHER)

## 2023-07-06 ENCOUNTER — Other Ambulatory Visit: Payer: Self-pay

## 2023-07-06 DIAGNOSIS — E876 Hypokalemia: Secondary | ICD-10-CM

## 2023-07-06 DIAGNOSIS — R140 Abdominal distension (gaseous): Secondary | ICD-10-CM

## 2023-07-06 DIAGNOSIS — R197 Diarrhea, unspecified: Secondary | ICD-10-CM

## 2023-07-06 DIAGNOSIS — K900 Celiac disease: Secondary | ICD-10-CM

## 2023-07-06 LAB — BASIC METABOLIC PANEL WITH GFR
BUN: 30 mg/dL — ABNORMAL HIGH (ref 6–23)
CO2: 29 meq/L (ref 19–32)
Calcium: 8.6 mg/dL (ref 8.4–10.5)
Chloride: 104 meq/L (ref 96–112)
Creatinine, Ser: 1.65 mg/dL — ABNORMAL HIGH (ref 0.40–1.50)
GFR: 42.37 mL/min — ABNORMAL LOW (ref >60.00)
Glucose, Bld: 106 mg/dL — ABNORMAL HIGH (ref 70–99)
Potassium: 3.2 meq/L — ABNORMAL LOW (ref 3.5–5.1)
Sodium: 141 meq/L (ref 135–145)

## 2023-07-06 NOTE — Telephone Encounter (Signed)
 Called [facility] and fecal calproctectin was added to the stool testing submitted on 6/9.  They will fax a note if this is not able to be run.

## 2023-07-07 ENCOUNTER — Other Ambulatory Visit: Payer: Self-pay

## 2023-07-07 ENCOUNTER — Ambulatory Visit: Payer: Self-pay | Admitting: Gastroenterology

## 2023-07-07 DIAGNOSIS — G4733 Obstructive sleep apnea (adult) (pediatric): Secondary | ICD-10-CM | POA: Diagnosis not present

## 2023-07-07 DIAGNOSIS — J3089 Other allergic rhinitis: Secondary | ICD-10-CM | POA: Diagnosis not present

## 2023-07-07 DIAGNOSIS — E876 Hypokalemia: Secondary | ICD-10-CM

## 2023-07-07 DIAGNOSIS — R140 Abdominal distension (gaseous): Secondary | ICD-10-CM | POA: Diagnosis not present

## 2023-07-07 DIAGNOSIS — J301 Allergic rhinitis due to pollen: Secondary | ICD-10-CM | POA: Diagnosis not present

## 2023-07-07 DIAGNOSIS — K900 Celiac disease: Secondary | ICD-10-CM | POA: Diagnosis not present

## 2023-07-07 DIAGNOSIS — J3081 Allergic rhinitis due to animal (cat) (dog) hair and dander: Secondary | ICD-10-CM | POA: Diagnosis not present

## 2023-07-07 DIAGNOSIS — R197 Diarrhea, unspecified: Secondary | ICD-10-CM | POA: Diagnosis not present

## 2023-07-07 MED ORDER — POTASSIUM CHLORIDE CRYS ER 20 MEQ PO TBCR: 20.0000 meq | EXTENDED_RELEASE_TABLET | Freq: Two times a day (BID) | ORAL | 0 refills | Status: DC

## 2023-07-09 LAB — STOOL CULTURE: E coli, Shiga toxin Assay: NEGATIVE

## 2023-07-09 LAB — CALPROTECTIN, FECAL: Calprotectin, Fecal: 176 ug/g — ABNORMAL HIGH (ref 0–120)

## 2023-07-11 ENCOUNTER — Other Ambulatory Visit: Payer: Self-pay

## 2023-07-11 DIAGNOSIS — K900 Celiac disease: Secondary | ICD-10-CM

## 2023-07-11 DIAGNOSIS — R197 Diarrhea, unspecified: Secondary | ICD-10-CM

## 2023-07-11 DIAGNOSIS — K529 Noninfective gastroenteritis and colitis, unspecified: Secondary | ICD-10-CM

## 2023-07-11 DIAGNOSIS — R140 Abdominal distension (gaseous): Secondary | ICD-10-CM

## 2023-07-11 DIAGNOSIS — R195 Other fecal abnormalities: Secondary | ICD-10-CM

## 2023-07-11 NOTE — Telephone Encounter (Signed)
 Tyler [name], Let the patient know that his creatinine number if it is 1.94 is higher than it was when we just last checked it. In the past year he has had levels as high as 1.8 but then they improved earlier this year. I will put his PCP on here as well, but he would be ideal for him to discuss things further with his primary care provider in regards to kidney function. Thanks. GM  FYI RWT on mutual patient.  He is wondering about how this compares to other labs in the past and his experimental stroke drug.

## 2023-07-14 LAB — OVA AND PARASITE EXAMINATION

## 2023-07-18 ENCOUNTER — Ambulatory Visit: Payer: Self-pay | Admitting: Gastroenterology

## 2023-07-18 ENCOUNTER — Other Ambulatory Visit (INDEPENDENT_AMBULATORY_CARE_PROVIDER_SITE_OTHER)

## 2023-07-18 DIAGNOSIS — K900 Celiac disease: Secondary | ICD-10-CM

## 2023-07-18 DIAGNOSIS — E876 Hypokalemia: Secondary | ICD-10-CM | POA: Diagnosis not present

## 2023-07-18 LAB — BASIC METABOLIC PANEL WITH GFR
BUN: 17 mg/dL (ref 6–23)
CO2: 26 meq/L (ref 19–32)
Calcium: 8.5 mg/dL (ref 8.4–10.5)
Chloride: 105 meq/L (ref 96–112)
Creatinine, Ser: 1.27 mg/dL (ref 0.40–1.50)
GFR: 57.99 mL/min — ABNORMAL LOW (ref >60.00)
Glucose, Bld: 104 mg/dL — ABNORMAL HIGH (ref 70–99)
Potassium: 3.6 meq/L (ref 3.5–5.1)
Sodium: 142 meq/L (ref 135–145)

## 2023-07-19 LAB — IGA: Immunoglobulin A: 363 mg/dL — ABNORMAL HIGH (ref 70–320)

## 2023-07-21 ENCOUNTER — Ambulatory Visit (INDEPENDENT_AMBULATORY_CARE_PROVIDER_SITE_OTHER)

## 2023-07-21 ENCOUNTER — Ambulatory Visit (HOSPITAL_COMMUNITY)
Admission: RE | Admit: 2023-07-21 | Discharge: 2023-07-21 | Disposition: A | Discharge status: Home / Self Care | Source: Ambulatory Visit | Attending: Gastroenterology | Admitting: Gastroenterology

## 2023-07-21 DIAGNOSIS — K529 Noninfective gastroenteritis and colitis, unspecified: Secondary | ICD-10-CM | POA: Diagnosis not present

## 2023-07-21 DIAGNOSIS — R195 Other fecal abnormalities: Secondary | ICD-10-CM | POA: Insufficient documentation

## 2023-07-21 DIAGNOSIS — R140 Abdominal distension (gaseous): Secondary | ICD-10-CM | POA: Diagnosis not present

## 2023-07-21 DIAGNOSIS — K900 Celiac disease: Secondary | ICD-10-CM | POA: Diagnosis not present

## 2023-07-21 DIAGNOSIS — J3081 Allergic rhinitis due to animal (cat) (dog) hair and dander: Secondary | ICD-10-CM | POA: Diagnosis not present

## 2023-07-21 DIAGNOSIS — J301 Allergic rhinitis due to pollen: Secondary | ICD-10-CM | POA: Diagnosis not present

## 2023-07-21 DIAGNOSIS — I639 Cerebral infarction, unspecified: Secondary | ICD-10-CM | POA: Diagnosis not present

## 2023-07-21 DIAGNOSIS — K5730 Diverticulosis of large intestine without perforation or abscess without bleeding: Secondary | ICD-10-CM | POA: Diagnosis not present

## 2023-07-21 DIAGNOSIS — K4090 Unilateral inguinal hernia, without obstruction or gangrene, not specified as recurrent: Secondary | ICD-10-CM | POA: Diagnosis not present

## 2023-07-21 DIAGNOSIS — R197 Diarrhea, unspecified: Secondary | ICD-10-CM | POA: Insufficient documentation

## 2023-07-21 DIAGNOSIS — K429 Umbilical hernia without obstruction or gangrene: Secondary | ICD-10-CM | POA: Diagnosis not present

## 2023-07-21 DIAGNOSIS — J3089 Other allergic rhinitis: Secondary | ICD-10-CM | POA: Diagnosis not present

## 2023-07-21 DIAGNOSIS — N400 Enlarged prostate without lower urinary tract symptoms: Secondary | ICD-10-CM | POA: Diagnosis not present

## 2023-07-21 LAB — CUP PACEART REMOTE DEVICE CHECK
Date Time Interrogation Session: 20250625233324
Implantable Pulse Generator Implant Date: 20231219

## 2023-07-21 MED ORDER — IOHEXOL 300 MG/ML  SOLN
100.0000 mL | Freq: Once | INTRAMUSCULAR | Status: AC | PRN
  Administered 2023-07-21: 100 mL via INTRAVENOUS

## 2023-07-24 ENCOUNTER — Ambulatory Visit: Payer: Self-pay | Admitting: Cardiology

## 2023-07-28 DIAGNOSIS — J3081 Allergic rhinitis due to animal (cat) (dog) hair and dander: Secondary | ICD-10-CM | POA: Diagnosis not present

## 2023-07-28 DIAGNOSIS — J301 Allergic rhinitis due to pollen: Secondary | ICD-10-CM | POA: Diagnosis not present

## 2023-07-28 DIAGNOSIS — J3089 Other allergic rhinitis: Secondary | ICD-10-CM | POA: Diagnosis not present

## 2023-08-04 ENCOUNTER — Ambulatory Visit: Payer: Self-pay | Admitting: Gastroenterology

## 2023-08-04 DIAGNOSIS — J3081 Allergic rhinitis due to animal (cat) (dog) hair and dander: Secondary | ICD-10-CM | POA: Diagnosis not present

## 2023-08-04 DIAGNOSIS — J3089 Other allergic rhinitis: Secondary | ICD-10-CM | POA: Diagnosis not present

## 2023-08-04 DIAGNOSIS — J301 Allergic rhinitis due to pollen: Secondary | ICD-10-CM | POA: Diagnosis not present

## 2023-08-04 LAB — CALPROTECTIN, FECAL

## 2023-08-04 LAB — SPECIMEN STATUS REPORT

## 2023-08-08 DIAGNOSIS — M1711 Unilateral primary osteoarthritis, right knee: Secondary | ICD-10-CM | POA: Diagnosis not present

## 2023-08-08 NOTE — Progress Notes (Signed)
 Carelink Summary Report / Loop Recorder

## 2023-08-11 DIAGNOSIS — J3089 Other allergic rhinitis: Secondary | ICD-10-CM | POA: Diagnosis not present

## 2023-08-11 DIAGNOSIS — J3081 Allergic rhinitis due to animal (cat) (dog) hair and dander: Secondary | ICD-10-CM | POA: Diagnosis not present

## 2023-08-11 DIAGNOSIS — J301 Allergic rhinitis due to pollen: Secondary | ICD-10-CM | POA: Diagnosis not present

## 2023-08-18 DIAGNOSIS — J301 Allergic rhinitis due to pollen: Secondary | ICD-10-CM | POA: Diagnosis not present

## 2023-08-18 DIAGNOSIS — J3089 Other allergic rhinitis: Secondary | ICD-10-CM | POA: Diagnosis not present

## 2023-08-18 DIAGNOSIS — J3081 Allergic rhinitis due to animal (cat) (dog) hair and dander: Secondary | ICD-10-CM | POA: Diagnosis not present

## 2023-08-22 ENCOUNTER — Ambulatory Visit

## 2023-08-22 DIAGNOSIS — I639 Cerebral infarction, unspecified: Secondary | ICD-10-CM | POA: Diagnosis not present

## 2023-08-23 LAB — CUP PACEART REMOTE DEVICE CHECK
Date Time Interrogation Session: 20250727233734
Implantable Pulse Generator Implant Date: 20231219

## 2023-08-24 ENCOUNTER — Ambulatory Visit: Payer: Self-pay | Admitting: Cardiology

## 2023-08-25 DIAGNOSIS — J301 Allergic rhinitis due to pollen: Secondary | ICD-10-CM | POA: Diagnosis not present

## 2023-08-25 DIAGNOSIS — J3089 Other allergic rhinitis: Secondary | ICD-10-CM | POA: Diagnosis not present

## 2023-08-25 DIAGNOSIS — J3081 Allergic rhinitis due to animal (cat) (dog) hair and dander: Secondary | ICD-10-CM | POA: Diagnosis not present

## 2023-08-26 ENCOUNTER — Ambulatory Visit

## 2023-08-26 ENCOUNTER — Encounter: Payer: Self-pay | Admitting: Gastroenterology

## 2023-08-26 VITALS — Ht 70.0 in | Wt 210.0 lb

## 2023-08-26 DIAGNOSIS — K900 Celiac disease: Secondary | ICD-10-CM

## 2023-08-26 DIAGNOSIS — K2970 Gastritis, unspecified, without bleeding: Secondary | ICD-10-CM

## 2023-08-26 NOTE — Progress Notes (Signed)
 No egg or soy allergy  known to patient  No issues known to pt with past sedation with any surgeries or procedures Patient denies ever being told they had issues or difficulty with intubation  No FH of Malignant Hyperthermia Pt is not on diet pills Pt is not on  home 02  CPAP: yes  Pt is not on blood thinners  No A fib or A flutter Have any cardiac testing pending-- no pt does have loop recorder  LOA: independent   Patient's chart reviewed by Norleen Schillings CNRA prior to previsit and patient appropriate for the [facility].  Previsit completed and red dot placed by patient's name on their procedure day (on provider's schedule).     PV completed with patient. Prep instructions sent via mychart and home address.

## 2023-09-09 DIAGNOSIS — J3089 Other allergic rhinitis: Secondary | ICD-10-CM | POA: Diagnosis not present

## 2023-09-09 DIAGNOSIS — J301 Allergic rhinitis due to pollen: Secondary | ICD-10-CM | POA: Diagnosis not present

## 2023-09-09 DIAGNOSIS — J3081 Allergic rhinitis due to animal (cat) (dog) hair and dander: Secondary | ICD-10-CM | POA: Diagnosis not present

## 2023-09-13 ENCOUNTER — Other Ambulatory Visit (INDEPENDENT_AMBULATORY_CARE_PROVIDER_SITE_OTHER)

## 2023-09-13 ENCOUNTER — Ambulatory Visit: Admitting: Gastroenterology

## 2023-09-13 ENCOUNTER — Other Ambulatory Visit: Payer: Self-pay | Admitting: Emergency Medicine

## 2023-09-13 ENCOUNTER — Encounter: Payer: Self-pay | Admitting: Gastroenterology

## 2023-09-13 VITALS — BP 121/77 | HR 65 | Temp 98.1°F | Resp 20 | Ht 70.0 in | Wt 210.0 lb

## 2023-09-13 DIAGNOSIS — K900 Celiac disease: Secondary | ICD-10-CM

## 2023-09-13 DIAGNOSIS — K317 Polyp of stomach and duodenum: Secondary | ICD-10-CM

## 2023-09-13 DIAGNOSIS — I10 Essential (primary) hypertension: Secondary | ICD-10-CM | POA: Diagnosis not present

## 2023-09-13 DIAGNOSIS — K449 Diaphragmatic hernia without obstruction or gangrene: Secondary | ICD-10-CM

## 2023-09-13 DIAGNOSIS — K2981 Duodenitis with bleeding: Secondary | ICD-10-CM | POA: Diagnosis not present

## 2023-09-13 DIAGNOSIS — K2289 Other specified disease of esophagus: Secondary | ICD-10-CM | POA: Diagnosis not present

## 2023-09-13 DIAGNOSIS — K2980 Duodenitis without bleeding: Secondary | ICD-10-CM | POA: Diagnosis not present

## 2023-09-13 DIAGNOSIS — K2970 Gastritis, unspecified, without bleeding: Secondary | ICD-10-CM

## 2023-09-13 DIAGNOSIS — K3189 Other diseases of stomach and duodenum: Secondary | ICD-10-CM

## 2023-09-13 DIAGNOSIS — K8681 Exocrine pancreatic insufficiency: Secondary | ICD-10-CM | POA: Diagnosis not present

## 2023-09-13 DIAGNOSIS — G4730 Sleep apnea, unspecified: Secondary | ICD-10-CM | POA: Diagnosis not present

## 2023-09-13 DIAGNOSIS — J45909 Unspecified asthma, uncomplicated: Secondary | ICD-10-CM | POA: Diagnosis not present

## 2023-09-13 MED ORDER — SODIUM CHLORIDE 0.9 % IV SOLN: 500.0000 mL | Freq: Once | INTRAVENOUS | Status: DC

## 2023-09-13 MED ORDER — ESOMEPRAZOLE MAGNESIUM 20 MG PO CPDR: 20.0000 mg | DELAYED_RELEASE_CAPSULE | Freq: Every day | ORAL | 3 refills | Status: AC

## 2023-09-13 NOTE — Progress Notes (Signed)
 Called to room to assist during endoscopic procedure.  Patient ID and intended procedure confirmed with present staff. Received instructions for my participation in the procedure from the performing physician.

## 2023-09-13 NOTE — Op Note (Signed)
 [facility] Patient Name: Tyler [name] Procedure Date: 09/13/2023 9:31 AM MRN: [id number] Endoscopist: Aloha Finner , MD, [id number] Age: 68 Referring MD:  Date of Birth: 10-20-1955 Gender: Male Account #: [id number] Procedure:                Upper GI endoscopy Indications:              Follow-up of celiac disease Medicines:                Monitored Anesthesia Care Procedure:                Pre-Anesthesia Assessment:                           - Prior to the procedure, a History and Physical                            was performed, and patient medications and                            allergies were reviewed. The patient's tolerance of                            previous anesthesia was also reviewed. The risks                            and benefits of the procedure and the sedation                            options and risks were discussed with the patient.                            All questions were answered, and informed consent                            was obtained. Prior Anticoagulants: The patient has                            taken no anticoagulant or antiplatelet agents                            except for aspirin . ASA Grade Assessment: II - A                            patient with mild systemic disease. After reviewing                            the risks and benefits, the patient was deemed in                            satisfactory condition to undergo the procedure.                           After obtaining informed consent, the endoscope was  passed under direct vision. Throughout the                            procedure, the patient's blood pressure, pulse, and                            oxygen saturations were monitored continuously. The                            GIF [id number] #[id number] was introduced through the                            mouth, and advanced to the second part of duodenum.                             The upper GI endoscopy was accomplished without                            difficulty. The patient tolerated the procedure. Scope In: Scope Out: Findings:                 No gross lesions were noted in the entire esophagus.                           The Z-line was irregular and was found 38 cm from                            the incisors.                           A 3 cm hiatal hernia was present.                           Patchy mildly erythematous mucosa was found in the                            entire examined stomach.                           Two 5 to 7 mm sessile polyps were found in the                            duodenal sweep and in the second portion of the                            duodenum. These polyps were removed with a cold                            snare. Resection and retrieval were complete.                           Decreased folds were found in the duodenal bulb,  decreased folds were found in the first portion of                            the duodenum, decreased folds were found in the                            second portion of the duodenum, scalloped mucosa                            was found in the duodenal bulb, scalloped mucosa                            was found in the first portion of the duodenum and                            scalloped mucosa was found in the second portion of                            the duodenum. These findings overall, are improved                            compared to prior endoscopy when we diagnosed                            celiac. Biopsies were taken with a cold forceps, 1                            piece at a time, for histology. Complications:            No immediate complications. Estimated Blood Loss:     Estimated blood loss was minimal. Impression:               - No gross lesions in the entire esophagus. Z-line                            irregular, 38 cm from the incisors.                            - 3 cm hiatal hernia.                           - Erythematous mucosa in the stomach.                           - Two duodenal polyps. Resected and retrieved.                           - Duodenal mucosal changes seen, suspicious for                            celiac disease. This appears improved from prior.                            Biopsied. Recommendation:           -  The patient will be observed post-procedure,                            until all discharge criteria are met.                           - Discharge patient to home.                           - Patient has a contact number available for                            emergencies. The signs and symptoms of potential                            delayed complications were discussed with the                            patient. Return to normal activities tomorrow.                            Written discharge instructions were provided to the                            patient.                           - Gluten free diet.                           - Await pathology results.                           - Repeat TTG IgA and IgA levels.                           - Repeat upper endoscopy for surveillance based on                            pathology results. If patient is found to have                            adenomatous duodenal polyps, surveillance would be                            recommended within a year.                           - Recommend patient repeat fecal elastase testing                            to evaluate and see whether he does truly have                            exocrine pancreas insufficiency or if this was a  false low lab as a result of the patient's looser                            stools at the time of this being performed. The                            stool test has been ordered for him to pick up as                            able.                           - With  patient still having some element of                            gastritis, I recommend the patient continue once                            daily PPI. If he feels that things are okay in                            current status I would like to try 20 mg daily                            rather than 40 mg daily, prescription can be sent                            or can be obtained OTC.                           - Continue present medications otherwise.                           - The findings and recommendations were discussed                            with the patient.                           - The findings and recommendations were discussed                            with the patient's family. Aloha Finner, MD 09/13/2023 10:05:42 AM

## 2023-09-13 NOTE — Patient Instructions (Addendum)
-  Await pathology results -Gluten free diet recommended -Repeat upper endoscopy based on pathology results. Please call office to schedule once you have your results back. -Continue once daily PPI   YOU HAD AN ENDOSCOPIC PROCEDURE TODAY AT THE [facility]:   Refer to the procedure report that was given to you for any specific questions about what was found during the examination.  If the procedure report does not answer your questions, please call your gastroenterologist to clarify.  If you requested that your care partner not be given the details of your procedure findings, then the procedure report has been included in a sealed envelope for you to review at your convenience later.  YOU SHOULD EXPECT: Some feelings of bloating in the abdomen. Passage of more gas than usual.  Walking can help get rid of the air that was put into your GI tract during the procedure and reduce the bloating. If you had a lower endoscopy (such as a colonoscopy or flexible sigmoidoscopy) you may notice spotting of blood in your stool or on the toilet paper. If you underwent a bowel prep for your procedure, you may not have a normal bowel movement for a few days.  Please Note:  You might notice some irritation and congestion in your nose or some drainage.  This is from the oxygen used during your procedure.  There is no need for concern and it should clear up in a day or so.  SYMPTOMS TO REPORT IMMEDIATELY:  Following upper endoscopy (EGD)  Vomiting of blood or coffee ground material  New chest pain or pain under the shoulder blades  Painful or persistently difficult swallowing  New shortness of breath  Fever of 100F or higher  Black, tarry-looking stools  For urgent or emergent issues, a gastroenterologist can be reached at any hour by calling [phone]. Do not use MyChart messaging for urgent concerns.    DIET:  We do recommend a small meal at first, but then you may proceed to your regular  diet.  Drink plenty of fluids but you should avoid alcoholic beverages for 24 hours.  ACTIVITY:  You should plan to take it easy for the rest of today and you should NOT DRIVE or use heavy machinery until tomorrow (because of the sedation medicines used during the test).    FOLLOW UP: Our staff will call the number listed on your records the next business day following your procedure.  We will call around 7:15- 8:00 am to check on you and address any questions or concerns that you may have regarding the information given to you following your procedure. If we do not reach you, we will leave a message.     If any biopsies were taken you will be contacted by phone or by letter within the next 1-3 weeks.  Please call us  at [phone] if you have not heard about the biopsies in 3 weeks.    SIGNATURES/CONFIDENTIALITY: You and/or your care partner have signed paperwork which will be entered into your electronic medical record.  These signatures attest to the fact that that the information above on your After Visit Summary has been reviewed and is understood.  Full responsibility of the confidentiality of this discharge information lies with you and/or your care-partner.

## 2023-09-13 NOTE — Progress Notes (Unsigned)
 GASTROENTEROLOGY PROCEDURE H&P NOTE   Primary Care Physician: Tisovec, Charlie ORN, MD  HPI: Tyler [name] is a 68 y.o. male who presents for EGD for follow-up of celiac disease.  Past Medical History:  Diagnosis Date   Arthritis    Asthma    Complication of anesthesia 08/17/2016   pt had urinary retention following last sinus surgery that required him to go to the ED to be in and out cathedx1   History of hiatal hernia    Hypertension    Sleep apnea    Stroke [facility])    Past Surgical History:  Procedure Laterality Date   LOOP RECORDER INSERTION N/A 01/12/2022   Procedure: LOOP RECORDER INSERTION;  Surgeon: Inocencio Soyla Lunger, MD;  Location: [facility] INVASIVE CV LAB;  Service: Cardiovascular;  Laterality: N/A;   NASAL SINUS SURGERY  01/26/2000   ROTATOR CUFF REPAIR     SEPTOPLASTY WITH ETHMOIDECTOMY, AND MAXILLARY ANTROSTOMY N/A 08/19/2016   Procedure: SEPTOPLASTY WITH ETHMOIDECTOMY, AND MAXILLARY ANTROSTOMY;  Surgeon: Arlana Arnt, MD;  Location: [facility] OR;  Service: ENT;  Laterality: N/A;   TURBINATE REDUCTION N/A 08/19/2016   Procedure: BILATERAL TURBINATE REDUCTION;  Surgeon: Arlana Arnt, MD;  Location: [facility] OR;  Service: ENT;  Laterality: N/A;   WISDOM TOOTH EXTRACTION     Current Outpatient Medications  Medication Sig Dispense Refill   amLODipine  (NORVASC ) 10 MG tablet Take 10 mg by mouth daily.     aspirin  EC 81 MG tablet Take 1 tablet (81 mg total) by mouth daily. Swallow whole. 30 tablet 12   Calcium -Magnesium -Zinc  (CAL-MAG-ZINC  PO) Take 2 tablets by mouth daily.     Cholecalciferol (VITAMIN D -3 PO) Take 1 tablet by mouth daily.     Collagen-Vitamin C-Biotin (COLLAGEN PO) Take by mouth. Type II Collagen-40mg  once daily     Cyanocobalamin  (B-12 PO) Take 1 tablet by mouth daily.     esomeprazole  (NEXIUM ) 40 MG capsule Take 1 capsule (40 mg total) by mouth daily at 12 noon. 90 capsule 1   ferrous sulfate 325 (65 FE) MG tablet Take 325 mg by mouth daily.      fluticasone  (FLONASE) 50 MCG/ACT nasal spray Place 2 sprays into both nostrils daily.     fluticasone furoate-vilanterol (BREO ELLIPTA) 200-25 MCG/ACT AEPB Inhale 1 puff into the lungs daily.     hydrochlorothiazide  (HYDRODIURIL ) 12.5 MG tablet Take 12.5 mg by mouth daily.     Investigational - Study Medication Take 1 tablet by mouth in the morning and at bedtime. Study name: Librexia-Stroke Study Additional study details: Milvexian 25 mg or placebo     levocetirizine (XYZAL) 5 MG tablet Take 5 mg by mouth every evening.     losartan (COZAAR) 100 MG tablet Take 100 mg by mouth daily.     montelukast (SINGULAIR) 10 MG tablet Take 10 mg by mouth at bedtime.     Omega-3 Fatty Acids (FISH OIL PO) Take 1,400 mg by mouth daily.     rosuvastatin  (CRESTOR ) 5 MG tablet Take 1 tablet (5 mg total) by mouth daily. 90 tablet 0   sildenafil (VIAGRA) 50 MG tablet Take 50 mg by mouth as needed.     Study - LIBREXIA-STROKE - milvexian 25 mg or placebo tablet (PI-Sethi) Take 1 tablet by mouth 2 (two) times daily. For Investigational Use Only. Take 1 tablet by mouth twice a day with or without food. Please bring bottle to every visit. Please contact [facility] for any questions or concerns. 420 tablet 0  Tamsulosin  HCl (FLOMAX ) 0.4 MG CAPS Take 0.4 mg by mouth daily.     albuterol  (VENTOLIN  HFA) 108 (90 Base) MCG/ACT inhaler Inhale 1 puff into the lungs as needed.     EPINEPHrine  0.3 mg/0.3 mL IJ SOAJ injection INJECT AS DIRECTED AS NEEDED FOR SYSTEMIC REACTIONS FOR 30 DAYS     Current Facility-Administered Medications  Medication Dose Route Frequency Provider Last Rate Last Admin   0.9 %  sodium chloride  infusion  500 mL Intravenous Once Mansouraty, Jerret Mcbane Jr., MD        Current Outpatient Medications:    amLODipine  (NORVASC ) 10 MG tablet, Take 10 mg by mouth daily., Disp: , Rfl:    aspirin  EC 81 MG tablet, Take 1 tablet (81 mg total) by mouth daily. Swallow whole., Disp: 30 tablet, Rfl: 12    Calcium -Magnesium -Zinc  (CAL-MAG-ZINC  PO), Take 2 tablets by mouth daily., Disp: , Rfl:    Cholecalciferol (VITAMIN D -3 PO), Take 1 tablet by mouth daily., Disp: , Rfl:    Collagen-Vitamin C-Biotin (COLLAGEN PO), Take by mouth. Type II Collagen-40mg  once daily, Disp: , Rfl:    Cyanocobalamin  (B-12 PO), Take 1 tablet by mouth daily., Disp: , Rfl:    esomeprazole  (NEXIUM ) 40 MG capsule, Take 1 capsule (40 mg total) by mouth daily at 12 noon., Disp: 90 capsule, Rfl: 1   ferrous sulfate 325 (65 FE) MG tablet, Take 325 mg by mouth daily. , Disp: , Rfl:    fluticasone (FLONASE) 50 MCG/ACT nasal spray, Place 2 sprays into both nostrils daily., Disp: , Rfl:    fluticasone furoate-vilanterol (BREO ELLIPTA) 200-25 MCG/ACT AEPB, Inhale 1 puff into the lungs daily., Disp: , Rfl:    hydrochlorothiazide  (HYDRODIURIL ) 12.5 MG tablet, Take 12.5 mg by mouth daily., Disp: , Rfl:    Investigational - Study Medication, Take 1 tablet by mouth in the morning and at bedtime. Study name: Librexia-Stroke Study Additional study details: Milvexian 25 mg or placebo, Disp: , Rfl:    levocetirizine (XYZAL) 5 MG tablet, Take 5 mg by mouth every evening., Disp: , Rfl:    losartan (COZAAR) 100 MG tablet, Take 100 mg by mouth daily., Disp: , Rfl:    montelukast (SINGULAIR) 10 MG tablet, Take 10 mg by mouth at bedtime., Disp: , Rfl:    Omega-3 Fatty Acids (FISH OIL PO), Take 1,400 mg by mouth daily., Disp: , Rfl:    rosuvastatin  (CRESTOR ) 5 MG tablet, Take 1 tablet (5 mg total) by mouth daily., Disp: 90 tablet, Rfl: 0   sildenafil (VIAGRA) 50 MG tablet, Take 50 mg by mouth as needed., Disp: , Rfl:    Study - LIBREXIA-STROKE - milvexian 25 mg or placebo tablet (PI-Sethi), Take 1 tablet by mouth 2 (two) times daily. For Investigational Use Only. Take 1 tablet by mouth twice a day with or without food. Please bring bottle to every visit. Please contact [facility] for any questions or concerns., Disp: 420 tablet, Rfl: 0    Tamsulosin  HCl (FLOMAX ) 0.4 MG CAPS, Take 0.4 mg by mouth daily., Disp: , Rfl:    albuterol  (VENTOLIN  HFA) 108 (90 Base) MCG/ACT inhaler, Inhale 1 puff into the lungs as needed., Disp: , Rfl:    EPINEPHrine  0.3 mg/0.3 mL IJ SOAJ injection, INJECT AS DIRECTED AS NEEDED FOR SYSTEMIC REACTIONS FOR 30 DAYS, Disp: , Rfl:   Current Facility-Administered Medications:    0.9 %  sodium chloride  infusion, 500 mL, Intravenous, Once, Mansouraty, Aloha Raddle., MD Allergies  Allergen Reactions   Lisinopril Other (  See Comments)    cough   Augmentin [Amoxicillin-Pot Clavulanate] Rash    Has patient had a PCN reaction causing immediate rash, facial/tongue/throat swelling, SOB or lightheadedness with hypotension: Yes Has patient had a PCN reaction causing severe rash involving mucus membranes or skin necrosis: No Has patient had a PCN reaction that required hospitalization: No Has patient had a PCN reaction occurring within the last 10 years: Yes If all of the above answers are NO, then may proceed with Cephalosporin use.    Doxycycline Rash and Dermatitis   Family History  Problem Relation Age of Onset   Heart failure Mother    Aneurysm Father    Colon cancer Neg Hx    Esophageal cancer Neg Hx    Rectal cancer Neg Hx    Stomach cancer Neg Hx    Sleep apnea Neg Hx    Pancreatic cancer Neg Hx    Inflammatory bowel disease Neg Hx    Liver disease Neg Hx    Social History   Socioeconomic History   Marital status: Married    Spouse name: Not on file   Number of children: 2   Years of education: Not on file   Highest education level: Not on file  Occupational History   Occupation: Teacher  Tobacco Use   Smoking status: Some Days    Types: Cigars   Smokeless tobacco: Never   Tobacco comments:    occasional cigar  Vaping Use   Vaping status: Never Used  Substance and Sexual Activity   Alcohol  use: Yes    Alcohol /week: 4.0 standard drinks of alcohol     Types: 4 Glasses of wine per week    Drug use: No   Sexual activity: Not on file  Other Topics Concern   Not on file  Social History Narrative   Not on file   Social Drivers of Health   Financial Resource Strain: Not on file  Food Insecurity: Low Risk  (06/28/2023)   Received from [facility]   Hunger Vital Sign    Within the past 12 months, you worried that your food would run out before you got money to buy more: Never true    Within the past 12 months, the food you bought just didn't last and you didn't have money to get more. : Never true  Transportation Needs: No Transportation Needs (06/28/2023)   Received from [employer]    In the past 12 months, has lack of reliable transportation kept you from medical appointments, meetings, work or from getting things needed for daily living? : No  Physical Activity: Not on file  Stress: Not on file  Social Connections: Not on file  Intimate Partner Violence: Not on file    Physical Exam: Today's Vitals   09/13/23 0912  BP: (!) 117/55  Pulse: 64  Temp: 98.1 F (36.7 C)  TempSrc: Temporal  SpO2: 96%  Weight: 210 lb (95.3 kg)  Height: 5' 10 (1.778 m)   Body mass index is 30.13 kg/m. GEN: NAD EYE: Sclerae anicteric ENT: MMM CV: Non-tachycardic GI: Soft, NT/ND NEURO:  Alert & Oriented x 3  Lab Results: No results for input(s): WBC, HGB, HCT, PLT in the last 72 hours. BMET No results for input(s): NA, K, CL, CO2, GLUCOSE, BUN, CREATININE, CALCIUM  in the last 72 hours. LFT No results for input(s): PROT, ALBUMIN, AST, ALT, ALKPHOS, BILITOT, BILIDIR, IBILI in the last 72 hours. PT/INR No results for input(s): LABPROT, INR in the  last 72 hours.   Impression / Plan: This is a 68 y.o.male  who presents for EGD for follow-up of celiac disease.  The risks and benefits of endoscopic evaluation/treatment were discussed with the patient and/or family; these include but are not limited to the risk of  perforation, infection, bleeding, missed lesions, lack of diagnosis, severe illness requiring hospitalization, as well as anesthesia and sedation related illnesses.  The patient's history has been reviewed, patient examined, no change in status, and deemed stable for procedure.  The patient and/or family is agreeable to proceed.    Aloha Finner, MD [facility] Office # [phone]

## 2023-09-13 NOTE — Progress Notes (Unsigned)
 Pt's states no medical or surgical changes since previsit or office visit.

## 2023-09-13 NOTE — Progress Notes (Unsigned)
 Sedate, gd SR, tolerated procedure well, VSS, report to RN

## 2023-09-14 ENCOUNTER — Telehealth: Payer: Self-pay

## 2023-09-14 LAB — TISSUE TRANSGLUTAMINASE, IGA: (tTG) Ab, IgA: 4.7 U/mL

## 2023-09-14 NOTE — Telephone Encounter (Signed)
  Follow up Call-     09/13/2023    9:13 AM 05/10/2023   10:47 AM 05/10/2023   10:44 AM 12/14/2022   11:29 AM  Call back number  Post procedure Call Back phone  # [phone]  [phone]  Permission to leave phone message Yes  Yes Yes     Patient questions:  Do you have a fever, pain , or abdominal swelling? No. Pain Score  0 *  Have you tolerated food without any problems? Yes.    Have you been able to return to your normal activities? Yes.    Do you have any questions about your discharge instructions: Diet   No. Medications  No. Follow up visit  No.  Do you have questions or concerns about your Care? No.  Actions: * If pain score is 4 or above: No action needed, pain <4.

## 2023-09-15 ENCOUNTER — Ambulatory Visit: Payer: Self-pay | Admitting: Gastroenterology

## 2023-09-15 DIAGNOSIS — J301 Allergic rhinitis due to pollen: Secondary | ICD-10-CM | POA: Diagnosis not present

## 2023-09-15 DIAGNOSIS — J3089 Other allergic rhinitis: Secondary | ICD-10-CM | POA: Diagnosis not present

## 2023-09-15 DIAGNOSIS — J3081 Allergic rhinitis due to animal (cat) (dog) hair and dander: Secondary | ICD-10-CM | POA: Diagnosis not present

## 2023-09-15 LAB — IGA: Immunoglobulin A: 388 mg/dL — ABNORMAL HIGH (ref 70–320)

## 2023-09-16 LAB — SURGICAL PATHOLOGY

## 2023-09-19 LAB — PANCREATIC ELASTASE, FECAL: Pancreatic Elastase-1, Stool: 270 ug/g (ref >200)

## 2023-09-19 NOTE — Progress Notes (Signed)
 Recall has been entered

## 2023-09-20 NOTE — Telephone Encounter (Signed)
 Follow up call to pt, no answer.

## 2023-09-20 NOTE — Progress Notes (Signed)
 Pt advised via MyChart.

## 2023-09-22 ENCOUNTER — Ambulatory Visit

## 2023-09-22 DIAGNOSIS — I639 Cerebral infarction, unspecified: Secondary | ICD-10-CM

## 2023-09-22 DIAGNOSIS — J3081 Allergic rhinitis due to animal (cat) (dog) hair and dander: Secondary | ICD-10-CM | POA: Diagnosis not present

## 2023-09-22 DIAGNOSIS — J3089 Other allergic rhinitis: Secondary | ICD-10-CM | POA: Diagnosis not present

## 2023-09-22 DIAGNOSIS — J301 Allergic rhinitis due to pollen: Secondary | ICD-10-CM | POA: Diagnosis not present

## 2023-09-22 LAB — CUP PACEART REMOTE DEVICE CHECK
Date Time Interrogation Session: 20250827232819
Implantable Pulse Generator Implant Date: 20231219

## 2023-09-29 ENCOUNTER — Ambulatory Visit: Payer: Self-pay | Admitting: Cardiology

## 2023-09-29 DIAGNOSIS — J3089 Other allergic rhinitis: Secondary | ICD-10-CM | POA: Diagnosis not present

## 2023-09-29 DIAGNOSIS — J3081 Allergic rhinitis due to animal (cat) (dog) hair and dander: Secondary | ICD-10-CM | POA: Diagnosis not present

## 2023-09-29 DIAGNOSIS — J301 Allergic rhinitis due to pollen: Secondary | ICD-10-CM | POA: Diagnosis not present

## 2023-10-04 DIAGNOSIS — J3089 Other allergic rhinitis: Secondary | ICD-10-CM | POA: Diagnosis not present

## 2023-10-04 DIAGNOSIS — J301 Allergic rhinitis due to pollen: Secondary | ICD-10-CM | POA: Diagnosis not present

## 2023-10-04 DIAGNOSIS — J3081 Allergic rhinitis due to animal (cat) (dog) hair and dander: Secondary | ICD-10-CM | POA: Diagnosis not present

## 2023-10-04 NOTE — Progress Notes (Signed)
 Carelink Summary Report / Loop Recorder

## 2023-10-06 DIAGNOSIS — J301 Allergic rhinitis due to pollen: Secondary | ICD-10-CM | POA: Diagnosis not present

## 2023-10-06 DIAGNOSIS — J3089 Other allergic rhinitis: Secondary | ICD-10-CM | POA: Diagnosis not present

## 2023-10-06 DIAGNOSIS — J3081 Allergic rhinitis due to animal (cat) (dog) hair and dander: Secondary | ICD-10-CM | POA: Diagnosis not present

## 2023-10-06 NOTE — Progress Notes (Signed)
 Remote Loop Recorder Transmission

## 2023-10-13 DIAGNOSIS — J301 Allergic rhinitis due to pollen: Secondary | ICD-10-CM | POA: Diagnosis not present

## 2023-10-13 DIAGNOSIS — J3089 Other allergic rhinitis: Secondary | ICD-10-CM | POA: Diagnosis not present

## 2023-10-13 DIAGNOSIS — J3081 Allergic rhinitis due to animal (cat) (dog) hair and dander: Secondary | ICD-10-CM | POA: Diagnosis not present

## 2023-10-14 DIAGNOSIS — R051 Acute cough: Secondary | ICD-10-CM | POA: Diagnosis not present

## 2023-10-14 DIAGNOSIS — J029 Acute pharyngitis, unspecified: Secondary | ICD-10-CM | POA: Diagnosis not present

## 2023-10-14 DIAGNOSIS — R350 Frequency of micturition: Secondary | ICD-10-CM | POA: Diagnosis not present

## 2023-10-14 DIAGNOSIS — J302 Other seasonal allergic rhinitis: Secondary | ICD-10-CM | POA: Diagnosis not present

## 2023-10-14 DIAGNOSIS — K900 Celiac disease: Secondary | ICD-10-CM | POA: Diagnosis not present

## 2023-10-14 DIAGNOSIS — R0981 Nasal congestion: Secondary | ICD-10-CM | POA: Diagnosis not present

## 2023-10-14 DIAGNOSIS — Z1152 Encounter for screening for COVID-19: Secondary | ICD-10-CM | POA: Diagnosis not present

## 2023-10-14 DIAGNOSIS — J069 Acute upper respiratory infection, unspecified: Secondary | ICD-10-CM | POA: Diagnosis not present

## 2023-10-14 DIAGNOSIS — I10 Essential (primary) hypertension: Secondary | ICD-10-CM | POA: Diagnosis not present

## 2023-10-14 DIAGNOSIS — N401 Enlarged prostate with lower urinary tract symptoms: Secondary | ICD-10-CM | POA: Diagnosis not present

## 2023-10-24 ENCOUNTER — Ambulatory Visit

## 2023-10-24 DIAGNOSIS — I639 Cerebral infarction, unspecified: Secondary | ICD-10-CM

## 2023-10-24 LAB — CUP PACEART REMOTE DEVICE CHECK
Date Time Interrogation Session: 20250928233206
Implantable Pulse Generator Implant Date: 20231219

## 2023-10-25 ENCOUNTER — Ambulatory Visit: Payer: Self-pay | Admitting: Cardiology

## 2023-10-26 NOTE — Progress Notes (Signed)
 Remote Loop Recorder Transmission

## 2023-10-27 ENCOUNTER — Encounter: Payer: Self-pay | Admitting: Adult Health

## 2023-10-27 ENCOUNTER — Ambulatory Visit: Payer: Medicare PPO | Admitting: Adult Health

## 2023-10-27 VITALS — BP 132/79 | HR 67 | Ht 70.0 in | Wt 214.6 lb

## 2023-10-27 DIAGNOSIS — J3081 Allergic rhinitis due to animal (cat) (dog) hair and dander: Secondary | ICD-10-CM | POA: Diagnosis not present

## 2023-10-27 DIAGNOSIS — G4733 Obstructive sleep apnea (adult) (pediatric): Secondary | ICD-10-CM | POA: Diagnosis not present

## 2023-10-27 DIAGNOSIS — J301 Allergic rhinitis due to pollen: Secondary | ICD-10-CM | POA: Diagnosis not present

## 2023-10-27 DIAGNOSIS — J3089 Other allergic rhinitis: Secondary | ICD-10-CM | POA: Diagnosis not present

## 2023-10-27 NOTE — Patient Instructions (Signed)
 Continue using CPAP nightly and greater than 4 hours each night If your symptoms worsen or you develop new symptoms please let us  know.

## 2023-10-27 NOTE — Progress Notes (Signed)
 PATIENT: Tyler [name] DOB: 1955/06/27  REASON FOR VISIT: follow up HISTORY FROM: patient  Chief Complaint  Patient presents with   Follow-up    Rm 4, alone.  No concerns.      HISTORY OF PRESENT ILLNESS:   Tyler [name] is a 68 y.o. male with a history of obstructive sleep apnea on CPAP. Returns today for follow-up.  Overall he is doing well.  States that he cannot sleep without the CPAP.  Currently wears the nasal pillows.  Still has a loop recorder but so far has been unremarkable.  Denies any additional strokelike symptoms follows up with his PCP on a regular basis.  Download is below     10/27/22: Tyler [name] is a 68 y.o. male with a history of OSA on CPAP. Returns today for follow-up.  Reports that the CPAP is working well for him.  He continues to find it beneficial.  His download is below.  To note he is seeing Dr. Rosemarie in research for stroke.  It was mentioned in a previous note that he did not need a regular office visit.  Patient is inquiring about refills for his Crestor  long-term-advised that this could come from his PCP     10/26/21: Mr. Tyler [name] is a 68 year old male with a history of obstructive sleep apnea on CPAP.  He returns today for follow-up.  Reports that CPAP is working well for him.  Reports he does not like to sleep without his CPAP machine.  His download is below Today 10/27/23:  Tyler [name] is a 68 year old male with a history of obstructive sleep apnea on CPAP.  His download is below.  He reports that the CPAP is working well for him.  He recently had surgery on his left foot for hammertoe.  He is in a boot today.  He returns today for evaluation.    10/23/19: Tyler [name] is a 69 year old male with a history of obstructive sleep apnea on CPAP.  His download indicates that he uses machine nightly for compliance of 100%.  He uses machine greater than 4 hours each night.  On average he uses his machine 8 hours and 38 minutes.   His residual AHI is 2.6 on 5 to 12 cm of water.  His leak in the 95th percentile is 13.6 L/min.  He reports that the CPAP is working well for him.  He does not like to sleep without it.  HISTORY 10/23/18:   Tyler [name] is a 68 year old male with a history of obstructive sleep apnea on CPAP.  He returns today for follow-up.  His download indicates that he uses machine nightly for compliance of 100%.  Every night he uses machine greater than 4 hours.  On average he uses his machine 8 hours and 25 minutes.  His residual AHI is 3.2 on 5 to 12 cm of water with EPR of 1.  His leak in the 95th percentile is 12.7 L/min.  He reports that the CPAP is working well for him.  He definitely can tell the benefit.  He returns today for an evaluation.  REVIEW OF SYSTEMS: Out of a complete 14 system review of symptoms, the patient complains only of the following symptoms, and all other reviewed systems are negative.  FSS 14 ESS 7  ALLERGIES: Allergies  Allergen Reactions   Lisinopril Other (See Comments)    cough   Augmentin [Amoxicillin-Pot Clavulanate] Rash    Has patient had a PCN reaction causing immediate rash,  facial/tongue/throat swelling, SOB or lightheadedness with hypotension: Yes Has patient had a PCN reaction causing severe rash involving mucus membranes or skin necrosis: No Has patient had a PCN reaction that required hospitalization: No Has patient had a PCN reaction occurring within the last 10 years: Yes If all of the above answers are NO, then may proceed with Cephalosporin use.    Doxycycline Rash and Dermatitis    HOME MEDICATIONS: Outpatient Medications Prior to Visit  Medication Sig Dispense Refill   albuterol  (VENTOLIN  HFA) 108 (90 Base) MCG/ACT inhaler Inhale 1 puff into the lungs as needed.     amLODipine  (NORVASC ) 10 MG tablet Take 10 mg by mouth daily.     aspirin  EC 81 MG tablet Take 1 tablet (81 mg total) by mouth daily. Swallow whole. 30 tablet 12    Calcium -Magnesium -Zinc  (CAL-MAG-ZINC  PO) Take 2 tablets by mouth daily.     Cholecalciferol (VITAMIN D -3 PO) Take 1 tablet by mouth daily.     Collagen-Vitamin C-Biotin (COLLAGEN PO) Take by mouth. Type II Collagen-40mg  once daily     Cyanocobalamin  (B-12 PO) Take 1 tablet by mouth daily.     EPINEPHrine  0.3 mg/0.3 mL IJ SOAJ injection INJECT AS DIRECTED AS NEEDED FOR SYSTEMIC REACTIONS FOR 30 DAYS     esomeprazole  (NEXIUM ) 40 MG capsule Take 1 capsule (40 mg total) by mouth daily at 12 noon. 90 capsule 1   ferrous sulfate 325 (65 FE) MG tablet Take 325 mg by mouth daily.      fluticasone (FLONASE) 50 MCG/ACT nasal spray Place 2 sprays into both nostrils daily.     fluticasone furoate-vilanterol (BREO ELLIPTA) 200-25 MCG/ACT AEPB Inhale 1 puff into the lungs daily.     hydrochlorothiazide  (HYDRODIURIL ) 12.5 MG tablet Take 12.5 mg by mouth daily.     Investigational - Study Medication Take 1 tablet by mouth in the morning and at bedtime. Study name: Librexia-Stroke Study Additional study details: Milvexian 25 mg or placebo     levocetirizine (XYZAL) 5 MG tablet Take 5 mg by mouth every evening.     losartan (COZAAR) 100 MG tablet Take 100 mg by mouth daily.     montelukast (SINGULAIR) 10 MG tablet Take 10 mg by mouth at bedtime.     Omega-3 Fatty Acids (FISH OIL PO) Take 1,400 mg by mouth daily.     rosuvastatin  (CRESTOR ) 5 MG tablet Take 1 tablet (5 mg total) by mouth daily. 90 tablet 0   sildenafil (VIAGRA) 50 MG tablet Take 50 mg by mouth as needed.     Study - LIBREXIA-STROKE - milvexian 25 mg or placebo tablet (PI-Sethi) Take 1 tablet by mouth 2 (two) times daily. For Investigational Use Only. Take 1 tablet by mouth twice a day with or without food. Please bring bottle to every visit. Please contact [facility] for any questions or concerns. 420 tablet 0   Tamsulosin  HCl (FLOMAX ) 0.4 MG CAPS Take 0.4 mg by mouth daily.     esomeprazole  (NEXIUM ) 20 MG capsule Take 1 capsule (20  mg total) by mouth daily at 12 noon. (Patient not taking: Reported on 10/27/2023) 90 capsule 3   No facility-administered medications prior to visit.    PAST MEDICAL HISTORY: Past Medical History:  Diagnosis Date   Arthritis    Asthma    Complication of anesthesia 08/17/2016   pt had urinary retention following last sinus surgery that required him to go to the ED to be in and out cathedx1   History  of hiatal hernia    Hypertension    Sleep apnea    Stroke [facility])     PAST SURGICAL HISTORY: Past Surgical History:  Procedure Laterality Date   LOOP RECORDER INSERTION N/A 01/12/2022   Procedure: LOOP RECORDER INSERTION;  Surgeon: Inocencio Soyla Lunger, MD;  Location: [facility] INVASIVE [facility] LAB;  Service: Cardiovascular;  Laterality: N/A;   NASAL SINUS SURGERY  01/26/2000   ROTATOR CUFF REPAIR     SEPTOPLASTY WITH ETHMOIDECTOMY, AND MAXILLARY ANTROSTOMY N/A 08/19/2016   Procedure: SEPTOPLASTY WITH ETHMOIDECTOMY, AND MAXILLARY ANTROSTOMY;  Surgeon: Arlana Arnt, MD;  Location: [facility] OR;  Service: ENT;  Laterality: N/A;   TURBINATE REDUCTION N/A 08/19/2016   Procedure: BILATERAL TURBINATE REDUCTION;  Surgeon: Arlana Arnt, MD;  Location: [facility] OR;  Service: ENT;  Laterality: N/A;   WISDOM TOOTH EXTRACTION      FAMILY HISTORY: Family History  Problem Relation Age of Onset   Heart failure Mother    Aneurysm Father    Colon cancer Neg Hx    Esophageal cancer Neg Hx    Rectal cancer Neg Hx    Stomach cancer Neg Hx    Sleep apnea Neg Hx    Pancreatic cancer Neg Hx    Inflammatory bowel disease Neg Hx    Liver disease Neg Hx     SOCIAL HISTORY: Social History   Socioeconomic History   Marital status: Married    Spouse name: Not on file   Number of children: 2   Years of education: Not on file   Highest education level: Not on file  Occupational History   Occupation: Teacher  Tobacco Use   Smoking status: Some Days    Types: Cigars   Smokeless tobacco: Never   Tobacco comments:     occasional cigar  Vaping Use   Vaping status: Never Used  Substance and Sexual Activity   Alcohol  use: Yes    Alcohol /week: 4.0 standard drinks of alcohol     Types: 4 Glasses of wine per week   Drug use: No   Sexual activity: Not on file  Other Topics Concern   Not on file  Social History Narrative   Not on file   Social Drivers of Health   Financial Resource Strain: Not on file  Food Insecurity: Low Risk  (06/28/2023)   Received from [facility]   Hunger Vital Sign    Within the past 12 months, you worried that your food would run out before you got money to buy more: Never true    Within the past 12 months, the food you bought just didn't last and you didn't have money to get more. : Never true  Transportation Needs: No Transportation Needs (06/28/2023)   Received from [employer]    In the past 12 months, has lack of reliable transportation kept you from medical appointments, meetings, work or from getting things needed for daily living? : No  Physical Activity: Not on file  Stress: Not on file  Social Connections: Not on file  Intimate Partner Violence: Not on file      PHYSICAL EXAM  Vitals:   10/27/23 1000  BP: 132/79  Pulse: 67  Weight: 214 lb 9.6 oz (97.3 kg)  Height: 5' 10 (1.778 m)    Body mass index is 30.79 kg/m.  Generalized: Well developed, in no acute distress  Chest: Lungs clear to auscultation bilaterally  Neurological examination  Mentation: Alert oriented to time, place, history taking. Follows all commands  speech and language fluent Cranial nerve II-XII: Extraocular movements were full, visual field were full on confrontational test Head turning and shoulder shrug  were normal and symmetric.  Gait and station: Gait is normal.    DIAGNOSTIC DATA (LABS, IMAGING, TESTING) - I reviewed patient records, labs, notes, testing and imaging myself where available.  Lab Results  Component Value Date   WBC 5.8 06/24/2023   HGB  14.5 06/24/2023   HCT 42.7 06/24/2023   MCV 84.0 06/24/2023   PLT 191.0 06/24/2023      Component Value Date/Time   NA 142 07/18/2023 0739   K 3.6 07/18/2023 0739   CL 105 07/18/2023 0739   CO2 26 07/18/2023 0739   GLUCOSE 104 (H) 07/18/2023 0739   BUN 17 07/18/2023 0739   CREATININE 1.27 07/18/2023 0739   CALCIUM  8.5 07/18/2023 0739   PROT 6.4 06/24/2023 1404   ALBUMIN 3.8 06/24/2023 1404   AST 19 06/24/2023 1404   ALT 26 06/24/2023 1404   ALKPHOS 45 06/24/2023 1404   BILITOT 0.6 06/24/2023 1404   GFRNONAA 43 (L) 12/02/2022 1601   GFRAA >60 08/17/2016 0809      ASSESSMENT AND PLAN 68 y.o. year old male  has a past medical history of Arthritis, Asthma, Complication of anesthesia (08/17/2016), History of hiatal hernia, Hypertension, Sleep apnea, and Stroke (HCC). here with:  OSA on CPAP  - CPAP compliance excellent - Good treatment of AHI  - Encourage patient to use CPAP nightly and > 4 hours each night - F/U in 1 year or sooner if needed    Duwaine Russell, MSN, NP-C 10/27/2023, 10:05 AM [facility] [address], [address] [place], [place] [address] [phone]

## 2023-10-29 DIAGNOSIS — Z23 Encounter for immunization: Secondary | ICD-10-CM | POA: Diagnosis not present

## 2023-11-10 DIAGNOSIS — J301 Allergic rhinitis due to pollen: Secondary | ICD-10-CM | POA: Diagnosis not present

## 2023-11-10 DIAGNOSIS — J3089 Other allergic rhinitis: Secondary | ICD-10-CM | POA: Diagnosis not present

## 2023-11-10 DIAGNOSIS — J3081 Allergic rhinitis due to animal (cat) (dog) hair and dander: Secondary | ICD-10-CM | POA: Diagnosis not present

## 2023-11-17 DIAGNOSIS — J3081 Allergic rhinitis due to animal (cat) (dog) hair and dander: Secondary | ICD-10-CM | POA: Diagnosis not present

## 2023-11-17 DIAGNOSIS — J3089 Other allergic rhinitis: Secondary | ICD-10-CM | POA: Diagnosis not present

## 2023-11-17 DIAGNOSIS — J301 Allergic rhinitis due to pollen: Secondary | ICD-10-CM | POA: Diagnosis not present

## 2023-11-24 ENCOUNTER — Ambulatory Visit (INDEPENDENT_AMBULATORY_CARE_PROVIDER_SITE_OTHER)

## 2023-11-24 DIAGNOSIS — J3081 Allergic rhinitis due to animal (cat) (dog) hair and dander: Secondary | ICD-10-CM | POA: Diagnosis not present

## 2023-11-24 DIAGNOSIS — J3089 Other allergic rhinitis: Secondary | ICD-10-CM | POA: Diagnosis not present

## 2023-11-24 DIAGNOSIS — I495 Sick sinus syndrome: Secondary | ICD-10-CM

## 2023-11-24 DIAGNOSIS — J301 Allergic rhinitis due to pollen: Secondary | ICD-10-CM | POA: Diagnosis not present

## 2023-11-24 LAB — CUP PACEART REMOTE DEVICE CHECK
Date Time Interrogation Session: 20251029232855
Implantable Pulse Generator Implant Date: 20231219

## 2023-11-25 ENCOUNTER — Ambulatory Visit: Payer: Self-pay | Admitting: Cardiology

## 2023-11-29 DIAGNOSIS — G43909 Migraine, unspecified, not intractable, without status migrainosus: Secondary | ICD-10-CM | POA: Diagnosis not present

## 2023-11-29 DIAGNOSIS — E669 Obesity, unspecified: Secondary | ICD-10-CM | POA: Diagnosis not present

## 2023-11-29 DIAGNOSIS — N401 Enlarged prostate with lower urinary tract symptoms: Secondary | ICD-10-CM | POA: Diagnosis not present

## 2023-11-29 DIAGNOSIS — Z8673 Personal history of transient ischemic attack (TIA), and cerebral infarction without residual deficits: Secondary | ICD-10-CM | POA: Diagnosis not present

## 2023-11-29 DIAGNOSIS — E7800 Pure hypercholesterolemia, unspecified: Secondary | ICD-10-CM | POA: Diagnosis not present

## 2023-11-29 DIAGNOSIS — G4733 Obstructive sleep apnea (adult) (pediatric): Secondary | ICD-10-CM | POA: Diagnosis not present

## 2023-11-29 DIAGNOSIS — E291 Testicular hypofunction: Secondary | ICD-10-CM | POA: Diagnosis not present

## 2023-11-29 DIAGNOSIS — K760 Fatty (change of) liver, not elsewhere classified: Secondary | ICD-10-CM | POA: Diagnosis not present

## 2023-11-30 NOTE — Progress Notes (Signed)
 Remote Loop Recorder Transmission

## 2023-12-01 DIAGNOSIS — J3081 Allergic rhinitis due to animal (cat) (dog) hair and dander: Secondary | ICD-10-CM | POA: Diagnosis not present

## 2023-12-01 DIAGNOSIS — J301 Allergic rhinitis due to pollen: Secondary | ICD-10-CM | POA: Diagnosis not present

## 2023-12-01 DIAGNOSIS — J3089 Other allergic rhinitis: Secondary | ICD-10-CM | POA: Diagnosis not present

## 2023-12-08 DIAGNOSIS — J3089 Other allergic rhinitis: Secondary | ICD-10-CM | POA: Diagnosis not present

## 2023-12-08 DIAGNOSIS — J301 Allergic rhinitis due to pollen: Secondary | ICD-10-CM | POA: Diagnosis not present

## 2023-12-08 DIAGNOSIS — J3081 Allergic rhinitis due to animal (cat) (dog) hair and dander: Secondary | ICD-10-CM | POA: Diagnosis not present

## 2023-12-15 DIAGNOSIS — J3081 Allergic rhinitis due to animal (cat) (dog) hair and dander: Secondary | ICD-10-CM | POA: Diagnosis not present

## 2023-12-15 DIAGNOSIS — J3089 Other allergic rhinitis: Secondary | ICD-10-CM | POA: Diagnosis not present

## 2023-12-15 DIAGNOSIS — J301 Allergic rhinitis due to pollen: Secondary | ICD-10-CM | POA: Diagnosis not present

## 2023-12-25 ENCOUNTER — Ambulatory Visit: Attending: Cardiology

## 2023-12-25 DIAGNOSIS — I495 Sick sinus syndrome: Secondary | ICD-10-CM | POA: Diagnosis not present

## 2023-12-26 LAB — CUP PACEART REMOTE DEVICE CHECK
Date Time Interrogation Session: 20251129232543
Implantable Pulse Generator Implant Date: 20231219

## 2023-12-29 ENCOUNTER — Ambulatory Visit: Payer: Self-pay | Admitting: Cardiology

## 2023-12-29 NOTE — Progress Notes (Signed)
 Remote Loop Recorder Transmission

## 2024-01-09 ENCOUNTER — Other Ambulatory Visit (HOSPITAL_COMMUNITY): Payer: Self-pay | Admitting: Neurology

## 2024-01-09 MED ORDER — STUDY - LIBREXIA-STROKE - MILVEXIAN 25 MG OR PLACEBO TABLET (PI-SETHI): 1.0000 | ORAL_TABLET | Freq: Two times a day (BID) | ORAL | 0 refills | Status: AC

## 2024-01-25 ENCOUNTER — Ambulatory Visit: Attending: Cardiology

## 2024-01-25 DIAGNOSIS — I495 Sick sinus syndrome: Secondary | ICD-10-CM

## 2024-01-26 LAB — CUP PACEART REMOTE DEVICE CHECK
Date Time Interrogation Session: 20251230232107
Implantable Pulse Generator Implant Date: 20231219

## 2024-01-27 ENCOUNTER — Ambulatory Visit: Payer: Self-pay | Admitting: Cardiology

## 2024-01-30 NOTE — Progress Notes (Signed)
 Remote Loop Recorder Transmission

## 2024-02-25 ENCOUNTER — Ambulatory Visit: Attending: Cardiology

## 2024-02-25 DIAGNOSIS — I495 Sick sinus syndrome: Secondary | ICD-10-CM

## 2024-02-27 LAB — CUP PACEART REMOTE DEVICE CHECK
Date Time Interrogation Session: 20260130231512
Implantable Pulse Generator Implant Date: 20231219

## 2024-03-02 NOTE — Progress Notes (Signed)
 Remote Loop Recorder Transmission

## 2024-03-27 ENCOUNTER — Ambulatory Visit

## 2024-03-27 ENCOUNTER — Ambulatory Visit: Admitting: Gastroenterology

## 2024-04-27 ENCOUNTER — Ambulatory Visit

## 2024-05-28 ENCOUNTER — Ambulatory Visit

## 2024-06-28 ENCOUNTER — Ambulatory Visit

## 2024-07-29 ENCOUNTER — Ambulatory Visit

## 2024-08-29 ENCOUNTER — Ambulatory Visit

## 2024-09-29 ENCOUNTER — Ambulatory Visit

## 2024-10-29 ENCOUNTER — Ambulatory Visit: Admitting: Adult Health

## 2024-10-30 ENCOUNTER — Ambulatory Visit

## 2024-11-30 ENCOUNTER — Ambulatory Visit

## 2024-12-31 ENCOUNTER — Ambulatory Visit

## 2025-01-31 ENCOUNTER — Ambulatory Visit
